# Patient Record
Sex: Female | Born: 1975
Health system: Southern US, Community
[De-identification: ages and names within clinical notes are randomized; demographics above are authoritative.]

## PROBLEM LIST (undated history)

## (undated) DIAGNOSIS — I671 Cerebral aneurysm, nonruptured: Secondary | ICD-10-CM

## (undated) DIAGNOSIS — R51 Headache: Secondary | ICD-10-CM

## (undated) DIAGNOSIS — R519 Headache, unspecified: Secondary | ICD-10-CM

## (undated) DIAGNOSIS — Z973 Presence of spectacles and contact lenses: Secondary | ICD-10-CM

## (undated) DIAGNOSIS — Z8249 Family history of ischemic heart disease and other diseases of the circulatory system: Secondary | ICD-10-CM

## (undated) DIAGNOSIS — I10 Essential (primary) hypertension: Secondary | ICD-10-CM

## (undated) HISTORY — DX: Cerebral aneurysm, nonruptured: I67.1

## (undated) HISTORY — DX: Family history of ischemic heart disease and other diseases of the circulatory system: Z82.49

## (undated) HISTORY — PX: OTHER SURGICAL HISTORY: SHX169

## (undated) HISTORY — DX: Headache, unspecified: R51.9

## (undated) HISTORY — DX: Presence of spectacles and contact lenses: Z97.3

## (undated) HISTORY — DX: Headache: R51

---

## 1998-05-29 ENCOUNTER — Emergency Department (HOSPITAL_COMMUNITY): Admission: EM | Admit: 1998-05-29 | Discharge: 1998-05-29 | Payer: Self-pay | Admitting: Emergency Medicine

## 1999-06-29 ENCOUNTER — Emergency Department (HOSPITAL_COMMUNITY): Admission: EM | Admit: 1999-06-29 | Discharge: 1999-06-29 | Payer: Self-pay | Admitting: Internal Medicine

## 2001-09-09 ENCOUNTER — Emergency Department (HOSPITAL_COMMUNITY): Admission: EM | Admit: 2001-09-09 | Discharge: 2001-09-09 | Payer: Self-pay | Admitting: Emergency Medicine

## 2001-10-10 ENCOUNTER — Emergency Department (HOSPITAL_COMMUNITY): Admission: EM | Admit: 2001-10-10 | Discharge: 2001-10-10 | Payer: Self-pay | Admitting: Emergency Medicine

## 2001-10-10 ENCOUNTER — Encounter: Payer: Self-pay | Admitting: Emergency Medicine

## 2001-12-04 ENCOUNTER — Emergency Department (HOSPITAL_COMMUNITY): Admission: EM | Admit: 2001-12-04 | Discharge: 2001-12-04 | Payer: Self-pay | Admitting: Emergency Medicine

## 2001-12-04 ENCOUNTER — Encounter: Payer: Self-pay | Admitting: Emergency Medicine

## 2005-06-13 ENCOUNTER — Emergency Department (HOSPITAL_COMMUNITY): Admission: EM | Admit: 2005-06-13 | Discharge: 2005-06-13 | Payer: Self-pay | Admitting: Emergency Medicine

## 2007-04-14 ENCOUNTER — Emergency Department (HOSPITAL_COMMUNITY): Admission: EM | Admit: 2007-04-14 | Discharge: 2007-04-14 | Payer: Self-pay | Admitting: Emergency Medicine

## 2007-04-17 ENCOUNTER — Emergency Department (HOSPITAL_COMMUNITY): Admission: EM | Admit: 2007-04-17 | Discharge: 2007-04-17 | Payer: Self-pay | Admitting: Emergency Medicine

## 2008-11-03 ENCOUNTER — Emergency Department (HOSPITAL_COMMUNITY): Admission: EM | Admit: 2008-11-03 | Discharge: 2008-11-03 | Payer: Self-pay | Admitting: Emergency Medicine

## 2009-08-16 ENCOUNTER — Emergency Department (HOSPITAL_COMMUNITY): Admission: EM | Admit: 2009-08-16 | Discharge: 2009-08-16 | Payer: Self-pay | Admitting: Emergency Medicine

## 2010-01-21 ENCOUNTER — Emergency Department (HOSPITAL_COMMUNITY)
Admission: EM | Admit: 2010-01-21 | Discharge: 2010-01-21 | Payer: Self-pay | Source: Home / Self Care | Admitting: Diagnostic Radiology

## 2011-03-24 ENCOUNTER — Encounter: Payer: Self-pay | Admitting: Emergency Medicine

## 2011-03-24 ENCOUNTER — Emergency Department (HOSPITAL_COMMUNITY)
Admission: EM | Admit: 2011-03-24 | Discharge: 2011-03-24 | Disposition: A | Discharge status: Home / Self Care | Payer: 59 | Attending: Emergency Medicine | Admitting: Emergency Medicine

## 2011-03-24 DIAGNOSIS — L0231 Cutaneous abscess of buttock: Secondary | ICD-10-CM | POA: Insufficient documentation

## 2011-03-24 DIAGNOSIS — L0291 Cutaneous abscess, unspecified: Secondary | ICD-10-CM

## 2011-03-24 MED ORDER — OXYCODONE-ACETAMINOPHEN 5-325 MG PO TABS
1.0000 | ORAL_TABLET | Freq: Four times a day (QID) | ORAL | Status: AC | PRN
Start: 2011-03-24 — End: 2011-04-03

## 2011-03-24 MED ORDER — LIDOCAINE HCL (PF) 1 % IJ SOLN: 10.0000 mL | Freq: Once | INTRAMUSCULAR | Status: DC

## 2011-03-24 MED ORDER — LIDOCAINE HCL (CARDIAC) 20 MG/ML IV SOLN: 10.0000 mL | Freq: Once | INTRAVENOUS | Status: DC

## 2011-03-24 MED ORDER — SULFAMETHOXAZOLE-TRIMETHOPRIM 800-160 MG PO TABS: 1.0000 | ORAL_TABLET | Freq: Two times a day (BID) | ORAL | Status: AC

## 2011-03-24 NOTE — ED Provider Notes (Signed)
History     CSN: [id number] Arrival date & time: 03/24/2011  7:11 PM   First MD Initiated Contact with Patient 03/24/11 2116      Chief Complaint  Patient presents with  . Abscess    (Consider location/radiation/quality/duration/timing/severity/associated sxs/prior treatment) Patient is a 35 y.o. female presenting with abscess. The history is provided by the patient.  Abscess  This is a new problem. The current episode started less than one week ago. The onset was gradual. The problem occurs continuously. The problem has been gradually worsening. The abscess is present on the left buttock. The problem is severe. The abscess is characterized by painfulness and burning. The abscess first occurred at home. Pertinent negatives include no fever. There were no sick contacts. She has received no recent medical care.    Past Medical History  Diagnosis Date  . Asthma     Past Surgical History  Procedure Date  . Arthroscopic knee     Family History  Problem Relation Age of Onset  . Hypertension Mother   . Hyperlipidemia Father   . Hypertension Other     History  Substance Use Topics  . Smoking status: Never Smoker   . Smokeless tobacco: Not on file  . Alcohol Use: Yes     social    OB History    Grav Para Term Preterm Abortions TAB SAB Ect Mult Living                  Review of Systems  Constitutional: Negative for fever.  All other systems reviewed and are negative.    Allergies  Review of patient's allergies indicates no known allergies.  Home Medications   Current Outpatient Rx  Name Route Sig Dispense Refill  . DICLOFENAC SODIUM 75 MG PO TBEC Oral Take 75 mg by mouth 2 (two) times daily as needed. For headache       BP 136/84  Pulse 110  Temp(Src) 98.3 F (36.8 C) (Oral)  Resp 18  SpO2 100%  LMP 03/05/2011  Physical Exam  Constitutional: She is oriented to person, place, and time. She appears well-developed and well-nourished. She appears  distressed.  HENT:  Head: Normocephalic and atraumatic.  Eyes: EOM are normal. Pupils are equal, round, and reactive to light.  Musculoskeletal:       Back:  Neurological: She is alert and oriented to person, place, and time.  Skin: Skin is warm and dry.    ED Course  Procedures (including critical care time)  INCISION AND DRAINAGE Performed by: Gwyneth Sprout Consent: Verbal consent obtained. Risks and benefits: risks, benefits and alternatives were discussed Type: abscess  Body area: left buttocks  Anesthesia: local infiltration  Local anesthetic: lidocaine 1% with epinephrine  Anesthetic total: 5 ml  Complexity: complex Blunt dissection to break up loculations  Drainage: purulent  Drainage amount: 6mL  Packing material: 1/4 in iodoform gauze  Patient tolerance: Patient tolerated the procedure well with no immediate complications.     No diagnosis found.    MDM   Pt with abscess of the buttocks without rectal involvement.  Denies fever no systemic sx.  No other complaints.  Abscess drained and packed.  Will treat with abx and soaks.        Gwyneth Sprout, MD 03/24/11 2220

## 2011-03-24 NOTE — ED Notes (Signed)
Pt states she has a abscess on her right buttock.  Pt states it started on Tuesday and has progressively gotten worse

## 2011-08-03 ENCOUNTER — Other Ambulatory Visit: Payer: Self-pay | Admitting: Radiology

## 2012-10-21 ENCOUNTER — Encounter (HOSPITAL_COMMUNITY): Payer: Self-pay | Admitting: *Deleted

## 2012-10-21 ENCOUNTER — Inpatient Hospital Stay (HOSPITAL_COMMUNITY)
Admission: EM | Admit: 2012-10-21 | Discharge: 2012-11-02 | DRG: 064 | Disposition: A | Discharge status: Home / Self Care | Payer: 59 | Attending: Neurosurgery | Admitting: Neurosurgery

## 2012-10-21 DIAGNOSIS — J4520 Mild intermittent asthma, uncomplicated: Secondary | ICD-10-CM

## 2012-10-21 DIAGNOSIS — Z7982 Long term (current) use of aspirin: Secondary | ICD-10-CM

## 2012-10-21 DIAGNOSIS — R51 Headache: Secondary | ICD-10-CM | POA: Diagnosis present

## 2012-10-21 DIAGNOSIS — E874 Mixed disorder of acid-base balance: Secondary | ICD-10-CM | POA: Diagnosis present

## 2012-10-21 DIAGNOSIS — E872 Acidosis, unspecified: Secondary | ICD-10-CM

## 2012-10-21 DIAGNOSIS — E8729 Other acidosis: Secondary | ICD-10-CM | POA: Diagnosis present

## 2012-10-21 DIAGNOSIS — J45909 Unspecified asthma, uncomplicated: Secondary | ICD-10-CM | POA: Diagnosis present

## 2012-10-21 DIAGNOSIS — R519 Headache, unspecified: Secondary | ICD-10-CM | POA: Diagnosis present

## 2012-10-21 DIAGNOSIS — R7309 Other abnormal glucose: Secondary | ICD-10-CM | POA: Diagnosis present

## 2012-10-21 DIAGNOSIS — I609 Nontraumatic subarachnoid hemorrhage, unspecified: Principal | ICD-10-CM

## 2012-10-21 DIAGNOSIS — J95821 Acute postprocedural respiratory failure: Secondary | ICD-10-CM

## 2012-10-21 DIAGNOSIS — R58 Hemorrhage, not elsewhere classified: Secondary | ICD-10-CM

## 2012-10-21 DIAGNOSIS — E871 Hypo-osmolality and hyponatremia: Secondary | ICD-10-CM | POA: Diagnosis present

## 2012-10-21 DIAGNOSIS — H53149 Visual discomfort, unspecified: Secondary | ICD-10-CM | POA: Diagnosis present

## 2012-10-21 DIAGNOSIS — R739 Hyperglycemia, unspecified: Secondary | ICD-10-CM

## 2012-10-21 DIAGNOSIS — E876 Hypokalemia: Secondary | ICD-10-CM

## 2012-10-21 LAB — CG4 I-STAT (LACTIC ACID): Lactic Acid, Venous: 4.45 mmol/L — ABNORMAL HIGH (ref 0.5–2.2)

## 2012-10-21 LAB — COMPREHENSIVE METABOLIC PANEL
ALT: 27 U/L (ref 0–35)
AST: 22 U/L (ref 0–37)
Calcium: 10.1 mg/dL (ref 8.4–10.5)
Creatinine, Ser: 0.72 mg/dL (ref 0.50–1.10)
GFR calc Af Amer: >90 mL/min (ref >90)
Glucose, Bld: 177 mg/dL — ABNORMAL HIGH (ref 70–99)
Sodium: 134 mEq/L — ABNORMAL LOW (ref 135–145)
Total Protein: 8.3 g/dL (ref 6.0–8.3)

## 2012-10-21 LAB — CBC WITH DIFFERENTIAL/PLATELET
Basophils Absolute: 0 10*3/uL (ref 0.0–0.1)
Eosinophils Absolute: 0.2 10*3/uL (ref 0.0–0.7)
Eosinophils Relative: 2 % (ref 0–5)
MCH: 29.2 pg (ref 26.0–34.0)
MCV: 86.7 fL (ref 78.0–100.0)
Platelets: 269 10*3/uL (ref 150–400)
RDW: 13.5 % (ref 11.5–15.5)

## 2012-10-21 MED ORDER — SODIUM CHLORIDE 0.9 % IV SOLN
1000.0000 mL | INTRAVENOUS | Status: DC
  Administered 2012-10-21 – 2012-10-22 (×2): 1000 mL via INTRAVENOUS

## 2012-10-21 MED ORDER — ONDANSETRON 8 MG PO TBDP
8.0000 mg | ORAL_TABLET | Freq: Once | ORAL | Status: AC
  Administered 2012-10-21: 8 mg via ORAL
  Filled 2012-10-21: qty 1

## 2012-10-21 MED ORDER — POTASSIUM CHLORIDE 10 MEQ/100ML IV SOLN
10.0000 meq | Freq: Once | INTRAVENOUS | Status: AC
  Administered 2012-10-21: 10 meq via INTRAVENOUS
  Filled 2012-10-21: qty 100

## 2012-10-21 MED ORDER — SODIUM CHLORIDE 0.9 % IV SOLN
1000.0000 mL | Freq: Once | INTRAVENOUS | Status: AC
  Administered 2012-10-21: 1000 mL via INTRAVENOUS

## 2012-10-21 MED ORDER — FENTANYL CITRATE 0.05 MG/ML IJ SOLN
50.0000 ug | Freq: Once | INTRAMUSCULAR | Status: AC
  Administered 2012-10-21: 50 ug via NASAL
  Filled 2012-10-21: qty 2

## 2012-10-21 MED ORDER — DIPHENHYDRAMINE HCL 50 MG/ML IJ SOLN
25.0000 mg | Freq: Once | INTRAMUSCULAR | Status: AC
  Administered 2012-10-21: 25 mg via INTRAVENOUS
  Filled 2012-10-21: qty 1

## 2012-10-21 MED ORDER — METOCLOPRAMIDE HCL 5 MG/ML IJ SOLN
10.0000 mg | Freq: Once | INTRAMUSCULAR | Status: AC
  Administered 2012-10-21: 10 mg via INTRAVENOUS
  Filled 2012-10-21: qty 2

## 2012-10-21 NOTE — ED Notes (Signed)
Pt report sudden onset of headache that began around 1930 this evening; pt states that it is a sharp pain in the back of her neck that radiates through her eyes; pt denies hx of migraines; pt hyperventilating upon RN assessment; pt advised of slow even breaths and not to hyperventilate; pt c/o feeling dizzy and blurry vision; pt c/o N/V

## 2012-10-21 NOTE — ED Provider Notes (Signed)
History     CSN: [id number]  Arrival date & time 10/21/12  2044   First MD Initiated Contact with Patient 10/21/12 2251      Chief Complaint  Patient presents with  . Headache    (Consider location/radiation/quality/duration/timing/severity/associated sxs/prior treatment) Patient is a 37 y.o. female presenting with headaches. The history is provided by the patient.  Headache She had onset at about 7:30 PM of a sharp pain in the back of her neck which radiated up through her head. Pain does wax and wane. It is severe and she rates it at 9/10. Nothing makes it better nothing makes it worse. She denies photophobia or phonophobia. She denies blurry or double vision. She has noted that she's feeling lightheaded and mouth is dry. She denies weakness or numbness or tingling. She's never had a headache like this before. She has not had anything to try and treat it.  Past Medical History  Diagnosis Date  . Asthma     Past Surgical History  Procedure Laterality Date  . Arthroscopic knee      Family History  Problem Relation Age of Onset  . Hypertension Mother   . Hyperlipidemia Father   . Hypertension Other     History  Substance Use Topics  . Smoking status: Never Smoker   . Smokeless tobacco: Not on file  . Alcohol Use: Yes     Comment: social    OB History   Grav Para Term Preterm Abortions TAB SAB Ect Mult Living                  Review of Systems  Neurological: Positive for headaches.  All other systems reviewed and are negative.    Allergies  Review of patient's allergies indicates no known allergies.  Home Medications  No current outpatient prescriptions on file.  BP 151/103  Pulse 92  Temp(Src) 97.4 F (36.3 C) (Oral)  Resp 28  SpO2 100%  LMP 10/20/2012  Physical Exam  Nursing note and vitals reviewed.  37 year old female, who appears uncomfortable, but is in no acute distress. She appears to be hyperventilating. Vital signs are significant for  tachypnea with respiratory rate of 28, and hypertension with blood pressure 151/103. Oxygen saturation is 100%, which is normal. Head is normocephalic and atraumatic. PERRLA, EOMI. Oropharynx is clear. Fundi show no hemorrhage, exudate, or papilledema. Neck is moderately tender diffusely in the posterior aspect with moderate to severe bilateral paracervical spasm. In spite of this, it is she's able to put the neck through full range of motion. Back is nontender and there is no CVA tenderness. Lungs are clear without rales, wheezes, or rhonchi. Chest is nontender. Heart has regular rate and rhythm without murmur. Abdomen is soft, flat, nontender without masses or hepatosplenomegaly and peristalsis is normoactive. Extremities have no cyanosis or edema, full range of motion is present. Skin is warm and dry without rash. Neurologic: Mental status is normal, cranial nerves are intact, there are no motor or sensory deficits.  ED Course  Procedures (including critical care time)  Results for orders placed during the hospital encounter of 10/21/12  CBC WITH DIFFERENTIAL      Result Value Range   WBC 10.3  4.0 - 10.5 K/uL   RBC 4.52  3.87 - 5.11 MIL/uL   Hemoglobin 13.2  12.0 - 15.0 g/dL   HCT 28.4  13.2 - 44.0 %   MCV 86.7  78.0 - 100.0 fL   MCH 29.2  26.0 -  34.0 pg   MCHC 33.7  30.0 - 36.0 g/dL   RDW 40.9  81.1 - 91.4 %   Platelets 269  150 - 400 K/uL   Neutrophils Relative % 45  43 - 77 %   Neutro Abs 4.6  1.7 - 7.7 K/uL   Lymphocytes Relative 46  12 - 46 %   Lymphs Abs 4.7 (*) 0.7 - 4.0 K/uL   Monocytes Relative 8  3 - 12 %   Monocytes Absolute 0.8  0.1 - 1.0 K/uL   Eosinophils Relative 2  0 - 5 %   Eosinophils Absolute 0.2  0.0 - 0.7 K/uL   Basophils Relative 0  0 - 1 %   Basophils Absolute 0.0  0.0 - 0.1 K/uL  COMPREHENSIVE METABOLIC PANEL      Result Value Range   Sodium 134 (*) 135 - 145 mEq/L   Potassium 2.9 (*) 3.5 - 5.1 mEq/L   Chloride 98  96 - 112 mEq/L   CO2 17 (*) 19 -  32 mEq/L   Glucose, Bld 177 (*) 70 - 99 mg/dL   BUN 10  6 - 23 mg/dL   Creatinine, Ser 7.82  0.50 - 1.10 mg/dL   Calcium 95.6  8.4 - 21.3 mg/dL   Total Protein 8.3  6.0 - 8.3 g/dL   Albumin 4.2  3.5 - 5.2 g/dL   AST 22  0 - 37 U/L   ALT 27  0 - 35 U/L   Alkaline Phosphatase 85  39 - 117 U/L   Total Bilirubin 0.2 (*) 0.3 - 1.2 mg/dL   GFR calc non Af Amer >90  >90 mL/min   GFR calc Af Amer >90  >90 mL/min  ACETAMINOPHEN LEVEL      Result Value Range   Acetaminophen (Tylenol), Serum <15.0  10 - 30 ug/mL  SALICYLATE LEVEL      Result Value Range   Salicylate Lvl 2.3 (*) 2.8 - 20.0 mg/dL  URINALYSIS, ROUTINE W REFLEX MICROSCOPIC      Result Value Range   Color, Urine YELLOW  YELLOW   APPearance HAZY (*) CLEAR   Specific Gravity, Urine 1.011  1.005 - 1.030   pH 7.5  5.0 - 8.0   Glucose, UA NEGATIVE  NEGATIVE mg/dL   Hgb urine dipstick LARGE (*) NEGATIVE   Bilirubin Urine NEGATIVE  NEGATIVE   Ketones, ur NEGATIVE  NEGATIVE mg/dL   Protein, ur NEGATIVE  NEGATIVE mg/dL   Urobilinogen, UA 0.2  0.0 - 1.0 mg/dL   Nitrite NEGATIVE  NEGATIVE   Leukocytes, UA NEGATIVE  NEGATIVE  URINE MICROSCOPIC-ADD ON      Result Value Range   Squamous Epithelial / LPF RARE  RARE   WBC, UA 0-2  <3 WBC/hpf   RBC / HPF TOO NUMEROUS TO COUNT  <3 RBC/hpf   Bacteria, UA RARE  RARE   Urine-Other TRICHOMONAS PRESENT    CG4 I-STAT (LACTIC ACID)      Result Value Range   Lactic Acid, Venous 4.45 (*) 0.5 - 2.2 mmol/L  POCT PREGNANCY, URINE      Result Value Range   Preg Test, Ur NEGATIVE  NEGATIVE  CG4 I-STAT (LACTIC ACID)      Result Value Range   Lactic Acid, Venous 3.96 (*) 0.5 - 2.2 mmol/L    1. Lactic acidosis   2. Hyperglycemia   3. Headache   4. Hypokalemia       MDM  Neck pain and headache of uncertain  cause. She will be given a therapeutic trial of a headache cocktail. Laboratory workup has been ordered prior to my seeing the patient and has come back significant for hypokalemia with  potassium 2.9, metabolic acidosis with CO2 of 17, borderline hyponatremia with sodium of 134. Anion gap is elevated at 19. Hyperglycemia is also noted to with blood sugar 177. Causes for her metabolic abnormalities is not clear and additional testing has been initiated. Potassium will be given intravenously.  12:20 AM Headache is significantly improved. Lactic acid has come back elevated. Reason for lactic acidosis is not clear, but elevated lactic acid is sufficient to account for abnormalities and an anion gap. She received a 1 L bolus of normal saline as part of the headache cocktail and she will be given a second 1 L bolus. Of note, she is not febrile, does not appear toxic and this does not appear to be part of a sepsis picture.   2:45 AM Headache is starting to recur and nausea starting to recur. She'll be given ketorolac and ondansetron. Lactic acid level be repeated to see if it has improved with IV hydration.   Repeat lactic acid level has shown only slight drop. She will be admitted for investigation of the cause of her lactic acidosis. Case is discussed with Dr.Garg of [facility] who agrees to admit the patient under observation status.   Dione Booze, MD 10/22/12 [phone]

## 2012-10-22 ENCOUNTER — Inpatient Hospital Stay (HOSPITAL_COMMUNITY): Payer: 59

## 2012-10-22 ENCOUNTER — Inpatient Hospital Stay (HOSPITAL_COMMUNITY): Payer: 59 | Admitting: Anesthesiology

## 2012-10-22 ENCOUNTER — Encounter (HOSPITAL_COMMUNITY): Payer: Self-pay | Admitting: Anesthesiology

## 2012-10-22 ENCOUNTER — Encounter (HOSPITAL_COMMUNITY): Payer: Self-pay | Admitting: Radiology

## 2012-10-22 ENCOUNTER — Observation Stay (HOSPITAL_COMMUNITY): Payer: 59

## 2012-10-22 DIAGNOSIS — J95821 Acute postprocedural respiratory failure: Secondary | ICD-10-CM

## 2012-10-22 DIAGNOSIS — E876 Hypokalemia: Secondary | ICD-10-CM

## 2012-10-22 DIAGNOSIS — J45909 Unspecified asthma, uncomplicated: Secondary | ICD-10-CM

## 2012-10-22 DIAGNOSIS — I609 Nontraumatic subarachnoid hemorrhage, unspecified: Principal | ICD-10-CM | POA: Diagnosis present

## 2012-10-22 DIAGNOSIS — E872 Acidosis, unspecified: Secondary | ICD-10-CM

## 2012-10-22 DIAGNOSIS — R519 Headache, unspecified: Secondary | ICD-10-CM | POA: Diagnosis present

## 2012-10-22 DIAGNOSIS — R7309 Other abnormal glucose: Secondary | ICD-10-CM

## 2012-10-22 DIAGNOSIS — E8729 Other acidosis: Secondary | ICD-10-CM | POA: Diagnosis present

## 2012-10-22 LAB — URINALYSIS, ROUTINE W REFLEX MICROSCOPIC
Bilirubin Urine: NEGATIVE
Glucose, UA: NEGATIVE mg/dL
Hgb urine dipstick: NEGATIVE
Ketones, ur: NEGATIVE mg/dL
Leukocytes, UA: NEGATIVE
Nitrite: NEGATIVE
Protein, ur: NEGATIVE mg/dL
Protein, ur: NEGATIVE mg/dL
Urobilinogen, UA: 0.2 mg/dL (ref 0.0–1.0)

## 2012-10-22 LAB — URINE MICROSCOPIC-ADD ON

## 2012-10-22 LAB — BASIC METABOLIC PANEL
CO2: 21 mEq/L (ref 19–32)
Chloride: 109 mEq/L (ref 96–112)
Glucose, Bld: 123 mg/dL — ABNORMAL HIGH (ref 70–99)
Sodium: 139 mEq/L (ref 135–145)

## 2012-10-22 LAB — GLUCOSE, CAPILLARY: Glucose-Capillary: 119 mg/dL — ABNORMAL HIGH (ref 70–99)

## 2012-10-22 LAB — SALICYLATE LEVEL: Salicylate Lvl: 2.3 mg/dL — ABNORMAL LOW (ref 2.8–20.0)

## 2012-10-22 LAB — MRSA PCR SCREENING: MRSA by PCR: NEGATIVE

## 2012-10-22 LAB — MAGNESIUM: Magnesium: 2 mg/dL (ref 1.5–2.5)

## 2012-10-22 LAB — PROTIME-INR
INR: 1 (ref 0.00–1.49)
Prothrombin Time: 13.1 seconds (ref 11.6–15.2)

## 2012-10-22 LAB — PHOSPHORUS: Phosphorus: 2.2 mg/dL — ABNORMAL LOW (ref 2.3–4.6)

## 2012-10-22 MED ORDER — FENTANYL CITRATE 0.05 MG/ML IJ SOLN
INTRAMUSCULAR | Status: DC | PRN
  Administered 2012-10-22: 100 ug via INTRAVENOUS
  Administered 2012-10-22: 125 ug via INTRAVENOUS
  Administered 2012-10-22: 100 ug via INTRAVENOUS
  Administered 2012-10-22: 25 ug via INTRAVENOUS
  Administered 2012-10-22: 50 ug via INTRAVENOUS
  Administered 2012-10-22: 75 ug via INTRAVENOUS

## 2012-10-22 MED ORDER — MORPHINE SULFATE 2 MG/ML IJ SOLN: 2.0000 mg | INTRAMUSCULAR | Status: DC | PRN

## 2012-10-22 MED ORDER — ACETAMINOPHEN 500 MG PO TABS
1000.0000 mg | ORAL_TABLET | Freq: Four times a day (QID) | ORAL | Status: DC | PRN
  Administered 2012-10-24 – 2012-10-25 (×2): 1000 mg via ORAL
  Filled 2012-10-22 (×4): qty 2

## 2012-10-22 MED ORDER — HYDROMORPHONE HCL PF 1 MG/ML IJ SOLN: 0.2500 mg | INTRAMUSCULAR | Status: DC | PRN

## 2012-10-22 MED ORDER — PROPOFOL 10 MG/ML IV BOLUS
INTRAVENOUS | Status: DC | PRN
  Administered 2012-10-22: 200 mg via INTRAVENOUS

## 2012-10-22 MED ORDER — LIDOCAINE HCL (CARDIAC) 20 MG/ML IV SOLN
INTRAVENOUS | Status: DC | PRN
  Administered 2012-10-22: 100 mg via INTRAVENOUS

## 2012-10-22 MED ORDER — ONDANSETRON HCL 4 MG/2ML IJ SOLN: 4.0000 mg | Freq: Three times a day (TID) | INTRAMUSCULAR | Status: DC | PRN

## 2012-10-22 MED ORDER — ONDANSETRON HCL 4 MG/2ML IJ SOLN
4.0000 mg | Freq: Four times a day (QID) | INTRAMUSCULAR | Status: DC | PRN
  Administered 2012-10-23 – 2012-10-30 (×13): 4 mg via INTRAVENOUS
  Filled 2012-10-22 (×13): qty 2

## 2012-10-22 MED ORDER — ARTIFICIAL TEARS OP OINT
TOPICAL_OINTMENT | OPHTHALMIC | Status: DC | PRN
  Administered 2012-10-22: 1 via OPHTHALMIC

## 2012-10-22 MED ORDER — NIMODIPINE 60 MG/20ML PO SOLN
60.0000 mg | ORAL | Status: DC
  Administered 2012-10-22 – 2012-11-02 (×62): 60 mg
  Filled 2012-10-22 (×75): qty 20

## 2012-10-22 MED ORDER — SENNOSIDES-DOCUSATE SODIUM 8.6-50 MG PO TABS
1.0000 | ORAL_TABLET | Freq: Two times a day (BID) | ORAL | Status: DC
  Administered 2012-10-22 – 2012-10-31 (×4): 1 via ORAL
  Filled 2012-10-22 (×19): qty 1

## 2012-10-22 MED ORDER — HYDRALAZINE HCL 20 MG/ML IJ SOLN
INTRAMUSCULAR | Status: AC
  Filled 2012-10-22: qty 1

## 2012-10-22 MED ORDER — ONDANSETRON HCL 4 MG/2ML IJ SOLN: 4.0000 mg | Freq: Once | INTRAMUSCULAR | Status: AC | PRN

## 2012-10-22 MED ORDER — MIDAZOLAM HCL 2 MG/2ML IJ SOLN
INTRAMUSCULAR | Status: AC | PRN
  Administered 2012-10-22: 1 mg via INTRAVENOUS

## 2012-10-22 MED ORDER — POTASSIUM CHLORIDE IN NACL 20-0.9 MEQ/L-% IV SOLN
INTRAVENOUS | Status: DC
  Administered 2012-10-22 – 2012-10-25 (×5): via INTRAVENOUS
  Administered 2012-10-26: 1000 mL via INTRAVENOUS
  Administered 2012-10-26 – 2012-11-01 (×7): via INTRAVENOUS
  Filled 2012-10-22 (×47): qty 1000

## 2012-10-22 MED ORDER — SODIUM CHLORIDE 0.9 % IV SOLN: INTRAVENOUS | Status: DC

## 2012-10-22 MED ORDER — CHLORHEXIDINE GLUCONATE 0.12 % MT SOLN
15.0000 mL | Freq: Two times a day (BID) | OROMUCOSAL | Status: DC
  Administered 2012-10-22 – 2012-10-24 (×4): 15 mL via OROMUCOSAL
  Filled 2012-10-22 (×3): qty 15

## 2012-10-22 MED ORDER — SODIUM CHLORIDE 0.9 % IV SOLN
25.0000 ug/h | INTRAVENOUS | Status: DC
  Administered 2012-10-22: 50 ug/h via INTRAVENOUS
  Filled 2012-10-22 (×2): qty 50

## 2012-10-22 MED ORDER — IOHEXOL 300 MG/ML  SOLN
150.0000 mL | Freq: Once | INTRAMUSCULAR | Status: AC | PRN
  Administered 2012-10-22: 160 mL via INTRA_ARTERIAL

## 2012-10-22 MED ORDER — ACETAMINOPHEN 650 MG RE SUPP: 650.0000 mg | RECTAL | Status: DC | PRN

## 2012-10-22 MED ORDER — CEFAZOLIN SODIUM-DEXTROSE 2-3 GM-% IV SOLR
2.0000 g | Freq: Once | INTRAVENOUS | Status: AC
  Administered 2012-10-22: 2 g via INTRAVENOUS

## 2012-10-22 MED ORDER — SODIUM CHLORIDE 0.9 % IJ SOLN: 3.0000 mL | INTRAMUSCULAR | Status: DC | PRN

## 2012-10-22 MED ORDER — NIMODIPINE 30 MG PO CAPS
60.0000 mg | ORAL_CAPSULE | ORAL | Status: DC
  Administered 2012-11-01 (×2): 60 mg via ORAL
  Filled 2012-10-22 (×75): qty 2

## 2012-10-22 MED ORDER — LACTATED RINGERS IV SOLN
INTRAVENOUS | Status: DC | PRN
  Administered 2012-10-22 (×2): via INTRAVENOUS

## 2012-10-22 MED ORDER — CLOPIDOGREL BISULFATE 75 MG PO TABS
75.0000 mg | ORAL_TABLET | Freq: Every day | ORAL | Status: DC
  Administered 2012-10-23 – 2012-11-02 (×11): 75 mg via ORAL
  Filled 2012-10-22 (×14): qty 1

## 2012-10-22 MED ORDER — ACETAMINOPHEN 325 MG PO TABS: 650.0000 mg | ORAL_TABLET | ORAL | Status: DC | PRN

## 2012-10-22 MED ORDER — CLOPIDOGREL BISULFATE 300 MG PO TABS
300.0000 mg | ORAL_TABLET | Freq: Once | ORAL | Status: AC
  Administered 2012-10-22: 300 mg via ORAL
  Filled 2012-10-22: qty 1

## 2012-10-22 MED ORDER — SUCCINYLCHOLINE CHLORIDE 20 MG/ML IJ SOLN
INTRAMUSCULAR | Status: DC | PRN
  Administered 2012-10-22: 100 mg via INTRAVENOUS

## 2012-10-22 MED ORDER — PROPOFOL 10 MG/ML IV EMUL
5.0000 ug/kg/min | INTRAVENOUS | Status: DC
  Administered 2012-10-22: 50 ug/kg/min via INTRAVENOUS
  Administered 2012-10-22: 75 ug/kg/min via INTRAVENOUS
  Administered 2012-10-23: 50 ug/kg/min via INTRAVENOUS
  Filled 2012-10-22 (×5): qty 100

## 2012-10-22 MED ORDER — SODIUM CHLORIDE 0.9 % IJ SOLN: 3.0000 mL | Freq: Two times a day (BID) | INTRAMUSCULAR | Status: DC

## 2012-10-22 MED ORDER — PANTOPRAZOLE SODIUM 40 MG IV SOLR
40.0000 mg | Freq: Every day | INTRAVENOUS | Status: DC
  Administered 2012-10-22 – 2012-10-25 (×4): 40 mg via INTRAVENOUS
  Filled 2012-10-22 (×5): qty 40

## 2012-10-22 MED ORDER — HYDROMORPHONE HCL PF 1 MG/ML IJ SOLN
0.5000 mg | INTRAMUSCULAR | Status: DC | PRN
  Administered 2012-10-22 – 2012-10-24 (×7): 0.5 mg via INTRAVENOUS
  Administered 2012-10-24: 09:00:00 via INTRAVENOUS
  Administered 2012-10-24 – 2012-10-28 (×25): 0.5 mg via INTRAVENOUS
  Administered 2012-10-29: 08:00:00 via INTRAVENOUS
  Administered 2012-10-29 (×2): 0.5 mg via INTRAVENOUS
  Administered 2012-10-29: 18:00:00 via INTRAVENOUS
  Administered 2012-10-29 – 2012-11-01 (×17): 0.5 mg via INTRAVENOUS
  Filled 2012-10-22 (×53): qty 1

## 2012-10-22 MED ORDER — EPTIFIBATIDE 2 MG/ML IV SOLN
INTRAVENOUS | Status: AC
  Administered 2012-10-22: 9.6 mg
  Filled 2012-10-22: qty 10

## 2012-10-22 MED ORDER — NICARDIPINE HCL IN NACL 20-0.86 MG/200ML-% IV SOLN
5.0000 mg/h | INTRAVENOUS | Status: DC
  Administered 2012-10-23: 5 mg/h via INTRAVENOUS
  Administered 2012-10-23: 7 mg/h via INTRAVENOUS
  Administered 2012-10-23 (×3): 5 mg/h via INTRAVENOUS
  Administered 2012-10-23: 7 mg/h via INTRAVENOUS
  Administered 2012-10-24: 5 mg/h via INTRAVENOUS
  Filled 2012-10-22 (×7): qty 200

## 2012-10-22 MED ORDER — FENTANYL CITRATE 0.05 MG/ML IJ SOLN
INTRAMUSCULAR | Status: AC | PRN
  Administered 2012-10-22: 25 ug via INTRAVENOUS

## 2012-10-22 MED ORDER — PHENYLEPHRINE HCL 10 MG/ML IJ SOLN
10.0000 mg | INTRAVENOUS | Status: DC | PRN
  Administered 2012-10-22: 10 ug/min via INTRAVENOUS

## 2012-10-22 MED ORDER — HYDROMORPHONE HCL PF 1 MG/ML IJ SOLN
1.0000 mg | INTRAMUSCULAR | Status: DC | PRN
  Administered 2012-10-22: 1 mg via INTRAVENOUS
  Filled 2012-10-22: qty 1

## 2012-10-22 MED ORDER — SODIUM CHLORIDE 0.9 % IV SOLN
500.0000 mg | Freq: Two times a day (BID) | INTRAVENOUS | Status: DC
  Administered 2012-10-22 – 2012-11-01 (×21): 500 mg via INTRAVENOUS
  Filled 2012-10-22 (×24): qty 5

## 2012-10-22 MED ORDER — HEPARIN (PORCINE) IN NACL 100-0.45 UNIT/ML-% IJ SOLN
700.0000 [IU]/h | INTRAMUSCULAR | Status: AC
  Administered 2012-10-22: 500 [IU]/h via INTRAVENOUS
  Filled 2012-10-22 (×2): qty 250

## 2012-10-22 MED ORDER — POTASSIUM CHLORIDE 10 MEQ/100ML IV SOLN
10.0000 meq | Freq: Once | INTRAVENOUS | Status: AC
  Administered 2012-10-22: 10 meq via INTRAVENOUS
  Filled 2012-10-22: qty 100

## 2012-10-22 MED ORDER — PROPOFOL INFUSION 10 MG/ML OPTIME
INTRAVENOUS | Status: DC | PRN
  Administered 2012-10-22: 50 ug/kg/min via INTRAVENOUS

## 2012-10-22 MED ORDER — MIDAZOLAM HCL 2 MG/2ML IJ SOLN
INTRAMUSCULAR | Status: AC
  Filled 2012-10-22: qty 2

## 2012-10-22 MED ORDER — FENTANYL BOLUS VIA INFUSION
25.0000 ug | Freq: Four times a day (QID) | INTRAVENOUS | Status: DC | PRN
  Filled 2012-10-22: qty 100

## 2012-10-22 MED ORDER — INSULIN ASPART 100 UNIT/ML ~~LOC~~ SOLN: 0.0000 [IU] | SUBCUTANEOUS | Status: DC

## 2012-10-22 MED ORDER — ASPIRIN 325 MG PO TABS
650.0000 mg | ORAL_TABLET | Freq: Once | ORAL | Status: DC
  Filled 2012-10-22: qty 2

## 2012-10-22 MED ORDER — ASPIRIN 325 MG PO TABS
650.0000 mg | ORAL_TABLET | Freq: Once | ORAL | Status: AC
  Administered 2012-10-22: 650 mg via ORAL
  Filled 2012-10-22: qty 2

## 2012-10-22 MED ORDER — PROPOFOL 10 MG/ML IV EMUL
INTRAVENOUS | Status: AC
  Administered 2012-10-22: 1000 mg
  Filled 2012-10-22: qty 100

## 2012-10-22 MED ORDER — ACETAMINOPHEN-CODEINE #3 300-30 MG PO TABS
1.0000 | ORAL_TABLET | ORAL | Status: DC | PRN
  Administered 2012-10-24: 2 via ORAL
  Administered 2012-10-24: 1 via ORAL
  Administered 2012-10-25 – 2012-10-26 (×5): 2 via ORAL
  Filled 2012-10-22 (×4): qty 2
  Filled 2012-10-22: qty 1
  Filled 2012-10-22 (×3): qty 2

## 2012-10-22 MED ORDER — ONDANSETRON HCL 4 MG/2ML IJ SOLN
4.0000 mg | Freq: Once | INTRAMUSCULAR | Status: AC
  Administered 2012-10-22: 4 mg via INTRAVENOUS
  Filled 2012-10-22: qty 2

## 2012-10-22 MED ORDER — LABETALOL HCL 5 MG/ML IV SOLN
10.0000 mg | INTRAVENOUS | Status: DC | PRN
  Administered 2012-10-22: 20 mg via INTRAVENOUS
  Filled 2012-10-22 (×2): qty 4

## 2012-10-22 MED ORDER — FENTANYL CITRATE 0.05 MG/ML IJ SOLN
INTRAMUSCULAR | Status: AC
Start: 2012-10-22 — End: 2012-10-22
  Filled 2012-10-22: qty 2

## 2012-10-22 MED ORDER — ALBUTEROL SULFATE HFA 108 (90 BASE) MCG/ACT IN AERS
2.0000 | INHALATION_SPRAY | Freq: Four times a day (QID) | RESPIRATORY_TRACT | Status: DC
  Administered 2012-10-22 – 2012-10-24 (×9): 2 via RESPIRATORY_TRACT
  Filled 2012-10-22: qty 6.7

## 2012-10-22 MED ORDER — CLOPIDOGREL BISULFATE 300 MG PO TABS
300.0000 mg | ORAL_TABLET | Freq: Once | ORAL | Status: DC
  Filled 2012-10-22: qty 1

## 2012-10-22 MED ORDER — SODIUM CHLORIDE 0.9 % IV BOLUS (SEPSIS)
1000.0000 mL | Freq: Once | INTRAVENOUS | Status: AC
  Administered 2012-10-22: 1000 mL via INTRAVENOUS

## 2012-10-22 MED ORDER — ASPIRIN 325 MG PO TABS
325.0000 mg | ORAL_TABLET | Freq: Every day | ORAL | Status: DC
  Administered 2012-10-23 – 2012-11-02 (×11): 325 mg via ORAL
  Filled 2012-10-22 (×15): qty 1

## 2012-10-22 MED ORDER — ROCURONIUM BROMIDE 100 MG/10ML IV SOLN
INTRAVENOUS | Status: DC | PRN
  Administered 2012-10-22: 20 mg via INTRAVENOUS
  Administered 2012-10-22: 10 mg via INTRAVENOUS
  Administered 2012-10-22: 50 mg via INTRAVENOUS
  Administered 2012-10-22 (×2): 20 mg via INTRAVENOUS

## 2012-10-22 MED ORDER — SODIUM CHLORIDE 0.9 % IJ SOLN
1.5000 mg | INTRAVENOUS | Status: AC
  Filled 2012-10-22: qty 0.3

## 2012-10-22 MED ORDER — NICARDIPINE HCL IN NACL 20-0.86 MG/200ML-% IV SOLN: 5.0000 mg/h | INTRAVENOUS | Status: DC

## 2012-10-22 MED ORDER — HYDRALAZINE HCL 20 MG/ML IJ SOLN
INTRAMUSCULAR | Status: AC | PRN
  Administered 2012-10-22 (×2): 5 mg via INTRAVENOUS

## 2012-10-22 MED ORDER — NICARDIPINE HCL IN NACL 20-0.86 MG/200ML-% IV SOLN
5.0000 mg/h | INTRAVENOUS | Status: DC
  Administered 2012-10-22: 5 mg/h via INTRAVENOUS
  Filled 2012-10-22 (×2): qty 200

## 2012-10-22 MED ORDER — CEFAZOLIN SODIUM-DEXTROSE 2-3 GM-% IV SOLR
INTRAVENOUS | Status: AC
  Filled 2012-10-22: qty 50

## 2012-10-22 MED ORDER — HEPARIN SODIUM (PORCINE) 1000 UNIT/ML IJ SOLN
INTRAMUSCULAR | Status: DC | PRN
  Administered 2012-10-22 (×2): 1000 [IU] via INTRAVENOUS
  Administered 2012-10-22: 500 [IU] via INTRAVENOUS
  Administered 2012-10-22: 1000 [IU] via INTRAVENOUS

## 2012-10-22 MED ORDER — SODIUM CHLORIDE 0.9 % IV SOLN: 250.0000 mL | INTRAVENOUS | Status: DC | PRN

## 2012-10-22 MED ORDER — ONDANSETRON HCL 4 MG/2ML IJ SOLN
INTRAMUSCULAR | Status: AC
  Administered 2012-10-22: 4 mg
  Filled 2012-10-22: qty 2

## 2012-10-22 MED ORDER — IOHEXOL 300 MG/ML  SOLN
150.0000 mL | Freq: Once | INTRAMUSCULAR | Status: AC | PRN
  Administered 2012-10-22: 80 mL via INTRA_ARTERIAL

## 2012-10-22 MED ORDER — KETOROLAC TROMETHAMINE 30 MG/ML IJ SOLN
30.0000 mg | Freq: Once | INTRAMUSCULAR | Status: AC
  Administered 2012-10-22: 30 mg via INTRAVENOUS
  Filled 2012-10-22: qty 1

## 2012-10-22 MED ORDER — BIOTENE DRY MOUTH MT LIQD
15.0000 mL | Freq: Four times a day (QID) | OROMUCOSAL | Status: DC
  Administered 2012-10-23 – 2012-10-25 (×8): 15 mL via OROMUCOSAL

## 2012-10-22 MED ORDER — NICARDIPINE HCL IN NACL 20-0.86 MG/200ML-% IV SOLN
INTRAVENOUS | Status: DC | PRN
  Administered 2012-10-22: 3.75 mg/h via INTRAVENOUS
  Administered 2012-10-22 (×2): 7.5 mg/h via INTRAVENOUS

## 2012-10-22 MED ORDER — EPTIFIBATIDE 2 MG/ML IV SOLN
2.0000 mg | Freq: Once | INTRAVENOUS | Status: DC
  Filled 2012-10-22: qty 10

## 2012-10-22 MED ORDER — ACETAMINOPHEN 650 MG RE SUPP: 650.0000 mg | Freq: Four times a day (QID) | RECTAL | Status: DC | PRN

## 2012-10-22 NOTE — ED Notes (Signed)
Dr Danielle Dess here reviewing films with DR D.

## 2012-10-22 NOTE — ED Notes (Signed)
[facility] was called and was made aware of pt's transfer need to [facility].

## 2012-10-22 NOTE — ED Notes (Signed)
Preston Fleeting EDP made aware of CG4 I-Stat results.

## 2012-10-22 NOTE — ED Notes (Signed)
Transfer to bed fopr transport back to 3100 to prepare for pipeline later today.

## 2012-10-22 NOTE — Consult Note (Signed)
PULMONARY  / CRITICAL CARE MEDICINE  Name: Anita CISSELL MRN: [id number] DOB: Feb 27, 1976    ADMISSION DATE:  10/21/2012 CONSULTATION DATE:  10/22/12  REFERRING MD :  Allie Dimmer / Pool PRIMARY SERVICE: Pool  CHIEF COMPLAINT:  Acute resp failure, SAH  BRIEF PATIENT DESCRIPTION: 37 yr old controlled asthma, ICA aneurysm / SAH, stented, resp failure  SIGNIFICANT EVENTS / STUDIES:  6/17 ct head - High attenuation material consistent with subarachnoid hemorrhage is noted filling the suprasellar cistern extending inferiorly along the anterior surface of the brainstem and upper cervical spinal cord. Some of the subarachnoid hemorrhage extends anterior to the left temporal lobe and along the undersurface of the left cerebral hemisphere. 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 The largest areas with subarachnoid hemorrhage have not significantly changed. No significant hydrocephalus. 6/17- Resp failure, vent  LINES / TUBES: 6/17 ETT>>> 6/17 a line left rad>>> 6/17 rt fem sheeth>>>  CULTURES:  ANTIBIOTICS:   HISTORY OF PRESENT ILLNESS:  History taken by chart review as patient intubated and no family available.  37 year old female presents with sudden onset of severe headache yesterday evening around 6 PM. Head CT scan which demonstrated evidence of a fairly large amount of blood within her left-sided carotid cistern extending into her sylvian fissure. No evidence of hydrocephalus. No evidence of intraparenchymal hemorrhage.  She has a positive family history of aneurysm with a father died from cerebral aneurysm. Positive for photophobia. She denies any diplopia. She's having no visual disturbances. She denies any numbness paresthesias or weakness. She's had no seizures. To IR, Pipeline stent  placed, some clot noted, required asa, plavix, heparin. transferred to ICu on vent. No active wheezng reported Reports use alb 4 x per year.  PAST MEDICAL HISTORY :  Past Medical History  Diagnosis Date  . Asthma    Past Surgical History  Procedure Laterality Date  . Arthroscopic knee     Prior to Admission medications   Not on File   No Known Allergies  FAMILY HISTORY:  Family History  Problem Relation Age of Onset  . Hypertension Mother   . Hyperlipidemia Father   . Hypertension Other    SOCIAL HISTORY:  reports that she has never smoked. She does not have any smokeless tobacco history on file. She reports that  drinks alcohol. She reports that she does not use illicit drugs.  REVIEW OF SYSTEMS:  Unable to obtain as vent sedated, never intubated asthma  SUBJECTIVE:  sedated  VITAL SIGNS: Temp:  [97.4 F (36.3 C)-99.1 F (37.3 C)] 98.3 F (36.8 C) (06/17 1830) Pulse Rate:  [24-120] 101 (06/17 1845) Resp:  [10-32] 17 (06/17 1845) BP: (115-166)/(65-104) 128/67 mmHg (06/17 1845) SpO2:  [97 %-100 %] 100 % (06/17 1845) Arterial Line BP: (129-155)/(63-82) 132/66 mmHg (06/17 1845) FiO2 (%):  [100 %] 100 % (06/17 1830) Weight:  [83.5 kg (184 lb 1.4 oz)] 83.5 kg (184 lb 1.4 oz) (06/17 0800) HEMODYNAMICS:   VENTILATOR SETTINGS: Vent Mode:  [-] PRVC FiO2 (%):  [100 %] 100 % Set Rate:  [16 bmp] 16 bmp Vt Set:  [440 mL] 440 mL PEEP:  [5 cmH20] 5 cmH20 Plateau Pressure:  [24 cmH20] 24 cmH20 INTAKE / OUTPUT: Intake/Output     06/16 0701 - 06/17 0700 06/17 0701 - 06/18 0700   I.V. (mL/kg)  2000 1835 (22)   IV Piggyback  100   Total Intake(mL/kg) 2000 1935 (23.2)   Urine (mL/kg/hr)  2500 (2.5)   Total Output   2500   Net +2000 -565        Emesis Occurrence  1 x     PHYSICAL EXAMINATION: Gen: sedated on vent HEENT: NCAT, PERRL, proptosis, ETT in place PULM: CTA B, good air entry bilaterally CV: RRR, no mgr AB: BS+, soft, nontender Ext: warm, no edema Neuro:  sedated on vent Derm: no rash or skin breakdown  LABS:  Recent Labs Lab 10/21/12 2147 10/21/12 2353 10/22/12 0255 10/22/12 0805  HGB 13.2  --   --   --   WBC 10.3  --   --   --   PLT 269  --   --   --   NA 134*  --   --   --   K 2.9*  --   --   --   CL 98  --   --   --   CO2 17*  --   --   --   GLUCOSE 177*  --   --   --   BUN 10  --   --   --   CREATININE 0.72  --   --   --   CALCIUM 10.1  --   --   --   AST 22  --   --   --   ALT 27  --   --   --   ALKPHOS 85  --   --   --   BILITOT 0.2*  --   --   --   PROT 8.3  --   --   --   ALBUMIN 4.2  --   --   --   APTT  --   --   --  24  INR  --   --   --  1.00  LATICACIDVEN  --  4.45* 3.96*  --    No results found for this basename: GLUCAP,  in the last 168 hours  CXR: pending  ASSESSMENT / PLAN:  PULMONARY A: Post procedure respiratory failure H/o controlled Asthma, not in exacerbation P:   TVol 8 cc/kg ABG in 100% then rapid reduction O2 needs to goal 40% peep 5 May need slight increase rate as sedation deep STAT pcxr assess ett, aspiration Add albuteral q6h  CARDIOVASCULAR A: SAH P:  MAP goals per IR / NS post stent, in next 48 hrs, HHH, allow MAP to rise, pending stent status Heparin required per IR, see heme Tele nimodipine, for 21 days With concern risk elevated icp , would favor dc NTG when able Ecg, at risk high catacholamines surge / ischemia  RENAL A:  Lactic acidosis, hypokalemia, hyponatremia P:   Repeat lactic acid now, unclear lactic etiology Saline, chem now assess phos, mag No free water, no 1/2 NS  GASTROINTESTINAL A:  High risk stress ulcer P:   ppi NPO LFT reviewed  HEMATOLOGIC A:  DVt prevention, SAH P:  scd Cbc in am   INFECTIOUS A: Unclear lactic acidosis P:   BC pcxr for asp Low threshold unasyn UA  ENDOCRINE A:  High risk hyperglcyemia P:   Add ssi  NEUROLOGIC A:  SAH, s/o Pipeline stent with thrombus, severe vent dyschrony P:   Asa, plavix, hep per  IR Off 2, 3 Ba now scd High risk propofol syndrome at current 75 mics, need to reduce  propofol, add fent drip for now May need int versed  TODAY'S SUMMARY: Vent, add sedation fent to propofol, pcxr, abg, likely weaning in am, repeat lactic acid  I have personally obtained a history, examined the patient, evaluated laboratory and imaging results, formulated the assessment and plan and placed orders. CRITICAL CARE: The patient is critically ill with multiple organ systems failure and requires high complexity decision making for assessment and support, frequent evaluation and titration of therapies, application of advanced monitoring technologies and extensive interpretation of multiple databases. Critical Care Time devoted to patient care services described in this note is 40 minutes.   Fonnie Jarvis Pulmonary and Critical Care Medicine [facility] Pager: [phone]  10/22/2012, 6:57 PM

## 2012-10-22 NOTE — ED Notes (Signed)
CT scan staff notified of pt's order for CT Head Without Contrast; pt was made aware of plan: to have CT Scan first before transport to Floor Unit.

## 2012-10-22 NOTE — Procedures (Signed)
S/P 4 vessel cerebral arteriogram . RT CFA approach. Findings . 1.2.5 mm x 2.5 mm RT ICA post medial wall Aneurysm with prox vessel dysplasia associated with 1.3 aneurysm.

## 2012-10-22 NOTE — Progress Notes (Signed)
Pt transported to IR again with 3100 RN, radiology RN and CRNA.

## 2012-10-22 NOTE — H&P (Signed)
History and Physical  EARLEEN AOUN BMW:[id number] DOB: 03/27/1976 DOA: 10/21/2012  Referring physician: Dr. Preston Fleeting PCP: REDMON,NOELLE, PA-C   Chief Complaint: headache  HPI: Patient is a 37 y.o. female presenting with no past medical history comes with headaches. Onset at about 7:30 PM as sharp pain in the back of her neck which radiated up through her head. Pain does wax and wane. It is severe and she rates it at 9/10. Nothing makes it better nothing makes it worse. She denies photophobia or phonophobia. She denies blurry or double vision. She has noted that she's feeling lightheaded and mouth is dry. She denies weakness or numbness or tingling. She's never had a headache like this before. She has not had anything to try and treat it. Om initial lab test in ER, patient was found to be hypokalemic with high anion gap metabolic acidosis and it was decided to admit the patient for observation.   15 point review of system is negative except what is noted above in HPI.    Past Medical History  Diagnosis Date  . Asthma     Past Surgical History  Procedure Laterality Date  . Arthroscopic knee      Social History:  reports that she has never smoked. She does not have any smokeless tobacco history on file. She reports that  drinks alcohol. She reports that she does not use illicit drugs.  No Known Allergies  Family History  Problem Relation Age of Onset  . Hypertension Mother   . Hyperlipidemia Father   . Hypertension Other      Prior to Admission medications   Not on File   Physical Exam: Filed Vitals:   10/21/12 2109 10/22/12 0303  BP: 151/103 146/96  Pulse: 92 83  Temp: 97.4 F (36.3 C) 98.7 F (37.1 C)  TempSrc: Oral Oral  Resp: 28 18  SpO2: 100% 98%  Vital signs reviewed Head is normocephalic and atraumatic. PERRLA, EOMI. Oropharynx is clear. Fundi show no hemorrhage, exudate, or papilledema.  Neck is moderately tender diffusely in the posterior aspect with moderate  to severe bilateral paracervical spasm. In spite of this, it is she's able to put the neck through full range of motion.  Back is nontender and there is no CVA tenderness.  Lungs are clear without rales, wheezes, or rhonchi. Chest is nontender.  Heart has regular rate and rhythm without murmur.  Abdomen is soft, flat, nontender without masses or hepatosplenomegaly and peristalsis is normoactive.  Extremities have no cyanosis or edema, full range of motion is present.  Skin is warm and dry without rash.  Neurologic: Mental status is normal, cranial nerves are intact, there are no motor or sensory deficits.    Wt Readings from Last 3 Encounters:  No data found for Wt    Labs on Admission:  Basic Metabolic Panel:  Recent Labs Lab 10/21/12 2147  NA 134*  K 2.9*  CL 98  CO2 17*  GLUCOSE 177*  BUN 10  CREATININE 0.72  CALCIUM 10.1    Liver Function Tests:  Recent Labs Lab 10/21/12 2147  AST 22  ALT 27  ALKPHOS 85  BILITOT 0.2*  PROT 8.3  ALBUMIN 4.2   CBC:  Recent Labs Lab 10/21/12 2147  WBC 10.3  NEUTROABS 4.6  HGB 13.2  HCT 39.2  MCV 86.7  PLT 269    CBG: No results found for this basename: GLUCAP,  in the last 168 hours   Radiological Exams on Admission: No results  found.   Principal Problem:   Increased anion gap metabolic acidosis Active Problems:   Asthma   New onset of headaches   Assessment/Plan Most likely cause at this time seems to be secondary to Respiratory alkalosis, including salicylate intoxication. Rare cause may include a hemorrhagic stroke vs subarachnoid hemorrhage especially patient describing headache as worst headache of life.  -CT head -pain control with IV morphine -Avoid blood thinners -IVF at 125cc hr -repeat lactic acid in AM.  Replete potassium  Likely discharge tomorrow.   Code Status: Full  Family Communication: None Disposition Plan/Anticipated LOS: 1-2 days  Time spent: 55 minutes  Lars Mage,  MD  [facility] Team 5  If 7PM-7AM, please contact night-coverage at [email or web address], password [facility] 10/22/2012, 3:36 AM

## 2012-10-22 NOTE — Anesthesia Preprocedure Evaluation (Signed)
Anesthesia Evaluation  Patient identified by MRN, date of birth, ID band Patient awake    Reviewed: Allergy & Precautions, H&P , NPO status , Patient's Chart, lab work & pertinent test results  Airway Mallampati: I TM Distance: >3 FB Neck ROM: full    Dental   Pulmonary asthma ,          Cardiovascular hypertension, Rhythm:regular Rate:Normal     Neuro/Psych  Headaches,    GI/Hepatic   Endo/Other    Renal/GU      Musculoskeletal   Abdominal   Peds  Hematology   Anesthesia Other Findings Cerebral aneurysms  Reproductive/Obstetrics                           Anesthesia Physical Anesthesia Plan  ASA: III  Anesthesia Plan: General   Post-op Pain Management:    Induction: Intravenous  Airway Management Planned: Oral ETT  Additional Equipment: Arterial line  Intra-op Plan:   Post-operative Plan: Possible Post-op intubation/ventilation  Informed Consent: I have reviewed the patients History and Physical, chart, labs and discussed the procedure including the risks, benefits and alternatives for the proposed anesthesia with the patient or authorized representative who has indicated his/her understanding and acceptance.     Plan Discussed with: CRNA, Anesthesiologist and Surgeon  Anesthesia Plan Comments:         Anesthesia Quick Evaluation

## 2012-10-22 NOTE — Progress Notes (Signed)
Patient update:  I received a call from Dr. Preston Fleeting at about 5:15AM who told me that patient's CT scan of the head is positive for subarachnoid hemorrhage. At this time I called neurology and talked to Dr. Amada Jupiter suggested neurosurgical evaluation. Neurosurgery was called at this time and I talked to Dr. Jordan Likes, who suggested to transfer the patient to 3100 with neurosurgery as primary. Patient was transferred to neurosurgery ICU at [facility] at this time.   I informed the patient and her significant other about the diagnosis and updated them about the plan. Patient revealed that her father died secondary to subarachnoid hemorrhage from cerebral aneurysm.  Lars Mage MD Faculty-Internal Medicine [facility] 10/22/2012 5:45 AM

## 2012-10-22 NOTE — ED Notes (Signed)
Angiogram complete.  Dr Corliss Skains reviewing images. Pt tolerated procedure well.  Aware of events.

## 2012-10-22 NOTE — ED Notes (Addendum)
O2 on 2 l/[place]  Sat 100 prior to o2

## 2012-10-22 NOTE — ED Notes (Signed)
Pt states nausea has improved and headache has slightly improved; pt resp even and unlabored and pt no longer hyperventilating; pt resting in wheelchair in conference room with husband at sides; awaiting room assignment

## 2012-10-22 NOTE — Anesthesia Preprocedure Evaluation (Addendum)
Anesthesia Evaluation  Patient identified by MRN, date of birth, ID band Patient awake    Reviewed: Allergy & Precautions, H&P , NPO status , Patient's Chart, lab work & pertinent test results  Airway Mallampati: I  Neck ROM: limited    Dental  (+) Teeth Intact and Dental Advidsory Given   Pulmonary asthma ,          Cardiovascular Rhythm:regular Rate:Normal     Neuro/Psych  Headaches,    GI/Hepatic   Endo/Other  Morbid obesity  Renal/GU      Musculoskeletal   Abdominal   Peds  Hematology   Anesthesia Other Findings   Reproductive/Obstetrics                         Anesthesia Physical Anesthesia Plan  ASA: III  Anesthesia Plan: General ETT   Post-op Pain Management:    Induction:   Airway Management Planned:   Additional Equipment:   Intra-op Plan:   Post-operative Plan:   Informed Consent: I have reviewed the patients History and Physical, chart, labs and discussed the procedure including the risks, benefits and alternatives for the proposed anesthesia with the patient or authorized representative who has indicated his/her understanding and acceptance.   Dental Advisory Given  Plan Discussed with: Anesthesiologist  Anesthesia Plan Comments:         Anesthesia Quick Evaluation

## 2012-10-22 NOTE — ED Notes (Signed)
Dr Danielle Dess and Dr Corliss Skains explaining options to pt.

## 2012-10-22 NOTE — H&P (Signed)
Anita Rhodes is an 37 y.o. female.   Chief Complaint: Headache  HPI: 38 year old female presents with sudden onset of severe headache yesterday evening around 6 PM. Headache did not abate despite Tylenol and rest. She presented to the [facility] emergency room for further evaluation. Subsequent workup included a head CT scan which demonstrated evidence of a fairly large amount of blood within her left-sided carotid cistern extending into her sylvian fissure. No evidence of hydrocephalus. No evidence of intraparenchymal hemorrhage. Patient has no prior history of headache similar to this. She has a positive family history of aneurysm with a father died from cerebral aneurysm.  Currently she complains of moderate headache. She has some photophobia. She denies any diplopia. She's having no visual disturbances. She denies any numbness paresthesias or weakness. She's had no seizures. She's remained hemodynamically stable.  Past Medical History  Diagnosis Date  . Asthma     Past Surgical History  Procedure Laterality Date  . Arthroscopic knee      Family History  Problem Relation Age of Onset  . Hypertension Mother   . Hyperlipidemia Father   . Hypertension Other    Social History:  reports that she has never smoked. She does not have any smokeless tobacco history on file. She reports that  drinks alcohol. She reports that she does not use illicit drugs.  Allergies: No Known Allergies  No prescriptions prior to admission    Results for orders placed during the hospital encounter of 10/21/12 (from the past 48 hour(s))  CBC WITH DIFFERENTIAL     Status: Abnormal   Collection Time    10/21/12  9:47 PM      Result Value Range   WBC 10.3  4.0 - 10.5 K/uL   RBC 4.52  3.87 - 5.11 MIL/uL   Hemoglobin 13.2  12.0 - 15.0 g/dL   HCT 45.4  09.8 - 11.9 %   MCV 86.7  78.0 - 100.0 fL   MCH 29.2  26.0 - 34.0 pg   MCHC 33.7  30.0 - 36.0 g/dL   RDW 14.7  82.9 - 56.2 %   Platelets 269  150 -  400 K/uL   Neutrophils Relative % 45  43 - 77 %   Neutro Abs 4.6  1.7 - 7.7 K/uL   Lymphocytes Relative 46  12 - 46 %   Lymphs Abs 4.7 (*) 0.7 - 4.0 K/uL   Monocytes Relative 8  3 - 12 %   Monocytes Absolute 0.8  0.1 - 1.0 K/uL   Eosinophils Relative 2  0 - 5 %   Eosinophils Absolute 0.2  0.0 - 0.7 K/uL   Basophils Relative 0  0 - 1 %   Basophils Absolute 0.0  0.0 - 0.1 K/uL  COMPREHENSIVE METABOLIC PANEL     Status: Abnormal   Collection Time    10/21/12  9:47 PM      Result Value Range   Sodium 134 (*) 135 - 145 mEq/L   Potassium 2.9 (*) 3.5 - 5.1 mEq/L   Chloride 98  96 - 112 mEq/L   CO2 17 (*) 19 - 32 mEq/L   Glucose, Bld 177 (*) 70 - 99 mg/dL   BUN 10  6 - 23 mg/dL   Creatinine, Ser 1.30  0.50 - 1.10 mg/dL   Calcium 86.5  8.4 - 78.4 mg/dL   Total Protein 8.3  6.0 - 8.3 g/dL   Albumin 4.2  3.5 - 5.2 g/dL   AST 22  0 - 37 U/L   ALT 27  0 - 35 U/L   Alkaline Phosphatase 85  39 - 117 U/L   Total Bilirubin 0.2 (*) 0.3 - 1.2 mg/dL   GFR calc non Af Amer >90  >90 mL/min   GFR calc Af Amer >90  >90 mL/min   Comment:            The eGFR has been calculated     using the CKD EPI equation.     This calculation has not been     validated in all clinical     situations.     eGFR's persistently     <90 mL/min signify     possible Chronic Kidney Disease.  ACETAMINOPHEN LEVEL     Status: None   Collection Time    10/21/12 11:30 PM      Result Value Range   Acetaminophen (Tylenol), Serum <15.0  10 - 30 ug/mL   Comment:            THERAPEUTIC CONCENTRATIONS VARY     SIGNIFICANTLY. A RANGE OF 10-30     ug/mL MAY BE AN EFFECTIVE     CONCENTRATION FOR MANY PATIENTS.     HOWEVER, SOME ARE BEST TREATED     AT CONCENTRATIONS OUTSIDE THIS     RANGE.     ACETAMINOPHEN CONCENTRATIONS     >150 ug/mL AT 4 HOURS AFTER     INGESTION AND >50 ug/mL AT 12     HOURS AFTER INGESTION ARE     OFTEN ASSOCIATED WITH TOXIC     REACTIONS.  SALICYLATE LEVEL     Status: Abnormal   Collection  Time    10/21/12 11:30 PM      Result Value Range   Salicylate Lvl 2.3 (*) 2.8 - 20.0 mg/dL  CG4 I-STAT (LACTIC ACID)     Status: Abnormal   Collection Time    10/21/12 11:53 PM      Result Value Range   Lactic Acid, Venous 4.45 (*) 0.5 - 2.2 mmol/L  URINALYSIS, ROUTINE W REFLEX MICROSCOPIC     Status: Abnormal   Collection Time    10/22/12  2:35 AM      Result Value Range   Color, Urine YELLOW  YELLOW   APPearance HAZY (*) CLEAR   Specific Gravity, Urine 1.011  1.005 - 1.030   pH 7.5  5.0 - 8.0   Glucose, UA NEGATIVE  NEGATIVE mg/dL   Hgb urine dipstick LARGE (*) NEGATIVE   Bilirubin Urine NEGATIVE  NEGATIVE   Ketones, ur NEGATIVE  NEGATIVE mg/dL   Protein, ur NEGATIVE  NEGATIVE mg/dL   Urobilinogen, UA 0.2  0.0 - 1.0 mg/dL   Nitrite NEGATIVE  NEGATIVE   Leukocytes, UA NEGATIVE  NEGATIVE  URINE MICROSCOPIC-ADD ON     Status: None   Collection Time    10/22/12  2:35 AM      Result Value Range   Squamous Epithelial / LPF RARE  RARE   WBC, UA 0-2  <3 WBC/hpf   RBC / HPF TOO NUMEROUS TO COUNT  <3 RBC/hpf   Bacteria, UA RARE  RARE   Urine-Other TRICHOMONAS PRESENT    POCT PREGNANCY, URINE     Status: None   Collection Time    10/22/12  2:42 AM      Result Value Range   Preg Test, Ur NEGATIVE  NEGATIVE   Comment:  THE SENSITIVITY OF THIS     METHODOLOGY IS >24 mIU/mL  CG4 I-STAT (LACTIC ACID)     Status: Abnormal   Collection Time    10/22/12  2:55 AM      Result Value Range   Lactic Acid, Venous 3.96 (*) 0.5 - 2.2 mmol/L   Ct Head Wo Contrast  10/22/2012   *RADIOLOGY REPORT*  Clinical Data: Headache and lightheadedness.  CT HEAD WITHOUT CONTRAST  Technique:  Contiguous axial images were obtained from the base of the skull through the vertex without contrast.  Comparison: Head CT 01/21/2010.  Findings: High attenuation material consistent with subarachnoid hemorrhage is noted filling the suprasellar cistern extending inferiorly along the anterior surface of the  brainstem and upper cervical spinal cord.  Some of the subarachnoid hemorrhage extends anterior to the left temporal lobe and along the undersurface of the left cerebral hemisphere.  No evidence of hydrocephalus at this time.  No acute intraparenchymal hemorrhage identified within the brain.  No definite signs of acute/subacute cerebral ischemia.  No significant mass effect.  IMPRESSION: 1.  Large volume of subarachnoid hemorrhage, as above.  Further evaluation with MRA or CTA is suggested to evaluate for potential aneurysmal source.  Critical Value/emergent results were called by telephone at the time of interpretation on 10/22/2012 at 05:08 a.m. to Dr. Preston Fleeting, who verbally acknowledged these results.   Original Report Authenticated By: Trudie Reed, M.D.    Pertinent items are noted in HPI.  Blood pressure 153/94, pulse 86, temperature 98.3 F (36.8 C), temperature source Oral, resp. rate 18, last menstrual period 10/20/2012, SpO2 100.00%.  Patient is awake and aware. She is oriented and appropriate. Her speech is fluent. Judgment and insight appear intact. Cranial nerve function finds her visual acuity and visual fields be normal bilaterally. Extraocular movements were full. Pupils are 3 mm equal round reactive to light briskly bilaterally. Facial movement and facial sensation normal bilaterally. Hearing intact bilaterally. Temperatures the midline. Palate elevates midline. Shoulder shrug equally. Examination of her extremities finds her motor and sensory function in a normal bilaterally. There is no evidence of pronator drift. Cerebellar function is normal.Examination her head ears eyes nose and throat demonstrate no evidence of trauma. neck is a recently supple. There is no evidence of bony mallet. Carotid pulses are normal bilaterally. Airways midline. Chest is cord auscultation. Heart regular in rhythm. Abdomen benign. Extremities free from injury deformity.  Assessment/Plan Grade 1 subarachnoid  hemorrhage worrisome for aneurysmal rupture. Plan ICU admission and observation. Arteriography this morning.  Jerrelle Michelsen A 10/22/2012, 7:30 AM

## 2012-10-22 NOTE — ED Notes (Signed)
[facility] here to transfer pt to [facility]. 

## 2012-10-22 NOTE — Procedures (Signed)
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 3500 units if IV heparin and 9.6 mg of IA INtegrilin

## 2012-10-22 NOTE — Preoperative (Signed)
Beta Blockers   Reason not to administer Beta Blockers:Not Applicable 

## 2012-10-22 NOTE — ED Notes (Signed)
MD Preston Fleeting was made aware of the pts critical lactic acid result of 3.96 @ 0308

## 2012-10-22 NOTE — ED Notes (Signed)
Attempting to void on bedpan

## 2012-10-22 NOTE — H&P (Signed)
Anita Rhodes is an 37 y.o. female.   Chief Complaint: Pt experienced "normal headache" yesterday With sudden onset severe neck pain after work -"like someone had hit me in back of neck" Pain continued- so badly it brought her to tears. To [facility] ER- CT shows SAH Scheduled now for cerebral arteriogram with possible aneurysm embolization if necessary. HPI: asthma; pt father passed with cerebral aneurysm 5 yrs ago  Past Medical History  Diagnosis Date  . Asthma     Past Surgical History  Procedure Laterality Date  . Arthroscopic knee      Family History  Problem Relation Age of Onset  . Hypertension Mother   . Hyperlipidemia Father   . Hypertension Other    Social History:  reports that she has never smoked. She does not have any smokeless tobacco history on file. She reports that  drinks alcohol. She reports that she does not use illicit drugs.  Allergies: No Known Allergies  No prescriptions prior to admission    Results for orders placed during the hospital encounter of 10/21/12 (from the past 48 hour(s))  CBC WITH DIFFERENTIAL     Status: Abnormal   Collection Time    10/21/12  9:47 PM      Result Value Range   WBC 10.3  4.0 - 10.5 K/uL   RBC 4.52  3.87 - 5.11 MIL/uL   Hemoglobin 13.2  12.0 - 15.0 g/dL   HCT 16.1  09.6 - 04.5 %   MCV 86.7  78.0 - 100.0 fL   MCH 29.2  26.0 - 34.0 pg   MCHC 33.7  30.0 - 36.0 g/dL   RDW 40.9  81.1 - 91.4 %   Platelets 269  150 - 400 K/uL   Neutrophils Relative % 45  43 - 77 %   Neutro Abs 4.6  1.7 - 7.7 K/uL   Lymphocytes Relative 46  12 - 46 %   Lymphs Abs 4.7 (*) 0.7 - 4.0 K/uL   Monocytes Relative 8  3 - 12 %   Monocytes Absolute 0.8  0.1 - 1.0 K/uL   Eosinophils Relative 2  0 - 5 %   Eosinophils Absolute 0.2  0.0 - 0.7 K/uL   Basophils Relative 0  0 - 1 %   Basophils Absolute 0.0  0.0 - 0.1 K/uL  COMPREHENSIVE METABOLIC PANEL     Status: Abnormal   Collection Time    10/21/12  9:47 PM      Result Value Range   Sodium 134  (*) 135 - 145 mEq/L   Potassium 2.9 (*) 3.5 - 5.1 mEq/L   Chloride 98  96 - 112 mEq/L   CO2 17 (*) 19 - 32 mEq/L   Glucose, Bld 177 (*) 70 - 99 mg/dL   BUN 10  6 - 23 mg/dL   Creatinine, Ser 7.82  0.50 - 1.10 mg/dL   Calcium 95.6  8.4 - 21.3 mg/dL   Total Protein 8.3  6.0 - 8.3 g/dL   Albumin 4.2  3.5 - 5.2 g/dL   AST 22  0 - 37 U/L   ALT 27  0 - 35 U/L   Alkaline Phosphatase 85  39 - 117 U/L   Total Bilirubin 0.2 (*) 0.3 - 1.2 mg/dL   GFR calc non Af Amer >90  >90 mL/min   GFR calc Af Amer >90  >90 mL/min   Comment:            The eGFR has been  calculated     using the CKD EPI equation.     This calculation has not been     validated in all clinical     situations.     eGFR's persistently     <90 mL/min signify     possible Chronic Kidney Disease.  ACETAMINOPHEN LEVEL     Status: None   Collection Time    10/21/12 11:30 PM      Result Value Range   Acetaminophen (Tylenol), Serum <15.0  10 - 30 ug/mL   Comment:            THERAPEUTIC CONCENTRATIONS VARY     SIGNIFICANTLY. A RANGE OF 10-30     ug/mL MAY BE AN EFFECTIVE     CONCENTRATION FOR MANY PATIENTS.     HOWEVER, SOME ARE BEST TREATED     AT CONCENTRATIONS OUTSIDE THIS     RANGE.     ACETAMINOPHEN CONCENTRATIONS     >150 ug/mL AT 4 HOURS AFTER     INGESTION AND >50 ug/mL AT 12     HOURS AFTER INGESTION ARE     OFTEN ASSOCIATED WITH TOXIC     REACTIONS.  SALICYLATE LEVEL     Status: Abnormal   Collection Time    10/21/12 11:30 PM      Result Value Range   Salicylate Lvl 2.3 (*) 2.8 - 20.0 mg/dL  CG4 I-STAT (LACTIC ACID)     Status: Abnormal   Collection Time    10/21/12 11:53 PM      Result Value Range   Lactic Acid, Venous 4.45 (*) 0.5 - 2.2 mmol/L  URINALYSIS, ROUTINE W REFLEX MICROSCOPIC     Status: Abnormal   Collection Time    10/22/12  2:35 AM      Result Value Range   Color, Urine YELLOW  YELLOW   APPearance HAZY (*) CLEAR   Specific Gravity, Urine 1.011  1.005 - 1.030   pH 7.5  5.0 - 8.0    Glucose, UA NEGATIVE  NEGATIVE mg/dL   Hgb urine dipstick LARGE (*) NEGATIVE   Bilirubin Urine NEGATIVE  NEGATIVE   Ketones, ur NEGATIVE  NEGATIVE mg/dL   Protein, ur NEGATIVE  NEGATIVE mg/dL   Urobilinogen, UA 0.2  0.0 - 1.0 mg/dL   Nitrite NEGATIVE  NEGATIVE   Leukocytes, UA NEGATIVE  NEGATIVE  URINE MICROSCOPIC-ADD ON     Status: None   Collection Time    10/22/12  2:35 AM      Result Value Range   Squamous Epithelial / LPF RARE  RARE   WBC, UA 0-2  <3 WBC/hpf   RBC / HPF TOO NUMEROUS TO COUNT  <3 RBC/hpf   Bacteria, UA RARE  RARE   Urine-Other TRICHOMONAS PRESENT    POCT PREGNANCY, URINE     Status: None   Collection Time    10/22/12  2:42 AM      Result Value Range   Preg Test, Ur NEGATIVE  NEGATIVE   Comment:            THE SENSITIVITY OF THIS     METHODOLOGY IS >24 mIU/mL  CG4 I-STAT (LACTIC ACID)     Status: Abnormal   Collection Time    10/22/12  2:55 AM      Result Value Range   Lactic Acid, Venous 3.96 (*) 0.5 - 2.2 mmol/L   Ct Head Wo Contrast  10/22/2012   *RADIOLOGY REPORT*  Clinical Data: Headache and lightheadedness.  CT HEAD WITHOUT CONTRAST  Technique:  Contiguous axial images were obtained from the base of the skull through the vertex without contrast.  Comparison: Head CT 01/21/2010.  Findings: High attenuation material consistent with subarachnoid hemorrhage is noted filling the suprasellar cistern extending inferiorly along the anterior surface of the brainstem and upper cervical spinal cord.  Some of the subarachnoid hemorrhage extends anterior to the left temporal lobe and along the undersurface of the left cerebral hemisphere.  No evidence of hydrocephalus at this time.  No acute intraparenchymal hemorrhage identified within the brain.  No definite signs of acute/subacute cerebral ischemia.  No significant mass effect.  IMPRESSION: 1.  Large volume of subarachnoid hemorrhage, as above.  Further evaluation with MRA or CTA is suggested to evaluate for potential  aneurysmal source.  Critical Value/emergent results were called by telephone at the time of interpretation on 10/22/2012 at 05:08 a.m. to Dr. Preston Fleeting, who verbally acknowledged these results.   Original Report Authenticated By: Trudie Reed, M.D.    Review of Systems  Constitutional: Negative for fever.  HENT: Positive for neck pain.   Eyes: Negative for blurred vision.  Respiratory: Negative for shortness of breath.   Cardiovascular: Negative for chest pain.  Gastrointestinal: Positive for nausea. Negative for vomiting and abdominal pain.  Neurological: Positive for weakness and headaches. Negative for dizziness.  Psychiatric/Behavioral: The patient is nervous/anxious.     Blood pressure 151/97, pulse 93, temperature 98.3 F (36.8 C), temperature source Oral, resp. rate 13, last menstrual period 10/20/2012, SpO2 100.00%. Physical Exam  Constitutional: She appears well-developed and well-nourished.  Eyes: EOM are normal.  Neck: Normal range of motion.  Cardiovascular: Normal rate, regular rhythm and normal heart sounds.   No murmur heard. Respiratory: Effort normal and breath sounds normal. She has no wheezes.  GI: Soft. Bowel sounds are normal. There is no tenderness.  Musculoskeletal: Normal range of motion.  Neurological: No cranial nerve deficit.  Skin: Skin is warm and dry.  Psychiatric: She has a normal mood and affect. Her behavior is normal. Judgment and thought content normal.     Assessment/Plan Sudden severe headache last pm CT shows SAH Scheduled now for cerebral arteriogram and poss embolization  Pt aware of procedure benefits and risks and agreeable to proceed Consent signed and in chart  Jimmy Stipes A 10/22/2012, 7:52 AM

## 2012-10-22 NOTE — ED Notes (Signed)
To perform pipeline procedure today.  Will go back to room to prepare pt.  Right leg sheath will stay in place.  Will place foley.  Pt unable to void on bedpan.

## 2012-10-22 NOTE — Transfer of Care (Signed)
Immediate Anesthesia Transfer of Care Note  Patient: Anita Rhodes  Procedure(s) Performed: * No procedures listed *  Patient Location: NICU  Anesthesia Type:General  Level of Consciousness: Patient remains intubated per anesthesia plan  Airway & Oxygen Therapy: Patient remains intubated per anesthesia plan  Post-op Assessment: Post -op Vital signs reviewed and stable  Post vital signs: stable  Complications: No apparent anesthesia complications

## 2012-10-22 NOTE — Progress Notes (Signed)
ANTICOAGULATION CONSULT NOTE - Initial Consult  Pharmacy Consult for Heparin Indication: Dr. Corliss Skains   No Known Allergies  Patient Measurements: Height: 5\' 4"  (162.6 cm) Weight: 184 lb 1.4 oz (83.5 kg) IBW/kg (Calculated) : 54.7  Vital Signs: Temp: 97.9 F (36.6 C) (06/17 1200) Temp src: Oral (06/17 1200) BP: 119/70 mmHg (06/17 1315) Pulse Rate: 93 (06/17 1315)  Labs:  Recent Labs  10/21/12 2147 10/22/12 0805  HGB 13.2  --   HCT 39.2  --   PLT 269  --   APTT  --  24  LABPROT  --  13.1  INR  --  1.00  CREATININE 0.72  --     Estimated Creatinine Clearance: 101.6 ml/min (by C-G formula based on Cr of 0.72).   Medical History: Past Medical History  Diagnosis Date  . Asthma    Assessment: Heparin following post stent placement for aneurysm.  To stop at 7 am  Goal of Therapy:  Heparin level 0.1-0.25 units/ml Monitor platelets by anticoagulation protocol: Yes   Plan:  1) Continue heparin at 500 units / hr 2) Check heparin level at 2 am before discontinuing at 7 am  Thank you. Okey Regal, PharmD  10/22/2012,5:59 PM

## 2012-10-23 ENCOUNTER — Inpatient Hospital Stay (HOSPITAL_COMMUNITY): Payer: 59

## 2012-10-23 DIAGNOSIS — E872 Acidosis, unspecified: Secondary | ICD-10-CM

## 2012-10-23 DIAGNOSIS — R7309 Other abnormal glucose: Secondary | ICD-10-CM

## 2012-10-23 LAB — GLUCOSE, CAPILLARY
Glucose-Capillary: 112 mg/dL — ABNORMAL HIGH (ref 70–99)
Glucose-Capillary: 120 mg/dL — ABNORMAL HIGH (ref 70–99)
Glucose-Capillary: 111 mg/dL — ABNORMAL HIGH (ref 70–99)

## 2012-10-23 LAB — CBC WITH DIFFERENTIAL/PLATELET
Basophils Absolute: 0 10*3/uL (ref 0.0–0.1)
Basophils Relative: 0 % (ref 0–1)
HCT: 35.8 % — ABNORMAL LOW (ref 36.0–46.0)
Hemoglobin: 12 g/dL (ref 12.0–15.0)
Lymphocytes Relative: 35 % (ref 12–46)
MCHC: 33.5 g/dL (ref 30.0–36.0)
Monocytes Absolute: 0.6 10*3/uL (ref 0.1–1.0)
Monocytes Relative: 7 % (ref 3–12)
Neutro Abs: 5.2 10*3/uL (ref 1.7–7.7)
Neutrophils Relative %: 58 % (ref 43–77)
WBC: 9 10*3/uL (ref 4.0–10.5)

## 2012-10-23 LAB — BASIC METABOLIC PANEL
BUN: 3 mg/dL — ABNORMAL LOW (ref 6–23)
CO2: 22 mEq/L (ref 19–32)
Chloride: 106 mEq/L (ref 96–112)
Creatinine, Ser: 0.54 mg/dL (ref 0.50–1.10)
GFR calc Af Amer: >90 mL/min (ref >90)
Potassium: 3.5 mEq/L (ref 3.5–5.1)

## 2012-10-23 LAB — MAGNESIUM: Magnesium: 2 mg/dL (ref 1.5–2.5)

## 2012-10-23 LAB — PHOSPHORUS: Phosphorus: 2.6 mg/dL (ref 2.3–4.6)

## 2012-10-23 LAB — PLATELET INHIBITION P2Y12: Platelet Function  P2Y12: 100 [PRU] — ABNORMAL LOW (ref 194–418)

## 2012-10-23 LAB — HEPARIN LEVEL (UNFRACTIONATED): Heparin Unfractionated: <0.1 IU/mL — ABNORMAL LOW (ref 0.30–0.70)

## 2012-10-23 LAB — POCT ACTIVATED CLOTTING TIME
Activated Clotting Time: 203 seconds
Activated Clotting Time: 219 seconds

## 2012-10-23 MED ORDER — HEPARIN (PORCINE) IN NACL 100-0.45 UNIT/ML-% IJ SOLN
700.0000 [IU]/h | INTRAMUSCULAR | Status: DC
  Administered 2012-10-23: 700 [IU]/h via INTRAVENOUS
  Filled 2012-10-23: qty 250

## 2012-10-23 NOTE — Progress Notes (Signed)
7 Fr RFA sheath removed using Exoseal closure device and manual pressure after the site was prepped and draped.  Digital pressure held for 20 minutes.  Hemostasis obtained at 1220.  Groin reviewed by Leotis Shames RN and Forde Dandy.  No complications during procedure.  Sheath integrity stable and intact upon removal.  Tegaderm, pressure dressing, and sand bag applied.  Distal pulses intact. No hematoma noted.  Pearlean Brownie RT R CV Interventional Radiology

## 2012-10-23 NOTE — Progress Notes (Signed)
Postprocedure day 1. No significant events overnight. Patient extubated this morning. Complains of some right shoulder pain but denies any other problems.  She is afebrile. Her vital signs are stable. Blood pressure is acceptable. CBC and electrolytes normal.  She is awake and aware. She is oriented and appropriate. She complains of some headache and neck stiffness. Visual acuity and visual fields appear intact. Pupils 2 mm and briskly reactive bilaterally. Extraocular movements are full without diplopia. Facial movement normal bilaterally. Temperature is the midline. Motor 5/5 bilaterally although exam limited by right shoulder pain and arterial sheath in her right groin. Chest and abdomen benign.  Status post pipeline reconstruction of intracranial internal carotid artery for treatment of ruptured internal carotid artery aneurysm. Overall doing well. Management anticoagulation per radiology protocol. Mobilize 1 sheath was out. Continue to push IV fluids. Continue nimodipine. Continue ICU observation.

## 2012-10-23 NOTE — Progress Notes (Signed)
ANTICOAGULATION CONSULT NOTE  Pharmacy Consult for Heparin Indication: Dr. Corliss Skains   No Known Allergies  Patient Measurements: Height: 5\' 4"  (162.6 cm) Weight: 184 lb 1.4 oz (83.5 kg) IBW/kg (Calculated) : 54.7  Vital Signs: Temp: 99 F (37.2 C) (06/18 0037) Temp src: Axillary (06/17 2000) BP: 110/63 mmHg (06/18 0100) Pulse Rate: 86 (06/18 0100)  Labs:  Recent Labs  10/21/12 2147 10/22/12 0805 10/22/12 2000 10/23/12 0200  HGB 13.2  --   --   --   HCT 39.2  --   --   --   PLT 269  --   --   --   APTT  --  24  --   --   LABPROT  --  13.1  --   --   INR  --  1.00  --   --   HEPARINUNFRC  --   --   --  <0.10*  CREATININE 0.72  --  0.57  --     Estimated Creatinine Clearance: 101.6 ml/min (by C-G formula based on Cr of 0.57).   Assessment: 37 yo female s/p aneursym stenting for heparin.  Goal of Therapy:  Heparin level 0.1-0.25 units/ml Monitor platelets by anticoagulation protocol: Yes   Plan:  Increase Heparin 700 units/hr  Anita Rhodes, PharmD, BCPS  10/23/2012,3:15 AM

## 2012-10-23 NOTE — Progress Notes (Signed)
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5 [I.V.:3766.5; IV Piggyback:205] Out: 4550 [Urine:4550] Intake/Output this shift:    PE:  99.2; VSS H/H: 12/35.8 (13.2/39) On vent Opens eyes and moves all 4s to command Rt groin no bleeding; no hematoma; soft Rt foot 2+ pulses                                                     Lab Results:   Recent Labs  10/21/12 2147 10/23/12 0445  WBC 10.3 9.0  HGB 13.2 12.0  HCT 39.2 35.8*  PLT 269 246   BMET  Recent Labs  10/22/12 2000 10/23/12 0445  NA 139 136  K 3.4* 3.5  CL 109 106  CO2 21 22  GLUCOSE 123* 121*  BUN 3* 3*  CREATININE 0.57 0.54  CALCIUM 8.8 8.8   PT/INR  Recent Labs  10/22/12 0805  LABPROT 13.1  INR 1.00   ABG No results found for this basename: PHART, PCO2, PO2, HCO3,  in the last 72 hours  Studies/Results: Ct Head Wo Contrast  10/22/2012   *RADIOLOGY REPORT*  Clinical Data: Follow up subarachnoid hemorrhage.  Status post endovascular treatment.  CT HEAD WITHOUT CONTRAST  Technique:  Contiguous axial images were obtained from the base of the skull through the vertex without contrast.  Comparison: 10/22/2012 at 0500 hours  Findings: Endovascular device has been placed in the distal left internal carotid artery.  The extent of the subarachnoid blood has minimally changed.  Again  noted is blood within the pre pontine cistern and extending into the left sylvian fissure.  There is no significant enlargement of the ventricles.  No definite intraventricular hemorrhage.  Again seen is subarachnoid blood near the base of the brainstem.  There may be a small amount of new subarachnoid blood in the right sylvian fissure.  There may be blood near the anterior falx on image 13.   No acute bony abnormality.  IMPRESSION:  There is slightly increased intracranial blood as described.  The largest areas with subarachnoid hemorrhage have not significantly changed.  No significant hydrocephalus.  Endovascular repair as described.   Original Report Authenticated By: Richarda Overlie, M.D.   Ct Head Wo Contrast  10/22/2012   *RADIOLOGY REPORT*  Clinical Data: Headache and lightheadedness.  CT HEAD WITHOUT CONTRAST  Technique:  Contiguous axial images were obtained from the base of the skull through the vertex without contrast.  Comparison: Head CT 01/21/2010.  Findings: High attenuation material consistent with subarachnoid hemorrhage is noted filling the suprasellar cistern extending inferiorly along the anterior surface of the brainstem and upper cervical spinal cord.  Some of the subarachnoid hemorrhage extends anterior to  the left temporal lobe and along the undersurface of the left cerebral hemisphere.  No evidence of hydrocephalus at this time.  No acute intraparenchymal hemorrhage identified within the brain.  No definite signs of acute/subacute cerebral ischemia.  No significant mass effect.  IMPRESSION: 1.  Large volume of subarachnoid hemorrhage, as above.  Further evaluation with MRA or CTA is suggested to evaluate for potential aneurysmal source.  Critical Value/emergent results were called by telephone at the time of interpretation on 10/22/2012 at 05:08 a.m. to Dr. Preston Fleeting, who verbally acknowledged these results.   Original Report Authenticated By: Trudie Reed, M.D.   Portable Chest  Xray  10/22/2012   *RADIOLOGY REPORT*  Clinical Data: Evaluate endotracheal tube.  PORTABLE CHEST - 1 VIEW  Comparison: None.  Findings: Endotracheal tube is 1.9 cm above the carina.  There are low lung volumes with vascular crowding.  Heart size is normal.  No focal chest disease.  No evidence for a pneumothorax.  IMPRESSION: Endotracheal tube as described.  Low lung volumes.   Original Report Authenticated By: Richarda Overlie, M.D.   Dg Abd Portable 1v  10/22/2012   *RADIOLOGY REPORT*  Clinical Data: Orogastric tube placement.  PORTABLE ABDOMEN - 1 VIEW  Comparison: No priors.  Findings: Tip of orogastric tube is in the distal body of the stomach.  A small amount of colonic gas is noted.  No pathologic distension of small bowel.  No gross evidence of pneumoperitoneum on this single supine view.  IMPRESSION: 1.  Tip of orogastric tube is within the distal body of the stomach.   Original Report Authenticated By: Trudie Reed, M.D.    Anti-infectives: Anti-infectives   Start     Dose/Rate Route Frequency Ordered Stop   10/22/12 1445  ceFAZolin (ANCEF) IVPB 2 g/50 mL premix    Comments:  Given in IR   2 g 100 mL/hr over 30 Minutes Intravenous  Once 10/22/12 1438 10/22/12 1438   10/22/12 1438  ceFAZolin (ANCEF) 2-3 GM-% IVPB SOLR    Comments:  LAFFAN, EILEEN: cabinet override      10/22/12 1438 10/23/12 0244      Assessment/Plan: s/p * No surgery found *  R ICA ruptured aneurysm/ SAH Pipeline stent placed in IR with Dr Corliss Skains On vent- plan extubate today- prob Stable Hep off Asa/Plavix given Sheath orders for pull ready   LOS: 2 days    Jafar Poffenberger A 10/23/2012

## 2012-10-23 NOTE — Progress Notes (Signed)
PULMONARY  / CRITICAL CARE MEDICINE  Name: HALAYNA BLANE MRN: [id number] DOB: February 17, 1976    ADMISSION DATE:  10/21/2012 CONSULTATION DATE:  10/22/12  REFERRING MD :  Allie Dimmer / Pool PRIMARY SERVICE: Pool  CHIEF COMPLAINT:  Acute resp failure, SAH  BRIEF PATIENT DESCRIPTION: 37 yr old controlled asthma, ICA aneurysm / SAH, stented, resp failure  SIGNIFICANT EVENTS / STUDIES:  6/17 ct head - High attenuation material consistent with subarachnoid hemorrhage is noted filling the suprasellar cistern extending inferiorly along the anterior surface of the brainstem and upper cervical spinal cord. Some of the subarachnoid hemorrhage extends anterior to the left temporal lobe and along the undersurface of the left cerebral hemisphere. 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 The largest areas with subarachnoid hemorrhage have not significantly changed. No significant hydrocephalus. 6/17- Resp failure, vent  LINES / TUBES: 6/17 ETT>>> 6/17 a line left rad>>>  6/17 rt fem sheeth>>> 6/18  CULTURES: Blood 6/17 >>   ANTIBIOTICS: Cefazolin x 1 6/17  HISTORY OF PRESENT ILLNESS:  History taken by chart review as patient intubated and no family available.  37 year old female presents with sudden onset of severe headache yesterday evening around 6 PM. Head CT scan which demonstrated evidence of a fairly large amount of blood within her left-sided carotid cistern extending into her sylvian fissure. No evidence of hydrocephalus. No evidence of intraparenchymal hemorrhage.  She has a positive family history of aneurysm with a father died from cerebral aneurysm. Positive for photophobia. She denies any diplopia. She's having no visual disturbances. She denies any numbness paresthesias or weakness. She's  had no seizures. To IR, Pipeline stent placed, some clot noted, required asa, plavix, heparin. transferred to ICu on vent. No active wheezng reported Reports use alb 4 x per year.  SUBJECTIVE:  Awake, interacting, tolerating PSV this am  VITAL SIGNS: Temp:  [97.9 F (36.6 C)-99.2 F (37.3 C)] 99 F (37.2 C) (06/18 0800) Pulse Rate:  [24-120] 93 (06/18 0900) Resp:  [14-32] 24 (06/18 0900) BP: (110-152)/(60-98) 118/65 mmHg (06/18 0900) SpO2:  [97 %-100 %] 99 % (06/18 0900) Arterial Line BP: (118-155)/(60-82) 128/64 mmHg (06/18 0900) FiO2 (%):  [40 %-100 %] 40 % (06/18 0815) HEMODYNAMICS:   VENTILATOR SETTINGS: Vent Mode:  [-] PRVC FiO2 (%):  [40 %-100 %] 40 % Set Rate:  [16 bmp] 16 bmp Vt Set:  [440 mL] 440 mL PEEP:  [5 cmH20] 5 cmH20 Plateau Pressure:  [9 cmH20-24 cmH20] 17 cmH20 INTAKE / OUTPUT: Intake/Output     06/17 0701 - 06/18 0700 06/18 0701 - 06/19 0700   I.V. (mL/kg) 3766.5 (45.1) 922.3 (11)   IV Piggyback 205 100   Total Intake(mL/kg) 3971.5 (47.6) 1022.3 (12.2)   Urine (mL/kg/hr) 4550 (2.3)    Total Output 4550     Net -578.5 +1022.3        Emesis Occurrence 1 x      PHYSICAL EXAMINATION: Gen: obese woman, awake off sedation HEENT: NCAT, PERRL, proptosis, ETT in place PULM: CTA B, good air entry bilaterally CV: RRR, no mgr AB: BS+, soft, nontender Ext: warm, no edema Neuro: awake, responds to voice, follows commands, moves all ext Derm: no rash or skin breakdown  LABS:  Recent Labs Lab 10/21/12 2147 10/21/12 2353 10/22/12 0255 10/22/12 0805 10/22/12 2000 10/23/12 0445  HGB  13.2  --   --   --   --  12.0  WBC 10.3  --   --   --   --  9.0  PLT 269  --   --   --   --  246  NA 134*  --   --   --  139 136  K 2.9*  --   --   --  3.4* 3.5  CL 98  --   --   --  109 106  CO2 17*  --   --   --  21 22  GLUCOSE 177*  --   --   --  123* 121*  BUN 10  --   --   --  3* 3*  CREATININE 0.72  --   --   --  0.57 0.54  CALCIUM 10.1  --   --   --  8.8 8.8  MG   --   --   --   --  2.0 2.0  PHOS  --   --   --   --  2.2* 2.6  AST 22  --   --   --   --   --   ALT 27  --   --   --   --   --   ALKPHOS 85  --   --   --   --   --   BILITOT 0.2*  --   --   --   --   --   PROT 8.3  --   --   --   --   --   ALBUMIN 4.2  --   --   --   --   --   APTT  --   --   --  24  --   --   INR  --   --   --  1.00  --   --   LATICACIDVEN  --  4.45* 3.96*  --  1.9  --     Recent Labs Lab 10/22/12 2030 10/23/12 0035 10/23/12 0425  GLUCAP 119* 112* 104*    CXR: pending  ASSESSMENT / PLAN:  PULMONARY A: Post procedure respiratory failure H/o controlled Asthma, not in exacerbation P:   Goal extubation today 6/18 Add albuteral q6h  CARDIOVASCULAR A: SAH P:  MAP goals per IR / NS post stent, in next 48 hrs, HHH, allow MAP to rise, pending stent status Heparin required per IR, see heme Tele nimodipine, for 21 days With concern risk elevated icp , will dc NTG on extubation   RENAL A:  Hypokalemia, hyponatremia, improved P:   Follow BMP Replace lytes prn  GASTROINTESTINAL A:  High risk stress ulcer P:   ppi NPO  HEMATOLOGIC A:  DVt prevention, SAH P:  scd Follow CBC  INFECTIOUS A: no evidence infection at this time P:   BC sent UA  ENDOCRINE A:  High risk hyperglcyemia P:   SSI protocol  NEUROLOGIC A:  SAH, s/o Pipeline stent with thrombus P:   Asa, plavix, hep per IR  TODAY'S SUMMARY: Hx asthma, not currently exacerbated. Vent due to ICA aneurysm, SAH s/p stenting. On heparin + ASA + plavix. Goal extubation 6/18  I have personally obtained a history, examined the patient, evaluated laboratory and imaging results, formulated the assessment and plan and placed orders.  CRITICAL CARE: The patient is critically ill with multiple organ systems failure and requires high complexity decision making for  assessment and support, frequent evaluation and titration of therapies, application of advanced monitoring technologies and extensive  interpretation of multiple databases. Critical Care Time devoted to patient care services described in this note is 35 minutes.    Levy Pupa, MD, PhD 10/23/2012, 10:00 AM [facility] [phone] or if no answer [phone]

## 2012-10-23 NOTE — Progress Notes (Signed)
Patient ID: Anita Rhodes, female   DOB: 24-Oct-1975, 37 y.o.   MRN: [id number] I spoke with patient yesterday immediately after her angiogram while still in the angiogram table. Dr.-were discussed with the findings of small to a half millimeters aneurysm on the backside of the carotid artery with irregularity of the carotid artery evident also is felt that this would not be easily approachable by surgery and the abnormalities in the walls of the carotid with diffuse dilatation of the carotid would not be treatable surgically other than by wrapping in that regard the patient was prepared for stenting using a flow diver. Major risks of the surgery were discussed with the patient. She is aware of the potential for the major complications that exist is also aware of the consequences of subarachnoid hemorrhage with her father having succumbed to this process some years ago. We decided to proceed with endovascular treatment of this process.

## 2012-10-23 NOTE — Procedures (Signed)
Extubation Procedure Note  Patient Details:   Name: SAMATHA ANSPACH DOB: 01-09-1976 MRN: [id number]   Airway Documentation:     Evaluation  O2 sats: stable throughout Complications: No apparent complications Patient did tolerate procedure well. Bilateral Breath Sounds: Clear;Diminished Suctioning: Airway Yes  Mercy Riding, Lundynn Cohoon L 10/23/2012, 10:02 AM

## 2012-10-23 NOTE — Progress Notes (Signed)
Patient ID: [name], female   DOB: August 01, 1975, 37 y.o.   MRN: [id number] Post day 1 endovascular treatment of RT ICA intracranial post wall aneurysm using the pipeline flow diverter device.  Presently extubated . C/O a bifrontal H/A of 4/10/. Nausea+. Vomited x1 .Anita Rhodes Photophobia +. Denies any speech,motor sensory or visual symptoms.. Denies any chest pains ,or SOB.  On IV Cardene. IV heparin stopped. On aspirin 325mg   and plavix  75 mg per day  . On exam. VS BP 135s/70s.  PaO2  > 95 % RA.  HR  80s SR.  Neuro Exam..  Alert, awake oriented to time ,place and space.Anita Rhodes  Speech and comprehension clear.Anita Rhodes  PEARLA..3mm RT = LT   EOMS full.Jill [place].Grosly full to confrontation.  No Facial asymmetry.  Tongue Midline..  Motor..           NO drift of outstretched arms..          Power.5/5 Proximally and distally all four extremities..  Fine motor and coordination to finger to nose equal..  Gait Not tested.  Romberg Not tested.  Heel to toe.Not tested.  Rt groin soft though tender+ . No palpable hematoma. Pulses 2+.  Plan. 1.Cont with neuro obs. 2. HOB 30 degrees at 500pm 3.When up advance liquids as tolerated.. 4. TCDs on Friday./Mon/Wed.. D/W patient and family.

## 2012-10-24 LAB — PHOSPHORUS: Phosphorus: 3.1 mg/dL (ref 2.3–4.6)

## 2012-10-24 LAB — BASIC METABOLIC PANEL
CO2: 18 mEq/L — ABNORMAL LOW (ref 19–32)
Chloride: 102 mEq/L (ref 96–112)
GFR calc non Af Amer: >90 mL/min (ref >90)
Glucose, Bld: 106 mg/dL — ABNORMAL HIGH (ref 70–99)
Potassium: 3.4 mEq/L — ABNORMAL LOW (ref 3.5–5.1)
Sodium: 132 mEq/L — ABNORMAL LOW (ref 135–145)

## 2012-10-24 LAB — GLUCOSE, CAPILLARY
Glucose-Capillary: 108 mg/dL — ABNORMAL HIGH (ref 70–99)
Glucose-Capillary: 98 mg/dL (ref 70–99)
Glucose-Capillary: 99 mg/dL (ref 70–99)

## 2012-10-24 MED ORDER — ALBUTEROL SULFATE HFA 108 (90 BASE) MCG/ACT IN AERS
2.0000 | INHALATION_SPRAY | RESPIRATORY_TRACT | Status: DC | PRN
  Administered 2012-10-26: 2 via RESPIRATORY_TRACT
  Filled 2012-10-24: qty 6.7

## 2012-10-24 NOTE — Progress Notes (Signed)
PULMONARY  / CRITICAL CARE MEDICINE  Name: Anita Rhodes MRN: [id number] DOB: 12/26/1975    ADMISSION DATE:  10/21/2012 CONSULTATION DATE:  10/22/12  REFERRING MD :  Allie Dimmer / Pool PRIMARY SERVICE: Pool  CHIEF COMPLAINT:  Acute resp failure, SAH  BRIEF PATIENT DESCRIPTION: 37 yr old controlled asthma, ICA aneurysm / SAH, stented, resp failure  SIGNIFICANT EVENTS / STUDIES:  6/17 ct head - High attenuation material consistent with subarachnoid hemorrhage is noted filling the suprasellar cistern extending inferiorly along the anterior surface of the brainstem and upper cervical spinal cord. Some of the subarachnoid hemorrhage extends anterior to the left temporal lobe and along the undersurface of the left cerebral hemisphere. 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 The largest areas with subarachnoid hemorrhage have not significantly changed. No significant hydrocephalus. 6/17- Resp failure, vent  LINES / TUBES: 6/17 ETT>>> 6/18 6/17 a line left rad>>>  6/17 rt fem sheeth>>> 6/18  CULTURES: Blood 6/17 >>   ANTIBIOTICS: Cefazolin x 1 6/17  HISTORY OF PRESENT ILLNESS:   37 year old female presents with sudden onset of severe headache yesterday evening around 6 PM. Head CT scan which demonstrated evidence of a fairly large amount of blood within her left-sided carotid cistern extending into her sylvian fissure. No evidence of hydrocephalus. No evidence of intraparenchymal hemorrhage.  She has a positive family history of aneurysm with a father died from cerebral aneurysm. Positive for photophobia. She denies any diplopia. She's having no visual disturbances. She denies any numbness paresthesias or weakness. She's had no seizures. To IR, Pipeline stent placed, some clot noted,  required asa, plavix, heparin. transferred to ICu on vent. No active wheezng reported Reports use alb 4 x per year.  SUBJECTIVE:  Sleepy, c/o some HA  VITAL SIGNS: Temp:  [98.2 F (36.8 C)-98.9 F (37.2 C)] 98.6 F (37 C) (06/18 2356) Pulse Rate:  [79-112] 91 (06/19 0700) Resp:  [15-27] 18 (06/19 0700) BP: (98-145)/(59-122) 124/69 mmHg (06/19 0700) SpO2:  [94 %-100 %] 98 % (06/19 0700) Arterial Line BP: (128-142)/(64-70) 140/65 mmHg (06/18 1100) HEMODYNAMICS:   VENTILATOR SETTINGS:   INTAKE / OUTPUT: Intake/Output     06/18 0701 - 06/19 0700 06/19 0701 - 06/20 0700   I.V. (mL/kg) 4964.6 (59.5) 25 (0.3)   IV Piggyback 205    Total Intake(mL/kg) 5169.6 (61.9) 25 (0.3)   Urine (mL/kg/hr) 5850 (2.9)    Emesis/NG output 350 (0.2)    Total Output 6200     Net -1030.4 +25          PHYSICAL EXAMINATION: Gen: obese woman, sleepy HEENT: NCAT, PERRL, proptosis PULM: CTA B, good air entry bilaterally CV: RRR, no mgr AB: BS+, soft, nontender Ext: warm, no edema Neuro: wakes easily, interacts appropriately, follows commands, moves all ext Derm: no rash or skin breakdown  LABS:  Recent Labs Lab 10/21/12 2147 10/21/12 2353 10/22/12 0255 10/22/12 0805 10/22/12 2000 10/23/12 0445 10/24/12 0540  HGB 13.2  --   --   --   --  12.0  --   WBC 10.3  --   --   --   --  9.0  --   PLT 269  --   --   --   --  246  --   NA 134*  --   --   --  139 136 132*  K 2.9*  --   --   --  3.4* 3.5 3.4*  CL 98  --   --   --  109 106 102  CO2 17*  --   --   --  21 22 18*  GLUCOSE 177*  --   --   --  123* 121* 106*  BUN 10  --   --   --  3* 3* 3*  CREATININE 0.72  --   --   --  0.57 0.54 0.60  CALCIUM 10.1  --   --   --  8.8 8.8 9.0  MG  --   --   --   --  2.0 2.0 2.0  PHOS  --   --   --   --  2.2* 2.6 3.1  AST 22  --   --   --   --   --   --   ALT 27  --   --   --   --   --   --   ALKPHOS 85  --   --   --   --   --   --   BILITOT 0.2*  --   --   --   --   --   --   PROT 8.3  --   --   --    --   --   --   ALBUMIN 4.2  --   --   --   --   --   --   APTT  --   --   --  24  --   --   --   INR  --   --   --  1.00  --   --   --   LATICACIDVEN  --  4.45* 3.96*  --  1.9  --   --     Recent Labs Lab 10/23/12 0425 10/23/12 0818 10/23/12 1544 10/23/12 1935 10/23/12 2355  GLUCAP 104* 120* 120* 111* 98    CXR:   ASSESSMENT / PLAN:  PULMONARY A: Post procedure respiratory failure, extubated 6/18 H/o controlled Asthma, not in exacerbation P:   albuteral q6h  CARDIOVASCULAR A: SAH P:  MAP goals per IR / NS post stent, in next 48 hrs, HHH, allow MAP to rise, pending stent status Heparin required per IR, see heme Tele nimodipine, for 21 days  RENAL A:  Hypokalemia, hyponatremia, improved  Metabolic acidosis, lactic acidosis on presentation etiology unclear P:   Follow BMP Replace lytes prn Recheck lactate 6/19-6/20  GASTROINTESTINAL A:  High risk stress ulcer P:   ppi NPO  HEMATOLOGIC A:  DVt prevention, SAH P:  scd Follow CBC  INFECTIOUS A: no evidence infection at this time, UA w trace LE 6/17 P:   BC sent and pending  ENDOCRINE A:  High risk hyperglycemia P:   SSI protocol  NEUROLOGIC A:  SAH, s/o Pipeline stent with thrombus P:   Asa, plavix, hep per IR  TODAY'S SUMMARY: Hx asthma, not currently exacerbated. Vent due to ICA aneurysm, SAH s/p stenting. On heparin + ASA + plavix. Extubation 6/18. Lactic acidosis unclear etiology  I have personally obtained a history, examined the patient, evaluated laboratory and imaging results, formulated the assessment and plan and placed orders.   Levy Pupa, MD, PhD 10/24/2012, 8:19 AM [facility] [phone] or if no answer [phone]

## 2012-10-24 NOTE — Progress Notes (Signed)
Physical Therapy Evaluation Patient Details Name: Anita Rhodes MRN: [id number] DOB: [birth year] Today's Date: 10/24/2012 Time: 4098-1191 PT Time Calculation (min): 25 min  PT Assessment / Plan / Recommendation Clinical Impression  Pt s/p SAH with stent and carotid a reconstruction.  Will benefit fron PT to address balance, strength and endurance issues.      PT Assessment  Patient needs continued PT services    Follow Up Recommendations  Home health PT;Supervision/Assistance - 24 hour                Equipment Recommendations  Other (comment) (TBA)         Frequency Min 4X/week    Precautions / Restrictions Precautions Precautions: Fall Restrictions Weight Bearing Restrictions: No   Pertinent Vitals/Pain HR from 88-140 bpm, RR 40 during transfer; BP dropped to 102/87 when pt on toilet with pt dizzy. BP returned to 118/64 at end of treatment.  HA 6/10 with meds given by nursing earlier.        Mobility  Bed Mobility Bed Mobility: Rolling Left;Left Sidelying to Sit Rolling Left: 1: +2 Total assist Rolling Left: Patient Percentage: 40% Left Sidelying to Sit: 1: +2 Total assist;HOB elevated Left Sidelying to Sit: Patient Percentage: 50% Details for Bed Mobility Assistance: cues for sequence and technique.  Pt stated it hurt her head and neck to push up with her arm.   Transfers Transfers: Sit to Stand;Stand to Sit;Stand Pivot Transfers Sit to Stand: 1: +2 Total assist;With upper extremity assist;From bed Sit to Stand: Patient Percentage: 60% Stand to Sit: 1: +2 Total assist;With upper extremity assist;With armrests;To chair/3-in-1 Stand to Sit: Patient Percentage: 60% Stand Pivot Transfers: 1: +2 Total assist Stand Pivot Transfers: Patient Percentage: 60% Details for Transfer Assistance: Pt stood with bil HHA and pivoted to 3N1 and had BM.  Then stood for PT to clean pt with total assist with pt needing min assist to steady while being cleaned.  Pivoted to recliner  from the 3N1.   Ambulation/Gait Ambulation/Gait Assistance: Not tested (comment) Stairs: No Wheelchair Mobility Wheelchair Mobility: No         PT Diagnosis: Acute pain;Generalized weakness  PT Problem List: Decreased strength;Decreased balance;Decreased mobility;Decreased knowledge of use of DME;Decreased safety awareness;Decreased knowledge of precautions;Pain PT Treatment Interventions: DME instruction;Gait training;Stair training;Functional mobility training;Therapeutic activities;Therapeutic exercise;Balance training;Patient/family education   PT Goals Acute Rehab PT Goals PT Goal Formulation: With patient Time For Goal Achievement: 11/07/12 Potential to Achieve Goals: Good Pt will go Supine/Side to Sit: Independently PT Goal: Supine/Side to Sit - Progress: Goal set today Pt will go Sit to Stand: Independently PT Goal: Sit to Stand - Progress: Goal set today Pt will Ambulate: >150 feet;with modified independence;with least restrictive assistive device PT Goal: Ambulate - Progress: Goal set today Pt will Go Up / Down Stairs: Flight;with modified independence;with least restrictive assistive device PT Goal: Up/Down Stairs - Progress: Goal set today Pt will Perform Home Exercise Program: Independently PT Goal: Perform Home Exercise Program - Progress: Goal set today  Visit Information  Last PT Received On: 10/24/12 Assistance Needed: +2 (secondary to decr BP on eval)    Subjective Data  Subjective: "I need to use the bathroom." Patient Stated Goal: to go home   Prior Functioning  Home Living Lives With: Significant other;Other (Comment) (fiance) Available Help at Discharge: Family;Available 24 hours/day Type of Home: House Home Access: Level entry Home Layout: Two level;1/2 bath on main level;Bed/bath upstairs Alternate Level Stairs-Number of Steps: flight Alternate Level Stairs-Rails:  Left Bathroom Shower/Tub: Tub/shower unit;Walk-in shower Bathroom Toilet:  Standard Home Adaptive Equipment: None Prior Function Level of Independence: Independent Able to Take Stairs?: Yes Driving: Yes 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 Communication Communication: No difficulties Dominant Hand: Right    Cognition  Cognition Arousal/Alertness: Awake/alert Behavior During Therapy: WFL for tasks assessed/performed Overall Cognitive Status: Within Functional Limits for tasks assessed    Extremity/Trunk Assessment Right Lower Extremity Assessment RLE ROM/Strength/Tone: Deficits RLE ROM/Strength/Tone Deficits: appears grossly 3/5 Left Lower Extremity Assessment LLE ROM/Strength/Tone: Deficits LLE ROM/Strength/Tone Deficits: appears grossly 3/5   Balance Static Standing Balance Static Standing - Balance Support: Bilateral upper extremity supported;During functional activity Static Standing - Level of Assistance: 4: Min assist Static Standing - Comment/# of Minutes: 3 minutes stood to be cleaned  End of Session PT - End of Session Equipment Utilized During Treatment: Gait belt Activity Tolerance: Patient limited by fatigue Patient left: in chair;with call bell/phone within reach;with family/visitor present;Other (comment) (fiance came in when PT finishing up session) Nurse Communication: Mobility status       INGOLD,Sharifah Champine 10/24/2012, 9:21 AM [facility] [phone] (pager)

## 2012-10-24 NOTE — Progress Notes (Signed)
Patient ID: Anita Rhodes, female   DOB: 1975-10-12, 37 y.o.   MRN: [id number] Post procedure day 2.SAhH day2.  C/O photophobia  And HA ,with receding nausea. Eating and tolerating liquids. VS stable . Neuro exam non focal.. Plan.. Continue with close neuro obs . TCDs in AM

## 2012-10-24 NOTE — Progress Notes (Signed)
Subjective: R ICA aneurysm rupture/ SAH Pipeline stent placed 6/17 Pt groggy but stable Headache moderate  Objective: Vital signs in last 24 hours: Temp:  [98.2 F (36.8 C)-99 F (37.2 C)] 98.6 F (37 C) (06/18 2356) Pulse Rate:  [79-112] 91 (06/19 0700) Resp:  [15-27] 18 (06/19 0700) BP: (98-145)/(59-122) 124/69 mmHg (06/19 0700) SpO2:  [94 %-100 %] 98 % (06/19 0700) Arterial Line BP: (128-142)/(64-70) 140/65 mmHg (06/18 1100) FiO2 (%):  [40 %] 40 % (06/18 0815)    Intake/Output from previous day: 06/18 0701 - 06/19 0700 In: 5169.6 [I.V.:4964.6; IV Piggyback:205] Out: 6200 [Urine:5850; Emesis/NG output:350] Intake/Output this shift:    PE:  Afeb; VSS A/O; appropriate Face symm Tongue midline Good strength B Moves all 4s Follows all commands Smile= Rt groin sl tender; no bleeding; no hematoma Rt foot 2+ pulses Good UOP  Lab Results:   Recent Labs  10/21/12 2147 10/23/12 0445  WBC 10.3 9.0  HGB 13.2 12.0  HCT 39.2 35.8*  PLT 269 246   BMET  Recent Labs  10/23/12 0445 10/24/12 0540  NA 136 132*  K 3.5 3.4*  CL 106 102  CO2 22 18*  GLUCOSE 121* 106*  BUN 3* 3*  CREATININE 0.54 0.60  CALCIUM 8.8 9.0   PT/INR  Recent Labs  10/22/12 0805  LABPROT 13.1  INR 1.00   ABG No results found for this basename: PHART, PCO2, PO2, HCO3,  in the last 72 hours  Studies/Results: Ct Head Wo Contrast  10/22/2012   *RADIOLOGY REPORT*  Clinical Data: Follow up subarachnoid hemorrhage.  Status post endovascular treatment.  CT HEAD WITHOUT CONTRAST  Technique:  Contiguous axial images were obtained from the base of the skull through the vertex without contrast.  Comparison: 10/22/2012 at 0500 hours  Findings: Endovascular device has been placed in the distal left internal carotid artery.  The extent of the subarachnoid blood has minimally changed.  Again noted is blood within the pre pontine cistern and extending into the left sylvian fissure.  There is no  significant enlargement of the ventricles.  No definite intraventricular hemorrhage.  Again seen is subarachnoid blood near the base of the brainstem.  There may be a small amount of new subarachnoid blood in the right sylvian fissure.  There may be blood near the anterior falx on image 13.   No acute bony abnormality.  IMPRESSION:  There is slightly increased intracranial blood as described.  The largest areas with subarachnoid hemorrhage have not significantly changed.  No significant hydrocephalus.  Endovascular repair as described.   Original Report Authenticated By: Richarda Overlie, M.D.   Dg Chest Port 1 View  10/23/2012   *RADIOLOGY REPORT*  Clinical Data: Check endotracheal tube position  PORTABLE CHEST - 1 VIEW  Comparison: 10/22/2012  Findings: Endotracheal tube is now just above the level of the carina directed towards right mainstem bronchus and should be withdrawn 1-2 cm.  The lung volumes are within normal limits.  The cardiac shadow is stable.  No focal infiltrate is seen.  IMPRESSION: Endotracheal tube at the level of the carina.  This should be withdrawn.   Original Report Authenticated By: Alcide Clever, M.D.   Portable Chest Xray  10/22/2012   *RADIOLOGY REPORT*  Clinical Data: Evaluate endotracheal tube.  PORTABLE CHEST - 1 VIEW  Comparison: None.  Findings: Endotracheal tube is 1.9 cm above the carina.  There are low lung volumes with vascular crowding.  Heart size is normal.  No focal chest disease.  No evidence for a pneumothorax.  IMPRESSION: Endotracheal tube as described.  Low lung volumes.   Original Report Authenticated By: Richarda Overlie, M.D.   Dg Abd Portable 1v  10/22/2012   *RADIOLOGY REPORT*  Clinical Data: Orogastric tube placement.  PORTABLE ABDOMEN - 1 VIEW  Comparison: No priors.  Findings: Tip of orogastric tube is in the distal body of the stomach.  A small amount of colonic gas is noted.  No pathologic distension of small bowel.  No gross evidence of pneumoperitoneum on this  single supine view.  IMPRESSION: 1.  Tip of orogastric tube is within the distal body of the stomach.   Original Report Authenticated By: Trudie Reed, M.D.    Anti-infectives: Anti-infectives   Start     Dose/Rate Route Frequency Ordered Stop   10/22/12 1445  ceFAZolin (ANCEF) IVPB 2 g/50 mL premix    Comments:  Given in IR   2 g 100 mL/hr over 30 Minutes Intravenous  Once 10/22/12 1438 10/22/12 1438   10/22/12 1438  ceFAZolin (ANCEF) 2-3 GM-% IVPB SOLR    Comments:  LAFFAN, EILEEN: cabinet override      10/22/12 1438 10/23/12 0244      Assessment/Plan: s/p * No surgery found *  R ICA ruptured aneurysm pipeline stent 6/17 Stable Appropriate For TCDs Fr/M/W Will report to Dr Corliss Skains   LOS: 3 days    Moe Graca A 10/24/2012

## 2012-10-24 NOTE — Progress Notes (Signed)
Postprocedure day 2. Patient complains of moderate headache. States she feels tired. No other complaints. No medical events overnight.  She is afebrile. Her vitals are stable. Urine output is high. Electrolytes demonstrate moderate hyponatremia in stable mild hypokalemia. Renal function is good.  She is mildly somnolent but awakens easily and is conversant. She is fluent. She is oriented x4. Cranial nerve function intact bilaterally. Motor 5/5 bilaterally with no pronator drift. Chest and abdomen benign.  Status post internal carotid artery pipeline reconstruction 4 diversion around ICA aneurysm. Overall doing well. Continue ICU observation and IV fluid.

## 2012-10-25 DIAGNOSIS — R51 Headache: Secondary | ICD-10-CM

## 2012-10-25 LAB — COMPREHENSIVE METABOLIC PANEL
ALT: 45 U/L — ABNORMAL HIGH (ref 0–35)
BUN: 5 mg/dL — ABNORMAL LOW (ref 6–23)
Calcium: 9.1 mg/dL (ref 8.4–10.5)
GFR calc Af Amer: >90 mL/min (ref >90)
Glucose, Bld: 110 mg/dL — ABNORMAL HIGH (ref 70–99)
Sodium: 136 mEq/L (ref 135–145)
Total Protein: 7.5 g/dL (ref 6.0–8.3)

## 2012-10-25 LAB — CBC
HCT: 34.8 % — ABNORMAL LOW (ref 36.0–46.0)
Hemoglobin: 11.9 g/dL — ABNORMAL LOW (ref 12.0–15.0)
WBC: 8.9 10*3/uL (ref 4.0–10.5)

## 2012-10-25 LAB — GLUCOSE, CAPILLARY
Glucose-Capillary: 110 mg/dL — ABNORMAL HIGH (ref 70–99)
Glucose-Capillary: 114 mg/dL — ABNORMAL HIGH (ref 70–99)

## 2012-10-25 MED ORDER — SODIUM CHLORIDE 0.9 % IV BOLUS (SEPSIS)
1000.0000 mL | Freq: Once | INTRAVENOUS | Status: AC
  Administered 2012-10-25: 1000 mL via INTRAVENOUS

## 2012-10-25 MED ORDER — KETOROLAC TROMETHAMINE 30 MG/ML IJ SOLN
30.0000 mg | Freq: Four times a day (QID) | INTRAMUSCULAR | Status: AC | PRN
  Administered 2012-10-25 – 2012-10-30 (×12): 30 mg via INTRAVENOUS
  Filled 2012-10-25 (×12): qty 1

## 2012-10-25 NOTE — Progress Notes (Signed)
No new evidence overnight. Patient still notes headache. No other problems.  Afebrile. Heart rate and blood pressure stable. Awake and aware. Pupils 2 mm and briskly reactive bilaterally. Extraocular movements are full. Facial movements symmetric. Motor 5/5 bilaterally no pronator drift.   Status post internal carotid artery pipeline reconstruction for treatment of internal carotid artery aneurysm. Continue hydration and nimodipine. Continue ICU observation.

## 2012-10-25 NOTE — Progress Notes (Signed)
  Subjective: SAH/ R ICA aneurysm rupture Pipeline stent placed 6/17 Pt stable Still little groggy Continues to have left headache- facial mostly at left eye region +N; no vomit Has had bm  Objective: Vital signs in last 24 hours: Temp:  [97.3 F (36.3 C)-99 F (37.2 C)] 98.6 F (37 C) (06/20 0400) Pulse Rate:  [73-127] 73 (06/20 0700) Resp:  [12-31] 17 (06/20 0700) BP: (113-140)/(51-95) 121/73 mmHg (06/20 0700) SpO2:  [97 %-100 %] 98 % (06/20 0700) FiO2 (%):  [21 %] 21 % (06/19 1929) Last BM Date: 10/24/12  Intake/Output from previous day: 06/19 0701 - 06/20 0700 In: 3230 [I.V.:3025; IV Piggyback:205] Out: 3825 [Urine:3825] Intake/Output this shift:    PE:  Afeb; vss EOM; appropriate A/O Follows all commands Tongue midline Puffs cheeks= Smile= Good strength B Left arm pain; no swelling; no sign of injury Changed BP cuff to Rt Art line still intact on left Labs wnl  Lab Results:   Recent Labs  10/23/12 0445 10/25/12 0545  WBC 9.0 8.9  HGB 12.0 11.9*  HCT 35.8* 34.8*  PLT 246 264   BMET  Recent Labs  10/24/12 0540 10/25/12 0545  NA 132* 136  K 3.4* 3.7  CL 102 106  CO2 18* 18*  GLUCOSE 106* 110*  BUN 3* 5*  CREATININE 0.60 0.69  CALCIUM 9.0 9.1   PT/INR  Recent Labs  10/22/12 0805  LABPROT 13.1  INR 1.00   ABG No results found for this basename: PHART, PCO2, PO2, HCO3,  in the last 72 hours  Studies/Results: Dg Chest Port 1 View  10/23/2012   *RADIOLOGY REPORT*  Clinical Data: Check endotracheal tube position  PORTABLE CHEST - 1 VIEW  Comparison: 10/22/2012  Findings: Endotracheal tube is now just above the level of the carina directed towards right mainstem bronchus and should be withdrawn 1-2 cm.  The lung volumes are within normal limits.  The cardiac shadow is stable.  No focal infiltrate is seen.  IMPRESSION: Endotracheal tube at the level of the carina.  This should be withdrawn.   Original Report Authenticated By: Alcide Clever,  M.D.    Anti-infectives: Anti-infectives   Start     Dose/Rate Route Frequency Ordered Stop   10/22/12 1445  ceFAZolin (ANCEF) IVPB 2 g/50 mL premix    Comments:  Given in IR   2 g 100 mL/hr over 30 Minutes Intravenous  Once 10/22/12 1438 10/22/12 1438   10/22/12 1438  ceFAZolin (ANCEF) 2-3 GM-% IVPB SOLR    Comments:  LAFFAN, EILEEN: cabinet override      10/22/12 1438 10/23/12 0244      Assessment/Plan: s/p * No surgery found *  R ICA ruptured aneurysm pipeline stent 6/17 will report to Dr Corliss Skains Plan per Dr Jordan Likes   LOS: 4 days    Anita Rhodes A 10/25/2012

## 2012-10-25 NOTE — Progress Notes (Signed)
Physical Therapy Treatment Patient Details Name: Anita Rhodes MRN: [id number] DOB: May 29, 1975 Today's Date: 10/25/2012 Time: 7829-5621 PT Time Calculation (min): 25 min  PT Assessment / Plan / Recommendation Comments on Treatment Session  Pt s/p SAH with right aneurysm rupture with stent and carotid a reconstruction.  Limited today by new onset of left LE pain.  MD aware.  Continue as patient able.      Follow Up Recommendations  Home health PT;Supervision/Assistance - 24 hour                 Equipment Recommendations  Other (comment) (TBA)        Frequency Min 4X/week   Plan Discharge plan remains appropriate;Frequency remains appropriate    Precautions / Restrictions Precautions Precautions: Fall Restrictions Weight Bearing Restrictions: No   Pertinent Vitals/Pain HR up to 140 bpm with transfer, 10/10 left LE pain and HA     Mobility  Bed Mobility Bed Mobility: Rolling Left;Left Sidelying to Sit;Rolling Right;Right Sidelying to Sit Rolling Right: 3: Mod assist Right Sidelying to Sit: 3: Mod assist Details for Bed Mobility Assistance: cues for sequence and technique.  Pt stated it hurt her head and neck to push up with her arm.   Transfers Transfers: Sit to Stand;Stand to Sit;Stand Pivot Transfers Sit to Stand: 1: +2 Total assist;With upper extremity assist;From bed Sit to Stand: Patient Percentage: 60% Stand to Sit: 1: +2 Total assist;With upper extremity assist;With armrests;To chair/3-in-1 Stand to Sit: Patient Percentage: 60% Stand Pivot Transfers: 1: +2 Total assist Stand Pivot Transfers: Patient Percentage: 60% Details for Transfer Assistance: Pt stood to RW and as soon as she stood she started crying and saying that her left LE hurt really bad.  Sat pt down and pt sensitive to touch thigh and calf - anterior and posterior.  Nursing notified.  Decided that it would be ok to get pt up OOB.  Pt performed stand pivot transfer to chair with 2 person assist with pt  putting very little weight on left LE secondary to pain.  Pt did make it to the chair.  Positioned with assist.  Nursing notified that pt had incr tenderness in calf and called the MD.   Ambulation/Gait Ambulation/Gait Assistance: Not tested (comment) Stairs: No Wheelchair Mobility Wheelchair Mobility: No    PT Goals Acute Rehab PT Goals Pt will go Supine/Side to Sit: Independently PT Goal: Supine/Side to Sit - Progress: Progressing toward goal Pt will go Sit to Stand: Independently PT Goal: Sit to Stand - Progress: Progressing toward goal  Visit Information  Last PT Received On: 10/25/12 Assistance Needed: +2    Subjective Data  Subjective: "I need to try."   Cognition  Cognition Arousal/Alertness: Awake/alert Behavior During Therapy: WFL for tasks assessed/performed Overall Cognitive Status: Within Functional Limits for tasks assessed    Balance  Static Standing Balance Static Standing - Balance Support: Bilateral upper extremity supported;During functional activity Static Standing - Level of Assistance: 3: Mod assist Static Standing - Comment/# of Minutes: 2 min needed > assist secondary to left LE pain  End of Session PT - End of Session Equipment Utilized During Treatment: Gait belt Activity Tolerance: Patient limited by fatigue;Patient limited by pain Patient left: in chair;with call bell/phone within reach;with family/visitor present Nurse Communication: Mobility status        INGOLD,Gearldine Looney 10/25/2012, 4:36 PM [facility] [phone] (pager)

## 2012-10-25 NOTE — Progress Notes (Signed)
UR completed 

## 2012-10-25 NOTE — Progress Notes (Signed)
Nursing 18:00  Dr. Yetta Barre aware of TCD results. Order received for fluid bolus over 2 hours and increase in MIVF to 175cc/hr with repeat TCD ordered for tomorrow AM. Bolus started and MIVF rate changed. Will continue to monitor.   Holly Bodily

## 2012-10-25 NOTE — Progress Notes (Signed)
PULMONARY  / CRITICAL CARE MEDICINE  Name: Anita Rhodes MRN: [id number] DOB: 06/12/1975    ADMISSION DATE:  10/21/2012 CONSULTATION DATE:  10/22/12  REFERRING MD :  Allie Dimmer / Pool PRIMARY SERVICE: Pool  CHIEF COMPLAINT:  Acute resp failure, SAH  BRIEF PATIENT DESCRIPTION: 37 yr old controlled asthma, ICA aneurysm / SAH, stented, resp failure  SIGNIFICANT EVENTS / STUDIES:  6/17 ct head - High attenuation material consistent with subarachnoid hemorrhage is noted filling the suprasellar cistern extending inferiorly along the anterior surface of the brainstem and upper cervical spinal cord. Some of the subarachnoid hemorrhage extends anterior to the left temporal lobe and along the undersurface of the left cerebral hemisphere. 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 The largest areas with subarachnoid hemorrhage have not significantly changed. No significant hydrocephalus. 6/17- Resp failure, vent  LINES / TUBES: 6/17 ETT>>> 6/18 6/17 a line left rad>>>  6/17 rt fem sheeth>>> 6/18  CULTURES: Blood 6/17 >>   ANTIBIOTICS: Cefazolin x 1 6/17  HISTORY OF PRESENT ILLNESS:   37 year old female presents with sudden onset of severe headache yesterday evening around 6 PM. Head CT scan which demonstrated evidence of a fairly large amount of blood within her left-sided carotid cistern extending into her sylvian fissure. No evidence of hydrocephalus. No evidence of intraparenchymal hemorrhage.  She has a positive family history of aneurysm with a father died from cerebral aneurysm. Positive for photophobia. She denies any diplopia. She's having no visual disturbances. She denies any numbness paresthesias or weakness. She's had no seizures. To IR, Pipeline stent placed, some clot noted,  required asa, plavix, heparin. transferred to ICu on vent. No active wheezng reported Reports use alb 4 x per year.  SUBJECTIVE:  Sleepy, c/o some HA  VITAL SIGNS: Temp:  [97.3 F (36.3 C)-99 F (37.2 C)] 98.3 F (36.8 C) (06/20 1139) Pulse Rate:  [67-103] 73 (06/20 1100) Resp:  [12-26] 17 (06/20 1100) BP: (115-140)/(63-95) 127/87 mmHg (06/20 1100) SpO2:  [97 %-100 %] 98 % (06/20 1100) FiO2 (%):  [21 %] 21 % (06/19 1929) HEMODYNAMICS:   VENTILATOR SETTINGS: Vent Mode:  [-]  FiO2 (%):  [21 %] 21 % INTAKE / OUTPUT: Intake/Output     06/19 0701 - 06/20 0700 06/20 0701 - 06/21 0700   I.V. (mL/kg) 3025 (36.2) 500 (6)   IV Piggyback 205 105   Total Intake(mL/kg) 3230 (38.7) 605 (7.2)   Urine (mL/kg/hr) 3825 (1.9) 800 (1.9)   Emesis/NG output     Total Output 3825 800   Net -595 -195        Stool Occurrence 2 x      PHYSICAL EXAMINATION: Gen: obese woman, sleepy HEENT: NCAT, PERRL, proptosis PULM: CTA B, good air entry bilaterally CV: RRR, no mgr AB: BS+, soft, nontender Ext: warm, no edema Neuro: wakes easily, interacts appropriately, follows commands, moves all ext Derm: no rash or skin breakdown  LABS:  Recent Labs Lab 10/21/12 2147  10/22/12 0255 10/22/12 0805 10/22/12 2000 10/23/12 0445 10/24/12 0540 10/24/12 0900 10/25/12 0545  HGB 13.2  --   --   --   --  12.0  --   --  11.9*  WBC 10.3  --   --   --   --  9.0  --   --  8.9  PLT 269  --   --   --   --  246  --   --  264  NA 134*  --   --   --  139 136 132*  --  136  K 2.9*  --   --   --  3.4* 3.5 3.4*  --  3.7  CL 98  --   --   --  109 106 102  --  106  CO2 17*  --   --   --  21 22 18*  --  18*  GLUCOSE 177*  --   --   --  123* 121* 106*  --  110*  BUN 10  --   --   --  3* 3* 3*  --  5*  CREATININE 0.72  --   --   --  0.57 0.54 0.60  --  0.69  CALCIUM 10.1  --   --   --  8.8 8.8 9.0  --  9.1  MG  --   --   --   --  2.0 2.0 2.0  --   --   PHOS  --   --   --   --  2.2* 2.6 3.1  --   --   AST 22  --    --   --   --   --   --   --  45*  ALT 27  --   --   --   --   --   --   --  45*  ALKPHOS 85  --   --   --   --   --   --   --  82  BILITOT 0.2*  --   --   --   --   --   --   --  0.3  PROT 8.3  --   --   --   --   --   --   --  7.5  ALBUMIN 4.2  --   --   --   --   --   --   --  3.5  APTT  --   --   --  24  --   --   --   --   --   INR  --   --   --  1.00  --   --   --   --   --   LATICACIDVEN  --   < > 3.96*  --  1.9  --   --  1.6 1.6  < > = values in this interval not displayed.  Recent Labs Lab 10/24/12 1230 10/24/12 1556 10/24/12 1933 10/25/12 0025 10/25/12 0424  GLUCAP 97 99 118* 110* 114*    CXR:   ASSESSMENT / PLAN:  PULMONARY A: Post procedure respiratory failure, extubated 6/18 H/o controlled Asthma, not in exacerbation P:   albuteral q4h prn  CARDIOVASCULAR A: SAH P:  MAP goals per IR / NS post stent, in next 48 hrs, HHH, allow MAP to rise, pending stent status Heparin required per IR, see heme Tele nimodipine, for 21 days  RENAL A:  Hypokalemia, hyponatremia, improved  Metabolic acidosis, lactic acidosis on presentation etiology unclear, resolved P:   Follow BMP Replace lytes prn  GASTROINTESTINAL A:  High risk stress ulcer P:   ppi NPO  HEMATOLOGIC A:  DVt prevention, SAH P:  scd Follow CBC  INFECTIOUS A: no evidence infection  at this time, UA w trace LE 6/17 P:   BC sent and pending  ENDOCRINE A:  High risk hyperglycemia P:   SSI protocol  NEUROLOGIC A:  SAH, s/o Pipeline stent with thrombus P:   Asa, plavix, hep per IR  TODAY'S SUMMARY: Hx asthma, not currently exacerbated. Vent due to ICA aneurysm, SAH s/p stenting. On heparin + ASA + plavix. Extubation 6/18. Lactic acidosis unclear etiology  I have personally obtained a history, examined the patient, evaluated laboratory and imaging results, formulated the assessment and plan and placed orders.  PCCM will check back on Monday. Please call if we can help through the weekend.    Levy Pupa, MD, PhD 10/25/2012, 12:08 PM [facility] [phone] or if no answer [phone]

## 2012-10-25 NOTE — Progress Notes (Signed)
VASCULAR LAB PRELIMINARY  PRELIMINARY  PRELIMINARY  PRELIMINARY  Transcranial Doppler  Date POD PCO2 HCT BP  MCA ACA PCA OPHT SIPH VERT Basilar  10-25-12 VS     Right  Left   121  174   -109  -45   15  61   16  19   30  31    -70  -81     -63         Right  Left                                            Right  Left                                             Right  Left                                             Right  Left                                            Right  Left                                            Right  Left                                        MCA = Middle Cerebral Artery      OPHT = Opthalmic Artery     BASILAR = Basilar Artery   ACA = Anterior Cerebral Artery     SIPH = Carotid Siphon PCA = Posterior Cerebral Artery   VERT = Verterbral Artery                   Normal MCA = 62+\-12 ACA = 50+\-12 PCA = 42+\-23   Left MCA velocities may be higher. Unable to increase scale      Rishita Petron, RVS 10/25/2012, 5:14 PM

## 2012-10-26 MED ORDER — PANTOPRAZOLE SODIUM 40 MG PO TBEC
40.0000 mg | DELAYED_RELEASE_TABLET | Freq: Every day | ORAL | Status: DC
  Administered 2012-10-27 – 2012-11-01 (×6): 40 mg via ORAL
  Filled 2012-10-26 (×6): qty 1

## 2012-10-26 MED ORDER — PHENAZOPYRIDINE HCL 100 MG PO TABS
100.0000 mg | ORAL_TABLET | Freq: Three times a day (TID) | ORAL | Status: DC
  Administered 2012-10-26 – 2012-10-29 (×8): 100 mg via ORAL
  Administered 2012-10-29: 08:00:00 via ORAL
  Filled 2012-10-26 (×14): qty 1

## 2012-10-26 NOTE — Progress Notes (Signed)
PT Cancellation Note  Patient Details Name: Anita Rhodes MRN: [id number] DOB: 12/06/1975   Cancelled Treatment:    Reason Eval/Treat Not Completed: Pain limiting ability to participate. Pt c/o L eye pain. PT to return as able.   Marcene Brawn 10/26/2012, 3:19 PM

## 2012-10-26 NOTE — Progress Notes (Signed)
Patient ID: Anita Rhodes, female   DOB: [birth year], 37 y.o.   MRN: [id number] Subjective: Patient reports minimal aching headache. No numbness tingling or weakness. No Visual changes or nausea and vomiting. Symptoms are acute but stable.  Objective: Vital signs in last 24 hours: Temp:  [98.3 F (36.8 C)-99.7 F (37.6 C)] 98.6 F (37 C) (06/21 0338) Pulse Rate:  [67-103] 72 (06/21 0600) Resp:  [13-28] 25 (06/21 0600) BP: (111-147)/(67-98) 128/92 mmHg (06/21 0600) SpO2:  [97 %-100 %] 98 % (06/21 0600)  Intake/Output from previous day: 06/20 0701 - 06/21 0700 In: 5248.8 [P.O.:755; I.V.:3283.8; IV Piggyback:1210] Out: 5850 [Urine:5850] Intake/Output this shift:    Neurologic: Grossly normal, awake and alert and conversant without aphasia, gaze conjugate, EOMI, no facial asymmetry, good attention span, no pronator drift, moves all extremities equally  Lab Results: Lab Results  Component Value Date   WBC 8.9 10/25/2012   HGB 11.9* 10/25/2012   HCT 34.8* 10/25/2012   MCV 86.6 10/25/2012   PLT 264 10/25/2012   Lab Results  Component Value Date   INR 1.00 10/22/2012   BMET Lab Results  Component Value Date   NA 136 10/25/2012   K 3.7 10/25/2012   CL 106 10/25/2012   CO2 18* 10/25/2012   GLUCOSE 110* 10/25/2012   BUN 5* 10/25/2012   CREATININE 0.69 10/25/2012   CALCIUM 9.1 10/25/2012    Studies/Results: No results found.  Assessment/Plan: Seems to be doing well. TCD MCA velocities were up yesterday, she was given boluses and her maintenance IV fluid was increased. Blood Pressure remains 120/90. Continue HHH, repeat TCD today. Sodium okay.    LOS: 5 days    Anita Rhodes S 10/26/2012, 7:20 AM

## 2012-10-26 NOTE — Progress Notes (Signed)
  Subjective: R ICA ruptured aneurysm Pipeline stent 6/17 Pt some better Headache still apparent- middle of head to left eye Eating some this am  Objective: Vital signs in last 24 hours: Temp:  [98.1 F (36.7 C)-99.7 F (37.6 C)] 98.1 F (36.7 C) (06/21 0800) Pulse Rate:  [67-96] 84 (06/21 0900) Resp:  [13-28] 26 (06/21 0900) BP: (111-147)/(67-98) 134/93 mmHg (06/21 0900) SpO2:  [97 %-100 %] 100 % (06/21 0900) Last BM Date: 10/26/12  Intake/Output from previous day: 06/20 0701 - 06/21 0700 In: 5598.8 [P.O.:755; I.V.:3633.8; IV Piggyback:1210] Out: 5850 [Urine:5850] Intake/Output this shift: Total I/O In: 105 [IV Piggyback:105] Out: -   PE:  Afeb; vss A/O Groggy Moves all 4s= Smile= Tongue midline Good strength= TCDs elevated yesterday  Lab Results:   Recent Labs  10/25/12 0545  WBC 8.9  HGB 11.9*  HCT 34.8*  PLT 264   BMET  Recent Labs  10/24/12 0540 10/25/12 0545  NA 132* 136  K 3.4* 3.7  CL 102 106  CO2 18* 18*  GLUCOSE 106* 110*  BUN 3* 5*  CREATININE 0.60 0.69  CALCIUM 9.0 9.1   PT/INR No results found for this basename: LABPROT, INR,  in the last 72 hours ABG No results found for this basename: PHART, PCO2, PO2, HCO3,  in the last 72 hours  Studies/Results: No results found.  Anti-infectives: Anti-infectives   Start     Dose/Rate Route Frequency Ordered Stop   10/22/12 1445  ceFAZolin (ANCEF) IVPB 2 g/50 mL premix    Comments:  Given in IR   2 g 100 mL/hr over 30 Minutes Intravenous  Once 10/22/12 1438 10/22/12 1438   10/22/12 1438  ceFAZolin (ANCEF) 2-3 GM-% IVPB SOLR    Comments:  LAFFAN, EILEEN: cabinet override      10/22/12 1438 10/23/12 0244      Assessment/Plan: s/p * No surgery found *  Cerebral aneurysm rupture/SAH Aneurysm pipeline stent placed 6/17 Better daily Still headache Will report to Dr Corliss Skains   LOS: 5 days    Ashely Goosby A 10/26/2012

## 2012-10-26 NOTE — Progress Notes (Addendum)
VASCULAR LAB PRELIMINARY  PRELIMINARY  PRELIMINARY  PRELIMINARY  Transcranial Doppler  Date POD PCO2 HCT BP  MCA ACA PCA OPHT SIPH VERT Basilar  10-25-12 VS     Right  Left   121  174   -109  -45   15  61   16  19   30  31    -70  -81     -63    10-26-12 VS     Right  Left   106  173   -84  -60   91  25   27 29     29 27      -57 -60     -62      6-23- 14 SB     Right  Left   98  152   -86  -62   47  40   27  22   27/72  26   -25  -38     -49    6-25- 14 SB     Right  Left   92  153   -77  -82   38  64   20  14   60  --   -43  -24     -40          Right  Left                                            Right  Left                                            Right  Left                                        MCA = Middle Cerebral Artery      OPHT = Opthalmic Artery     BASILAR = Basilar Artery   ACA = Anterior Cerebral Artery     SIPH = Carotid Siphon PCA = Posterior Cerebral Artery   VERT = Verterbral Artery                   Normal MCA = 62+\-12 ACA = 50+\-12 PCA = 42+\-23   Left MCA velocities may be higher. Unable to increase scale 10/25/2012 Left MCA velocity is correct today velocities decreasing at his time 10/26/2012      Toma Deiters, RVS 10/26/2012, 11:32 AM

## 2012-10-26 NOTE — Progress Notes (Signed)
Pt c/o burning in her perineum area following foley care with cleansing spray.  Area cleaned with cool water.  Con't complaints of burning sensation.  MD notified by pt.  Order rec'd to remove foley and continue strict intake and output with BSC or bedpan.  Izzac Rockett, [facility]

## 2012-10-27 LAB — URINALYSIS, ROUTINE W REFLEX MICROSCOPIC
Bilirubin Urine: NEGATIVE
Specific Gravity, Urine: 1.013 (ref 1.005–1.030)
pH: 6 (ref 5.0–8.0)

## 2012-10-27 LAB — URINE MICROSCOPIC-ADD ON

## 2012-10-27 NOTE — Progress Notes (Signed)
Patient ID: Anita Rhodes, female   DOB: 04-23-1976, 37 y.o.   MRN: [id number] Subjective: Patient reports 5/10 aching headache that is not progressive. No visual changes or numbness tingling or weakness. No nausea and vomiting. Medications do help.  Objective: Vital signs in last 24 hours: Temp:  [98 F (36.7 C)-98.8 F (37.1 C)] 98.8 F (37.1 C) (06/21 2000) Pulse Rate:  [66-101] 70 (06/22 0500) Resp:  [12-27] 14 (06/22 0500) BP: (122-162)/(76-100) 132/79 mmHg (06/22 0500) SpO2:  [97 %-100 %] 98 % (06/22 0500)  Intake/Output from previous day: 06/21 0701 - 06/22 0700 In: 5715 [P.O.:480; I.V.:5025; IV Piggyback:210] Out: 5100 [Urine:5100] Intake/Output this shift: Total I/O In: 4130 [I.V.:4025; IV Piggyback:105] Out: 3350 [Urine:3350]  Neurologic: Grossly normal, awake and alert and conversant without aphasia, gaze conjugate, EOMI, no pronator drift, moves all extremities equally Respirations unlabored Heart regular in rhythm Ext: No obvious deformities  Lab Results: Lab Results  Component Value Date   WBC 8.9 10/25/2012   HGB 11.9* 10/25/2012   HCT 34.8* 10/25/2012   MCV 86.6 10/25/2012   PLT 264 10/25/2012   Lab Results  Component Value Date   INR 1.00 10/22/2012   BMET Lab Results  Component Value Date   NA 136 10/25/2012   K 3.7 10/25/2012   CL 106 10/25/2012   CO2 18* 10/25/2012   GLUCOSE 110* 10/25/2012   BUN 5* 10/25/2012   CREATININE 0.69 10/25/2012   CALCIUM 9.1 10/25/2012    Studies/Results: No results found.  Assessment/Plan: Seems stable. Continue HHH therapy. In greater than out by 800 cc yesterday. TCDs were overall stable, still high in the left MCA, but less in the right MCA. She did have some increased velocity and her PCA from 15-97. TCD again tomorrow.   LOS: 6 days    Cleston Lautner S 10/27/2012, 6:55 AM

## 2012-10-27 NOTE — Progress Notes (Signed)
  Subjective: RICA aneurysm rupture Pipeline stent placed 6/17 Pt some better today Headache over left eye Ambulating well TCDs stable- moderately high  States she feels as if left leg is numb when she tries to ambulate  Objective: Vital signs in last 24 hours: Temp:  [98 F (36.7 C)-98.9 F (37.2 C)] 98.9 F (37.2 C) (06/22 0800) Pulse Rate:  [66-101] 74 (06/22 0800) Resp:  [12-27] 18 (06/22 0800) BP: (128-162)/(76-100) 138/91 mmHg (06/22 0800) SpO2:  [97 %-100 %] 100 % (06/22 0800) Last BM Date: 10/27/12  Intake/Output from previous day: 06/21 0701 - 06/22 0700 In: 5890 [P.O.:480; I.V.:5200; IV Piggyback:210] Out: 5100 [Urine:5100] Intake/Output this shift: Total I/O In: 105 [IV Piggyback:105] Out: -   PE:  Afeb; vss Speech stronger today Moves all 4s neuro intact  Lab Results:   Recent Labs  10/25/12 0545  WBC 8.9  HGB 11.9*  HCT 34.8*  PLT 264   BMET  Recent Labs  10/25/12 0545  NA 136  K 3.7  CL 106  CO2 18*  GLUCOSE 110*  BUN 5*  CREATININE 0.69  CALCIUM 9.1   PT/INR No results found for this basename: LABPROT, INR,  in the last 72 hours ABG No results found for this basename: PHART, PCO2, PO2, HCO3,  in the last 72 hours  Studies/Results: No results found.  Anti-infectives: Anti-infectives   Start     Dose/Rate Route Frequency Ordered Stop   10/22/12 1445  ceFAZolin (ANCEF) IVPB 2 g/50 mL premix    Comments:  Given in IR   2 g 100 mL/hr over 30 Minutes Intravenous  Once 10/22/12 1438 10/22/12 1438   10/22/12 1438  ceFAZolin (ANCEF) 2-3 GM-% IVPB SOLR    Comments:  LAFFAN, EILEEN: cabinet override      10/22/12 1438 10/23/12 0244      Assessment/Plan: s/p * No surgery found *  R ICA aneurysm coiling 6/17  will report to Dr Corliss Skains   LOS: 6 days    Jenner Rosier A 10/27/2012

## 2012-10-28 DIAGNOSIS — R58 Hemorrhage, not elsewhere classified: Secondary | ICD-10-CM

## 2012-10-28 LAB — URINE CULTURE

## 2012-10-28 MED ORDER — METHOCARBAMOL 500 MG PO TABS
500.0000 mg | ORAL_TABLET | Freq: Four times a day (QID) | ORAL | Status: DC | PRN
  Administered 2012-10-28 – 2012-10-31 (×7): 500 mg via ORAL
  Filled 2012-10-28 (×7): qty 1

## 2012-10-28 NOTE — Progress Notes (Signed)
Overall doing well. Headache stable. No new neurologic complaints. Does complain of some back discomfort today. On examination she is awake and alert. She is oriented and appropriate. Cranial nerve function is intact. Motor and sensory function extremities normal.  Overall progressing well. Continue IV fluids and observation. Probable transfer to floor tomorrow.

## 2012-10-28 NOTE — Progress Notes (Signed)
  Subjective: RICA aneurysm rupture Pipeline stent placed 6/17 Still with headache behind left eye, but reports a little better each day. Ambulating well Legs weak, has been out of bed, but not much activity.  Objective: Vital signs in last 24 hours: Temp:  [97.9 F (36.6 C)-99.7 F (37.6 C)] 99.2 F (37.3 C) (06/23 1537) Pulse Rate:  [63-115] 115 (06/23 1500) Resp:  [13-30] 18 (06/23 1500) BP: (100-155)/(62-104) 107/76 mmHg (06/23 1500) SpO2:  [98 %-100 %] 100 % (06/23 1500) Last BM Date: 10/27/12  Intake/Output from previous day: 06/22 0701 - 06/23 0700 In: 4410 [I.V.:4200; IV Piggyback:210] Out: 4151 [Urine:4150; Stool:1] Intake/Output this shift: Total I/O In: 1458.3 [I.V.:1353.3; IV Piggyback:105] Out: 1150 [Urine:1150]  PE:  Afeb; vss Eating well Moves all 4s neuro intact.   Anti-infectives: Anti-infectives   Start     Dose/Rate Route Frequency Ordered Stop   10/22/12 1445  ceFAZolin (ANCEF) IVPB 2 g/50 mL premix    Comments:  Given in IR   2 g 100 mL/hr over 30 Minutes Intravenous  Once 10/22/12 1438 10/22/12 1438   10/22/12 1438  ceFAZolin (ANCEF) 2-3 GM-% IVPB SOLR    Comments:  LAFFAN, EILEEN: cabinet override      10/22/12 1438 10/23/12 0244      Assessment/Plan: R ICA aneurysm coiling 6/17 Progressing well. Probable transfer to floor tomorrow Seen with Dr. Corliss Skains  LOS: 7 days    Brayton El 10/28/2012

## 2012-10-28 NOTE — Progress Notes (Signed)
PT Cancellation Note  Patient Details Name: LAINI URICK MRN: [id number] DOB: 08-16-1975   Cancelled Treatment:    Reason Eval/Treat Not Completed: Fatigue/lethargy limiting ability to participate; just back to bed after up on bedside commode.  States almost fell due to leg weakness and prefers not to get back up right now.  RN aware.  Will check back tomorrow.   WYNN,CYNDI 10/28/2012, 3:29 PM

## 2012-10-28 NOTE — Progress Notes (Signed)
PULMONARY  / CRITICAL CARE MEDICINE  Name: Anita Rhodes MRN: [id number] DOB: 02/02/1976    ADMISSION DATE:  10/21/2012 CONSULTATION DATE:  10/22/12  REFERRING MD :  Allie Dimmer / Pool PRIMARY SERVICE: Pool  CHIEF COMPLAINT:  Acute resp failure, SAH  BRIEF PATIENT DESCRIPTION: 37 yr old controlled asthma, ICA aneurysm / SAH, stented, resp failure  SIGNIFICANT EVENTS / STUDIES:  6/17 ct head - High attenuation material consistent with subarachnoid hemorrhage is noted filling the suprasellar cistern extending inferiorly along the anterior surface of the brainstem and upper cervical spinal cord. Some of the subarachnoid hemorrhage extends anterior to the left temporal lobe and along the undersurface of the left cerebral hemisphere. 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 The largest areas with subarachnoid hemorrhage have not significantly changed. No significant hydrocephalus. 6/17- Resp failure, vent  LINES / TUBES: 6/17 ETT>>> 6/18 6/17 a line left rad>>>  6/17 rt fem sheeth>>> 6/18  CULTURES: Blood 6/17 >>   ANTIBIOTICS: Cefazolin x 1 6/17  HISTORY OF PRESENT ILLNESS:   37 year old female presents with sudden onset of severe headache yesterday evening around 6 PM. Head CT scan which demonstrated evidence of a fairly large amount of blood within her left-sided carotid cistern extending into her sylvian fissure. No evidence of hydrocephalus. No evidence of intraparenchymal hemorrhage.  She has a positive family history of aneurysm with a father died from cerebral aneurysm. Positive for photophobia. She denies any diplopia. She's having no visual disturbances. She denies any numbness paresthesias or weakness. She's had no seizures. To IR, Pipeline stent placed, some clot noted,  required asa, plavix, heparin. transferred to ICu on vent. No active wheezng reported Reports use alb 4 x per year.  SUBJECTIVE:  Sleepy, c/o some HA  VITAL SIGNS: Temp:  [97.9 F (36.6 C)-99.7 F (37.6 C)] 97.9 F (36.6 C) (06/23 0800) Pulse Rate:  [63-106] 74 (06/23 1000) Resp:  [13-30] 16 (06/23 1000) BP: (122-155)/(74-104) 139/85 mmHg (06/23 1000) SpO2:  [98 %-100 %] 100 % (06/23 1000) HEMODYNAMICS:   VENTILATOR SETTINGS:   INTAKE / OUTPUT: Intake/Output     06/22 0701 - 06/23 0700 06/23 0701 - 06/24 0700   P.O.     I.V. (mL/kg) 4200 (50.3) 478.3 (5.7)   IV Piggyback 210 105   Total Intake(mL/kg) 4410 (52.8) 583.3 (7)   Urine (mL/kg/hr) 4150 (2.1) 650 (2.3)   Stool 1 (0)    Total Output 4151 650   Net +259 -66.7         PHYSICAL EXAMINATION: Gen: obese woman, arousable and follows command. HEENT: NCAT, PERRL. PULM: CTA B, good air entry bilaterally. CV: RRR, no mgr. AB: BS+, soft, nontender Ext: warm, no edema Neuro: wakes easily, interacts appropriately, follows commands, moves all ext Derm: no rash or skin breakdown  LABS:  Recent Labs Lab 10/21/12 2147  10/22/12 0255 10/22/12 0805 10/22/12 2000 10/23/12 0445 10/24/12 0540 10/24/12 0900 10/25/12 0545  HGB 13.2  --   --   --   --  12.0  --   --  11.9*  WBC 10.3  --   --   --   --  9.0  --   --  8.9  PLT 269  --   --   --   --  246  --   --  264  NA 134*  --   --   --  139 136 132*  --  136  K 2.9*  --   --   --  3.4* 3.5 3.4*  --  3.7  CL 98  --   --   --  109 106 102  --  106  CO2 17*  --   --   --  21 22 18*  --  18*  GLUCOSE 177*  --   --   --  123* 121* 106*  --  110*  BUN 10  --   --   --  3* 3* 3*  --  5*  CREATININE 0.72  --   --   --  0.57 0.54 0.60  --  0.69  CALCIUM 10.1  --   --   --  8.8 8.8 9.0  --  9.1  MG  --   --   --   --  2.0 2.0 2.0  --   --   PHOS  --   --   --   --  2.2* 2.6 3.1  --   --   AST 22  --   --   --   --   --   --   --  45*  ALT 27  --   --   --   --   --   --    --  45*  ALKPHOS 85  --   --   --   --   --   --   --  82  BILITOT 0.2*  --   --   --   --   --   --   --  0.3  PROT 8.3  --   --   --   --   --   --   --  7.5  ALBUMIN 4.2  --   --   --   --   --   --   --  3.5  APTT  --   --   --  24  --   --   --   --   --   INR  --   --   --  1.00  --   --   --   --   --   LATICACIDVEN  --   < > 3.96*  --  1.9  --   --  1.6 1.6  < > = values in this interval not displayed.  Recent Labs Lab 10/24/12 1230 10/24/12 1556 10/24/12 1933 10/25/12 0025 10/25/12 0424  GLUCAP 97 99 118* 110* 114*   CXR:   ASSESSMENT / PLAN:  PULMONARY A: Post procedure respiratory failure, extubated 6/18 H/o controlled Asthma, not in exacerbation P:   - Albuteral q4h prn, no indication for steroids at this time.  CARDIOVASCULAR A: SAH P:  - MAP goals per IR / NS post stent, in next 48 hrs, HHH, allow MAP to rise, pending stent status. - Heparin required per IR, see heme. - Tele. - Nimodipine, for 21 days.  RENAL A:  Hypokalemia, hyponatremia, improved  Metabolic acidosis, lactic acidosis on presentation etiology unclear, resolved P:   - Follow BMP in AM. - Replace lytes prn.  GASTROINTESTINAL A:  High risk stress ulcer P:   - PPI. - NPO.  HEMATOLOGIC A:  DVt prevention, SAH P:  - SCD. - Follow CBC.  INFECTIOUS A: no evidence infection at this  time, UA w trace LE 6/17 P:   - BC sent and negative.  ENDOCRINE A:  High risk hyperglycemia P:   - SSI protocol.  NEUROLOGIC A:  SAH, s/o Pipeline stent with thrombus P:   - ASA, plavix, hep per IR.  TODAY'S SUMMARY: Hx asthma, not currently exacerbated. Vent due to ICA aneurysm now extubated and doing well, SAH s/p stenting. On heparin + ASA + plavix. Extubation 6/18. Lactic acidosis unclear etiology.  I have personally obtained a history, examined the patient, evaluated laboratory and imaging results, formulated the assessment and plan and placed orders.  Alyson Reedy, M.D. [facility]  Pulmonary/Critical Care Medicine. Pager: [phone]. After hours pager: [phone].

## 2012-10-29 LAB — BASIC METABOLIC PANEL
CO2: 19 mEq/L (ref 19–32)
Calcium: 9.7 mg/dL (ref 8.4–10.5)
Creatinine, Ser: 0.69 mg/dL (ref 0.50–1.10)
Glucose, Bld: 106 mg/dL — ABNORMAL HIGH (ref 70–99)
Sodium: 134 mEq/L — ABNORMAL LOW (ref 135–145)

## 2012-10-29 LAB — CBC
Hemoglobin: 11.9 g/dL — ABNORMAL LOW (ref 12.0–15.0)
MCH: 29.6 pg (ref 26.0–34.0)
MCV: 86.1 fL (ref 78.0–100.0)
RBC: 4.02 MIL/uL (ref 3.87–5.11)

## 2012-10-29 LAB — CULTURE, BLOOD (ROUTINE X 2): Culture: NO GROWTH

## 2012-10-29 LAB — PHOSPHORUS: Phosphorus: 4.1 mg/dL (ref 2.3–4.6)

## 2012-10-29 NOTE — Progress Notes (Addendum)
Physical Therapy Treatment Patient Details Name: Anita Rhodes MRN: [id number] DOB: 1975-09-21 Today's Date: 10/29/2012 Time: 4098-1191 PT Time Calculation (min): 26 min  PT Assessment / Plan / Recommendation Comments on Treatment Session  Patient progressing to needing only one person assist and able to walk outside the room.  Pain in left LE and back mimics nerve root irritation and educated pt on frequent movement to prevent stiffness and static inflammation in nerve root area.  Feel eventually may need ortho consult to investigate cause of back and left LE pain.    Follow Up Recommendations  Home health PT;Supervision/Assistance - 24 hour     Does the patient have the potential to tolerate intense rehabilitation   N/A  Barriers to Discharge  None      Equipment Recommendations  Rolling walker with 5" wheels    Recommendations for Other Services   Ortho consult  Frequency Min 4X/week   Plan Discharge plan remains appropriate    Precautions / Restrictions Precautions Precautions: Fall   Pertinent Vitals/Pain Left LE pain and back pain    Mobility  Bed Mobility Bed Mobility: Sit to Supine Sit to Supine: 5: Supervision;HOB flat Transfers Sit to Stand: 4: Min assist;From chair/3-in-1 Stand to Sit: To chair/3-in-1;To bed;4: Min assist Details for Transfer Assistance: slow and antalgic with movements; keeps back arched in transitions Ambulation/Gait Ambulation/Gait Assistance: 4: Min assist Ambulation Distance (Feet): 160 Feet Assistive device: Rolling walker Ambulation/Gait Assistance Details: cues for sequence with turns; heavy UE reliance with ambulation Gait Pattern: Step-through pattern;Decreased stride length      PT Goals Acute Rehab PT Goals Pt will go Supine/Side to Sit: Independently PT Goal: Supine/Side to Sit - Progress: Progressing toward goal Pt will go Sit to Stand: Independently PT Goal: Sit to Stand - Progress: Progressing toward goal Pt will  Ambulate: >150 feet;with modified independence;with least restrictive assistive device PT Goal: Ambulate - Progress: Progressing toward goal Pt will Perform Home Exercise Program: Independently PT Goal: Perform Home Exercise Program - Progress: Progressing toward goal  Visit Information  Last PT Received On: 10/29/12 Assistance Needed: +1    Subjective Data  Subjective: Back really bothering me.   Cognition  Cognition Arousal/Alertness: Awake/alert Behavior During Therapy: WFL for tasks assessed/performed Overall Cognitive Status: Within Functional Limits for tasks assessed       End of Session PT - End of Session Equipment Utilized During Treatment: Gait belt Activity Tolerance: Patient limited by pain Patient left: in bed;with call bell/phone within reach Nurse Communication: Mobility status   GP     [facility] 10/29/2012, 12:23 PM Sheran Lawless, PT [phone] 10/29/2012

## 2012-10-29 NOTE — Progress Notes (Signed)
Overall stable. No new issues. Headache better controlled. Back pain less of an issue. No current complains of numbness paresthesias or weakness. Was able to get up and ambulate with therapy today.  Afebrile. Vitals are stable. Urine output good. Fluid balance remains positive. Labs stable. On exam she is awake and alert she is oriented and appropriate. Her cranial nerve function is intact. Her motor and sensory function of her extremities are normal. Chest and abdomen are benign.  Making good progress. Plan to transfer to floor today.

## 2012-10-30 ENCOUNTER — Inpatient Hospital Stay (HOSPITAL_COMMUNITY): Payer: 59

## 2012-10-30 MED ORDER — GADOBENATE DIMEGLUMINE 529 MG/ML IV SOLN: 20.0000 mL | Freq: Once | INTRAVENOUS | Status: AC | PRN

## 2012-10-30 MED ORDER — DIAZEPAM 5 MG/ML IJ SOLN
5.0000 mg | Freq: Once | INTRAMUSCULAR | Status: AC
  Administered 2012-10-30: 5 mg via INTRAVENOUS

## 2012-10-30 MED ORDER — GADOBENATE DIMEGLUMINE 529 MG/ML IV SOLN
18.0000 mL | Freq: Once | INTRAVENOUS | Status: AC
  Administered 2012-10-30: 18 mL via INTRAVENOUS

## 2012-10-30 MED ORDER — DIAZEPAM 5 MG PO TABS
5.0000 mg | ORAL_TABLET | Freq: Once | ORAL | Status: AC
  Administered 2012-10-30: 5 mg via ORAL
  Filled 2012-10-30: qty 1

## 2012-10-30 NOTE — Progress Notes (Signed)
Patient still complains of a lot of left lower back pain and left flank pain. She has some radiation to her left lower extremity. She is quite uncomfortable with this. She is not running fevers but does have a urinalysis consistent with UTI. She's been started on antibiotics by critical care. The pain will radiate into her left leg and she does note some numbness in her left lower extremity.  On examination she is currently awake and alert. She is oriented and appropriate. She is not appear toxic. She does pill are mildly uncomfortable. Her lumbar spine is minimally tender. There is no bony abnormality. Straight raising is mildly positive left negative on the right. Her motor and sensory examination are currently intact bilaterally. Reflexes are normal.  I suspect that most for back and radicular symptoms are secondary to subarachnoid blood which is irritating her lumbosacral nerve roots that being said I cannot rule out disc herniation or other significant structural problem. I think given the severe nature of her symptoms and the patient been prominent over the last few days then a workup with an MRI scan of her lumbar spine is indicated. Otherwise she is doing well with regard to her subarachnoid hemorrhage and subsequent internal carotid artery reconstruction.

## 2012-10-30 NOTE — Progress Notes (Signed)
Physical Therapy Treatment Patient Details Name: Anita Rhodes MRN: [id number] DOB: 04/04/1976 Today's Date: 10/30/2012 Time: 4098-1191 PT Time Calculation (min): 24 min  PT Assessment / Plan / Recommendation  PT Comments   Patient progressing with increased tolerance to ambulation and decreased support needed.  Still with LE's giving away at times and use of UE's on walker due to LE fatigue and weakness.  Will need spouse to assist for safety with mobility at d/c.  Follow Up Recommendations  Home health PT;Supervision/Assistance - 24 hour     Does the patient have the potential to tolerate intense rehabilitation   N/A  Barriers to Discharge  None      Equipment Recommendations  Rolling walker with 5" wheels    Recommendations for Other Services  None  Frequency Min 4X/week   Progress towards PT Goals Progress towards PT goals: Progressing toward goals  Plan Current plan remains appropriate    Precautions / Restrictions Precautions Precautions: Fall   Pertinent Vitals/Pain About 5/10 left hip    Mobility  Bed Mobility Bed Mobility: Supine to Sit Supine to Sit: 6: Modified independent (Device/Increase time);HOB elevated Details for Bed Mobility Assistance: pt moving quickly, need to use the bathroom Transfers Sit to Stand: 5: Supervision;From bed;From elevated surface Stand to Sit: 4: Min assist;To toilet;To chair/3-in-1 Details for Transfer Assistance: assist for safety and cues for UE use on armrests to sit Ambulation/Gait Ambulation/Gait Assistance: 4: Min guard Ambulation Distance (Feet): 200 Feet Assistive device: Rolling walker Ambulation/Gait Assistance Details: back arched, hips flexed and increased UE use        PT Diagnosis:    PT Problem List:   PT Treatment Interventions:     PT Goals    Visit Information  Last PT Received On: 10/30/12    Subjective Data   Reports Valium doing better job controlling pain   Cognition   Cognition Arousal/Alertness: Awake/alert Behavior During Therapy: WFL for tasks assessed/performed Overall Cognitive Status: Within Functional Limits for tasks assessed    Balance  Static Standing Balance Static Standing - Balance Support: During functional activity;Bilateral upper extremity supported Static Standing - Level of Assistance: 5: Stand by assistance Static Standing - Comment/# of Minutes: standing elbows resting on walker to use hand sanitizer  End of Session PT - End of Session Equipment Utilized During Treatment: Gait belt Activity Tolerance: Patient limited by pain Patient left: in chair;with call bell/phone within reach   GP     [name] 10/30/2012, 11:38 AM Sheran Lawless, PT [phone] 10/30/2012

## 2012-10-30 NOTE — Progress Notes (Signed)
Results of 10/27/12 UA noted in pt with lower back pain. Discussed with Molli Knock MD over phone.

## 2012-10-30 NOTE — Progress Notes (Signed)
PULMONARY  / CRITICAL CARE MEDICINE  Name: Anita Rhodes MRN: [id number] DOB: [birth year]    ADMISSION DATE:  10/21/2012 CONSULTATION DATE:  10/22/12  REFERRING MD :  Allie Dimmer / Pool PRIMARY SERVICE: Pool  CHIEF COMPLAINT:  Acute resp failure, SAH  BRIEF PATIENT DESCRIPTION: 37 yr old controlled asthma, ICA aneurysm / SAH, stented, resp failure  SIGNIFICANT EVENTS / STUDIES:  6/17 ct head - High attenuation material consistent with subarachnoid hemorrhage is noted filling the suprasellar cistern extending inferiorly along the anterior surface of the brainstem and upper cervical spinal cord. Some of the subarachnoid hemorrhage extends anterior to the left temporal lobe and along the undersurface of the left cerebral hemisphere. 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 The largest areas with subarachnoid hemorrhage have not significantly changed. No significant hydrocephalus. 6/17- Resp failure, vent  LINES / TUBES: 6/17 ETT>>> 6/18 6/17 a line left rad>>>  6/17 rt fem sheeth>>> 6/18  CULTURES: Blood 6/17 >>   ANTIBIOTICS: Cefazolin x 1 6/17  HISTORY OF PRESENT ILLNESS:   37 year old female presents with sudden onset of severe headache yesterday evening around 6 PM. Head CT scan which demonstrated evidence of a fairly large amount of blood within her left-sided carotid cistern extending into her sylvian fissure. No evidence of hydrocephalus. No evidence of intraparenchymal hemorrhage.  She has a positive family history of aneurysm with a father died from cerebral aneurysm. Positive for photophobia. She denies any diplopia. She's having no visual disturbances. She denies any numbness paresthesias or weakness. She's had no seizures. To IR, Pipeline stent placed, some clot noted,  required asa, plavix, heparin. transferred to ICu on vent. No active wheezng reported Reports use alb 4 x per year.  SUBJECTIVE:  Sleepy, c/o some HA  VITAL SIGNS: Temp:  [97.8 F (36.6 C)-99.4 F (37.4 C)] 97.8 F (36.6 C) (06/25 0813) Pulse Rate:  [71-123] 87 (06/25 0900) Resp:  [13-18] 15 (06/25 0900) BP: (119-152)/(77-95) 125/77 mmHg (06/25 0900) SpO2:  [98 %-100 %] 98 % (06/25 0900) HEMODYNAMICS:   VENTILATOR SETTINGS:   INTAKE / OUTPUT: Intake/Output     06/24 0701 - 06/25 0700 06/25 0701 - 06/26 0700   P.O. 600 120   I.V. (mL/kg) 4200 (50.3) 2450 (29.3)   IV Piggyback 105    Total Intake(mL/kg) 4905 (58.7) 2570 (30.8)   Urine (mL/kg/hr) 3000 (1.5)    Stool 2 (0)    Total Output 3002     Net +1903 +2570        Urine Occurrence 1 x     PHYSICAL EXAMINATION: Gen: obese woman, arousable and follows command but lethargic. HEENT: NCAT, PERRL. PULM: CTA B, good air entry bilaterally. CV: RRR, no mgr. AB: BS+, soft, nontender Ext: warm, no edema Neuro: wakes easily, interacts appropriately, follows commands, moves all ext Derm: no rash or skin breakdown  LABS:  Recent Labs Lab 10/24/12 0540 10/24/12 0900 10/25/12 0545 10/29/12 0350  HGB  --   --  11.9* 11.9*  WBC  --   --  8.9 9.1  PLT  --   --  264 288  NA 132*  --  136 134*  K 3.4*  --  3.7 4.0  CL 102  --  106 103  CO2 18*  --  18* 19  GLUCOSE 106*  --  110* 106*  BUN 3*  --  5* 9  CREATININE 0.60  --  0.69 0.69  CALCIUM 9.0  --  9.1 9.7  MG 2.0  --   --  2.1  PHOS 3.1  --   --  4.1  AST  --   --  45*  --   ALT  --   --  45*  --   ALKPHOS  --   --  82  --   BILITOT  --   --  0.3  --   PROT  --   --  7.5  --   ALBUMIN  --   --  3.5  --   LATICACIDVEN  --  1.6 1.6  --     Recent Labs Lab 10/24/12 1230 10/24/12 1556 10/24/12 1933 10/25/12 0025 10/25/12 0424  GLUCAP 97 99 118* 110* 114*   CXR:   ASSESSMENT / PLAN:  PULMONARY A: Post procedure respiratory failure, extubated 6/18 H/o  controlled Asthma, not in exacerbation. P:   - Albuteral q4h prn, no indication for steroids at this time. - Will likely need a sleep study as outpatient post discharge, patient clearly obstructs when sleeping.  CARDIOVASCULAR A: SAH P:  - BP management per NS and IR. - Heparin d/ced on ASA and plavix. - Tele. - Nimodipine, for 21 days.  RENAL A:  Hypokalemia, hyponatremia, improved  Metabolic acidosis, lactic acidosis on presentation etiology unclear, resolved P:   - Follow BMP in AM. - Replace lytes as needed.  GASTROINTESTINAL A:  High risk stress ulcer P:   - PPI. - Diet as ordered.  HEMATOLOGIC A:  DVt prevention, SAH P:  - SCD. - Follow CBC. - ASA/Plavix.  INFECTIOUS A: no evidence infection at this time, UA w trace LE 6/17 P:   - BC sent and negative.  ENDOCRINE A:  High risk hyperglycemia P:   - SSI protocol.  NEUROLOGIC A:  SAH, s/o Pipeline stent with thrombus P:   - ASA, plavix, hep per IR.  TODAY'S SUMMARY: Hx asthma, not currently exacerbated. Vent due to ICA aneurysm now extubated and doing well, SAH s/p stenting. On heparin + ASA + plavix. Extubation 6/18, has transfer orders to the floor, PCCM will sign off, please call back if needed.  I have personally obtained a history, examined the patient, evaluated laboratory and imaging results, formulated the assessment and plan and placed orders.  Alyson Reedy, M.D. [facility] Pulmonary/Critical Care Medicine. Pager: [phone]. After hours pager: [phone].

## 2012-10-30 NOTE — Progress Notes (Signed)
Pt crying, inconsolable, lower back and left hip hurting severely. IV dilaudid and toradol given. Heat applied. Pt cont. To cry, moan, with stabbing back pain. Dr. Jordan Likes notified. Orders received.

## 2012-10-31 MED ORDER — DIAZEPAM 5 MG/ML IJ SOLN
2.5000 mg | Freq: Once | INTRAMUSCULAR | Status: AC
  Administered 2012-10-31: 2.5 mg via INTRAVENOUS
  Filled 2012-10-31: qty 2

## 2012-10-31 NOTE — Progress Notes (Signed)
PT Cancellation Note  Patient Details Name: Anita Rhodes MRN: [id number] DOB: October 29, 1975   Cancelled Treatment:    Reason Eval/Treat Not Completed: Pain limiting ability to participate (Pt reports severe spasms. ) Pt reports that valium helped her spasms yesterday more than current medications.   Kenden Brandt 10/31/2012, 2:10 PM

## 2012-10-31 NOTE — Progress Notes (Signed)
Feeling some better. Back pain better controlled. Less radicular pain. Headaches stable. No other problems.  Afebrile. Vital stable. Urine output good. Awake and alert. Oriented and appropriate. Motor and sensory function intact.  MRI scan lumbar spine demonstrates no evidence of disc herniation spinal stenosis or other major structural problem he does demonstrate evidence of significant amount of subarachnoid blood layering in the lumbosacral region.  Overall progressing well. Back pain and radicular symptoms likely secondary to irritation from her subarachnoid hemorrhage which is resolving. Continue efforts at mobilization. Possible discharge tomorrow.

## 2012-11-01 MED ORDER — OXYCODONE-ACETAMINOPHEN 5-325 MG PO TABS
1.0000 | ORAL_TABLET | ORAL | Status: DC | PRN
  Administered 2012-11-01 (×4): 2 via ORAL
  Administered 2012-11-02: 1 via ORAL
  Filled 2012-11-01 (×5): qty 2

## 2012-11-01 MED ORDER — DIAZEPAM 5 MG PO TABS
5.0000 mg | ORAL_TABLET | Freq: Four times a day (QID) | ORAL | Status: DC | PRN
  Administered 2012-11-01: 5 mg via ORAL
  Administered 2012-11-01 – 2012-11-02 (×2): 10 mg via ORAL
  Filled 2012-11-01: qty 2
  Filled 2012-11-01: qty 1
  Filled 2012-11-01: qty 2

## 2012-11-01 MED ORDER — DEXAMETHASONE 4 MG PO TABS
4.0000 mg | ORAL_TABLET | Freq: Four times a day (QID) | ORAL | Status: DC
  Administered 2012-11-01 – 2012-11-02 (×4): 4 mg via ORAL
  Filled 2012-11-01 (×8): qty 1

## 2012-11-01 MED ORDER — LEVETIRACETAM 500 MG PO TABS
500.0000 mg | ORAL_TABLET | Freq: Two times a day (BID) | ORAL | Status: DC
  Administered 2012-11-01 – 2012-11-02 (×3): 500 mg via ORAL
  Filled 2012-11-01 (×4): qty 1

## 2012-11-01 NOTE — Progress Notes (Signed)
Physical Therapy Treatment Patient Details Name: JEARLDINE CASSADY MRN: [id number] DOB: 05-31-1975 Today's Date: 11/01/2012 Time: 2130-8657 PT Time Calculation (min): 33 min  PT Assessment / Plan / Recommendation  PT Comments   Pt progressing with mobility but still limited by back and LE pain. Pt educated for safety with transfers and DME  Follow Up Recommendations  Home health PT;Supervision for mobility/OOB     Does the patient have the potential to tolerate intense rehabilitation     Barriers to Discharge        Equipment Recommendations  Rolling walker with 5" wheels;3in1 (PT)    Recommendations for Other Services    Frequency Min 3X/week   Progress towards PT Goals Progress towards PT goals: Progressing toward goals  Plan Discharge plan needs to be updated    Precautions / Restrictions Precautions Precautions: Fall   Pertinent Vitals/Pain 8/10 left leg and back pain, premedicated, repositioned    Mobility  Bed Mobility Supine to Sit: 6: Modified independent (Device/Increase time);HOB flat;With rails Transfers Sit to Stand: 5: Supervision;From bed Stand to Sit: 6: Modified independent (Device/Increase time);To chair/3-in-1 Details for Transfer Assistance: cueing for hand placement Ambulation/Gait Ambulation/Gait Assistance: 4: Min guard Ambulation Distance (Feet): 300 Feet Assistive device: Rolling walker Ambulation/Gait Assistance Details: increased bil UE use, hips flexed with cueing for posture and position in RW Gait Pattern: Step-through pattern;Decreased stride length Gait velocity: decreased Stairs: No    Exercises General Exercises - Lower Extremity Long Arc Quad: AROM;Left;10 reps;Seated Hip Flexion/Marching: AROM;Right;10 reps;Seated   PT Diagnosis:    PT Problem List:   PT Treatment Interventions:     PT Goals (current goals can now be found in the care plan section)    Visit Information  Last PT Received On: 11/01/12 Assistance Needed: +1    Subjective Data      Cognition  Cognition Arousal/Alertness: Awake/alert Behavior During Therapy: WFL for tasks assessed/performed Overall Cognitive Status: Within Functional Limits for tasks assessed    Balance     End of Session PT - End of Session Equipment Utilized During Treatment: Gait belt Activity Tolerance: Patient limited by pain Patient left: in chair;with call bell/phone within reach Nurse Communication: Mobility status   GP     Delorse Lek 11/01/2012, 2:39 PM Delaney Meigs, PT [phone]

## 2012-11-01 NOTE — Progress Notes (Signed)
No new events overnight. Patient still limited by lumbar spasms and intermittent radicular pain. Headache fairly well controlled.  Afebrile. Vitals are stable. Urine output good. Awake and alert. Oriented and appropriate. Motor and sensory function intact.  Overall stable following subarachnoid hemorrhage and internal carotid artery pipeline reconstruction. Main issue continues to be lumbar pain and radicular irritation secondary to subarachnoid blood within her lumbosacral cistern. We'll add Decadron to see this helpsher symptoms. Continue efforts at mobilization.

## 2012-11-02 ENCOUNTER — Telehealth (HOSPITAL_COMMUNITY): Payer: Self-pay | Admitting: Emergency Medicine

## 2012-11-02 MED ORDER — OXYCODONE-ACETAMINOPHEN 5-325 MG PO TABS: 1.0000 | ORAL_TABLET | ORAL | Status: DC | PRN

## 2012-11-02 MED ORDER — CLOPIDOGREL BISULFATE 75 MG PO TABS: 75.0000 mg | ORAL_TABLET | Freq: Every day | ORAL | Status: DC

## 2012-11-02 MED ORDER — LEVETIRACETAM 500 MG PO TABS: 500.0000 mg | ORAL_TABLET | Freq: Two times a day (BID) | ORAL | Status: DC

## 2012-11-02 MED ORDER — ASPIRIN 325 MG PO TABS: 325.0000 mg | ORAL_TABLET | Freq: Every day | ORAL | Status: AC

## 2012-11-02 MED ORDER — NIMODIPINE 30 MG PO CAPS: 60.0000 mg | ORAL_CAPSULE | ORAL | Status: DC

## 2012-11-02 MED ORDER — DIAZEPAM 5 MG PO TABS: 5.0000 mg | ORAL_TABLET | Freq: Four times a day (QID) | ORAL | Status: DC | PRN

## 2012-11-02 NOTE — Progress Notes (Signed)
Patient discharge education and paperwork given.  Patient's rolling walker and commode delivered to her before she left.  No IV.  Lance Bosch, RN

## 2012-11-02 NOTE — Discharge Summary (Signed)
Physician Discharge Summary  Patient ID: Anita Rhodes MRN: [id number] DOB/AGE: Oct 01, 1975 37 y.o.  Admit date: 10/21/2012 Discharge date: 11/02/2012  Admission Diagnoses:SAH,1.2.5 mm x 2.5 mm RT ICA post medial wall Aneurysm with prox vessel dysplasia associated with 1.3 aneurysm.  Discharge Diagnoses: SAH,1.2.5 mm x 2.5 mm RT ICA post medial wall Aneurysm with prox vessel dysplasia associated with 1.3 aneurysm.  Principal Problem:   Subarachnoid hemorrhage Active Problems:   Increased anion gap metabolic acidosis   Asthma   New onset of headaches   Respiratory failure, post-operative   Discharged Condition: good  Hospital Course: pt admitted with [facility]  - had angio showing 1.2.5 mm x 2.5 mm RT ICA post medial wall Aneurysm with prox vessel dysplasia associated with 1.3 aneurysm. -  Had endovascular placement of pipeline flow diverter for ruptured blister aneurysm at the post med wall of intracranial RT ICA .  Pt did well  - had ICU observation -  Transferred to floor   - mobalized  - had some sig LBP due to the [facility]  - improving with steroids and valium     Consults: intervent. neuroradiology  Significant Diagnostic Studies: angiography:   Treatments: endovascular placement of pipeline flow diverter for ruptured blister aneurysm at the post med wall of intracranial RT ICA .     Discharge Exam: Blood pressure 139/98, pulse 100, temperature 98.1 F (36.7 C), temperature source Oral, resp. rate 20, height 5\' 4"  (1.626 m), weight 83.5 kg (184 lb 1.4 oz), last menstrual period 10/20/2012, SpO2 100.00%. intact  Disposition: home     Medication List         aspirin 325 MG tablet  Take 1 tablet (325 mg total) by mouth daily with breakfast.     clopidogrel 75 MG tablet  Commonly known as:  PLAVIX  Take 1 tablet (75 mg total) by mouth daily with breakfast.     diazepam 5 MG tablet  Commonly known as:  VALIUM  Take 1-2 tablets (5-10 mg total) by mouth every 6 (six)  hours as needed (spasm).     levETIRAcetam 500 MG tablet  Commonly known as:  KEPPRA  Take 1 tablet (500 mg total) by mouth 2 (two) times daily.     niMODipine 30 MG capsule  Commonly known as:  NIMOTOP  Take 2 capsules (60 mg total) by mouth every 4 (four) hours.     oxyCODONE-acetaminophen 5-325 MG per tablet  Commonly known as:  PERCOCET/ROXICET  Take 1-2 tablets by mouth every 4 (four) hours as needed.       F/U with Dr Jordan Likes in couple weeks and Dr Dr. Corliss Skains - 1-2 weeks   Signed: Clydene Fake, MD 11/02/2012, 9:45 AM

## 2012-11-02 NOTE — Progress Notes (Signed)
Patient dc'd and tried to get her pharmacy to dispense her prescription for Nimotop.  Her pharmacy did not carry it and the pharmacist called 4N charge Rn for recommendations for locating a pharmacy that did carry it.  Tammy, charge RN called Homero Fellers the pharmacist  in our pharmacy and he provided some suggestions.  The patient's pharmacy has called every major pharmacy in the area and nobody carries Nimotop.  I paged Dr. Gerlene Fee, the on call neurosurgeon who called back and said,"I guess she won't take the medication then."  I then asked if there was a substitute medication that he wanted ordered and he said "no."  This information was relayed to the patient.  Lance Bosch, RN

## 2012-11-04 NOTE — Anesthesia Postprocedure Evaluation (Signed)
  Anesthesia Post-op Note  Patient: Anita Rhodes  Procedure(s) Performed: * No procedures listed *  Patient Location: PACU  Anesthesia Type:General  Level of Consciousness: awake  Airway and Oxygen Therapy: Patient Spontanous Breathing  Post-op Pain: mild  Post-op Assessment: Post-op Vital signs reviewed  Post-op Vital Signs: Reviewed  Complications: No apparent anesthesia complications

## 2012-11-17 ENCOUNTER — Encounter (HOSPITAL_COMMUNITY): Payer: Self-pay | Admitting: *Deleted

## 2012-11-17 ENCOUNTER — Emergency Department (HOSPITAL_COMMUNITY): Payer: 59

## 2012-11-17 ENCOUNTER — Emergency Department (HOSPITAL_COMMUNITY)
Admission: EM | Admit: 2012-11-17 | Discharge: 2012-11-17 | Disposition: A | Discharge status: Home / Self Care | Payer: 59 | Attending: Emergency Medicine | Admitting: Emergency Medicine

## 2012-11-17 DIAGNOSIS — I609 Nontraumatic subarachnoid hemorrhage, unspecified: Secondary | ICD-10-CM | POA: Insufficient documentation

## 2012-11-17 DIAGNOSIS — Z7902 Long term (current) use of antithrombotics/antiplatelets: Secondary | ICD-10-CM | POA: Insufficient documentation

## 2012-11-17 DIAGNOSIS — H53149 Visual discomfort, unspecified: Secondary | ICD-10-CM | POA: Insufficient documentation

## 2012-11-17 DIAGNOSIS — H5712 Ocular pain, left eye: Secondary | ICD-10-CM

## 2012-11-17 DIAGNOSIS — Z7982 Long term (current) use of aspirin: Secondary | ICD-10-CM | POA: Insufficient documentation

## 2012-11-17 DIAGNOSIS — Z79899 Other long term (current) drug therapy: Secondary | ICD-10-CM | POA: Insufficient documentation

## 2012-11-17 DIAGNOSIS — H5710 Ocular pain, unspecified eye: Secondary | ICD-10-CM | POA: Insufficient documentation

## 2012-11-17 DIAGNOSIS — H5789 Other specified disorders of eye and adnexa: Secondary | ICD-10-CM | POA: Insufficient documentation

## 2012-11-17 DIAGNOSIS — J45909 Unspecified asthma, uncomplicated: Secondary | ICD-10-CM | POA: Insufficient documentation

## 2012-11-17 LAB — COMPREHENSIVE METABOLIC PANEL
ALT: 67 U/L — ABNORMAL HIGH (ref 0–35)
Alkaline Phosphatase: 89 U/L (ref 39–117)
BUN: 10 mg/dL (ref 6–23)
CO2: 25 mEq/L (ref 19–32)
GFR calc Af Amer: >90 mL/min (ref >90)
GFR calc non Af Amer: >90 mL/min (ref >90)
Glucose, Bld: 94 mg/dL (ref 70–99)
Potassium: 3.8 mEq/L (ref 3.5–5.1)
Total Bilirubin: 0.3 mg/dL (ref 0.3–1.2)
Total Protein: 7.1 g/dL (ref 6.0–8.3)

## 2012-11-17 LAB — CBC
HCT: 34.7 % — ABNORMAL LOW (ref 36.0–46.0)
Hemoglobin: 11.4 g/dL — ABNORMAL LOW (ref 12.0–15.0)
MCHC: 32.9 g/dL (ref 30.0–36.0)
RBC: 3.83 MIL/uL — ABNORMAL LOW (ref 3.87–5.11)

## 2012-11-17 LAB — PROTIME-INR: Prothrombin Time: 11.8 seconds (ref 11.6–15.2)

## 2012-11-17 MED ORDER — HYDROMORPHONE HCL PF 1 MG/ML IJ SOLN
1.0000 mg | Freq: Once | INTRAMUSCULAR | Status: AC
  Administered 2012-11-17: 1 mg via INTRAVENOUS
  Filled 2012-11-17: qty 1

## 2012-11-17 MED ORDER — TETRACAINE HCL 0.5 % OP SOLN
2.0000 [drp] | Freq: Once | OPHTHALMIC | Status: DC
  Filled 2012-11-17: qty 2

## 2012-11-17 MED ORDER — IOHEXOL 300 MG/ML  SOLN
100.0000 mL | Freq: Once | INTRAMUSCULAR | Status: AC | PRN
  Administered 2012-11-17: 100 mL via INTRAVENOUS

## 2012-11-17 MED ORDER — SODIUM CHLORIDE 0.9 % IV SOLN
INTRAVENOUS | Status: DC
  Administered 2012-11-17: 14:00:00 via INTRAVENOUS

## 2012-11-17 MED ORDER — GADOBENATE DIMEGLUMINE 529 MG/ML IV SOLN
15.0000 mL | Freq: Once | INTRAVENOUS | Status: AC | PRN
  Administered 2012-11-17: 15 mL via INTRAVENOUS

## 2012-11-17 MED ORDER — ONDANSETRON HCL 4 MG/2ML IJ SOLN
4.0000 mg | Freq: Once | INTRAMUSCULAR | Status: AC
  Administered 2012-11-17: 4 mg via INTRAVENOUS
  Filled 2012-11-17: qty 2

## 2012-11-17 MED ORDER — LORAZEPAM 2 MG/ML IJ SOLN
1.0000 mg | Freq: Once | INTRAMUSCULAR | Status: AC
  Administered 2012-11-17: 1 mg via INTRAVENOUS
  Filled 2012-11-17: qty 1

## 2012-11-17 NOTE — ED Notes (Signed)
Patient transported to CT 

## 2012-11-17 NOTE — ED Notes (Signed)
Patient states discharged from hospital 2 weeks ago due to having a ruptured aneurysm in June.

## 2012-11-17 NOTE — Progress Notes (Signed)
10:11 PM MRI showed possible aneurysm in left supraclinoid area.  I reviewed this finding with Tressie Stalker, M.D., neurosurgeon covering for H. Alfonse Flavors, M.D., pt's neurosurgeon.  After review, Dr. Lovell Sheehan advised that pt had no bleeding and no stroke, so it is safe to go home and discuss this finding with Dr. Jordan Likes in the office this coming week.

## 2012-11-17 NOTE — ED Notes (Signed)
Pt remains in Ct at this time

## 2012-11-17 NOTE — ED Notes (Signed)
Pt reports 2 days ago she began to have L eye pain with photophobia.  No distress noted.  Ambulatory without difficulty.

## 2012-11-17 NOTE — ED Notes (Signed)
Spoke with MRI.  Technician stated it would be about 7pm before pt would be picked up for scans.

## 2012-11-17 NOTE — ED Provider Notes (Signed)
MSE performed by me.  Pt who recently diagnosed with SAH 2/2 brain aneurysm less than a month ago and was discharged from hospital 2 weeks ago presents c/o progressive worsening L eye pain, with pain radiates down to her neck. Pt report photophobia, blurry vision, stabbing neck pain.  On exam pt appears uncomfortable, with L eye ptosis.  L eye is injected, photophobia with light exam, pupil 4+ reactive on R side, 2+ reactive on L.  No carotid bruits.  No significant midline spine tenderness.    Plan to move pt to main side for further evaluation considering her recent hospital course and complications.  Care discussed with attending.    Fayrene Helper, PA-C 11/17/12 1143

## 2012-11-17 NOTE — ED Provider Notes (Addendum)
History    CSN: [id number] Arrival date & time 11/17/12  1018  First MD Initiated Contact with Patient 11/17/12 1126     Chief Complaint  Patient presents with  . Eye Pain   (Consider location/radiation/quality/duration/timing/severity/associated sxs/prior Treatment) Patient is a 37 y.o. female presenting with eye pain. The history is provided by the patient.  Eye Pain Pertinent negatives include no chest pain, no abdominal pain, no headaches and no shortness of breath.  pt c/o left eye pain for the past 2 days. Symptoms gradual in onset, slowly worse, constant. Mild erythema to left eye. No injury, either chemical or physical. No fb sensation. No change in vision. +mild photophobia. ?mild discharge/tearing, no grossly purulent drainage or matting of lashes. Denies hx same symptoms. Pt s/p sah/blister aneurysm w subsequent IR procedure approximately 1 month ago.  Pt notes current eye symptoms much different/unlike acute onset severe headache associated w her SAH.  No contact or corrective lens use.  Denies URI c/o. No fever or chills. No nv. No neck pain or stiffness.   Past Medical History  Diagnosis Date  . Asthma    Past Surgical History  Procedure Laterality Date  . Arthroscopic knee    . Other surgical history      aneurysm repair.   Family History  Problem Relation Age of Onset  . Hypertension Mother   . Hyperlipidemia Father   . Hypertension Other    History  Substance Use Topics  . Smoking status: Never Smoker   . Smokeless tobacco: Not on file  . Alcohol Use: Yes     Comment: social   OB History   Grav Para Term Preterm Abortions TAB SAB Ect Mult Living                 Review of Systems  Constitutional: Negative for fever and chills.  HENT: Negative for rhinorrhea, neck pain, neck stiffness and sinus pressure.   Eyes: Positive for pain. Negative for visual disturbance.  Respiratory: Negative for shortness of breath.   Cardiovascular: Negative for chest  pain.  Gastrointestinal: Negative for nausea, vomiting and abdominal pain.  Genitourinary: Negative for flank pain.  Musculoskeletal: Negative for gait problem.  Skin: Negative for rash.  Neurological: Negative for dizziness, speech difficulty, weakness, numbness and headaches.  Hematological: Does not bruise/bleed easily.  Psychiatric/Behavioral: Negative for confusion.    Allergies  Review of patient's allergies indicates no known allergies.  Home Medications   Current Outpatient Rx  Name  Route  Sig  Dispense  Refill  . albuterol (PROVENTIL HFA;VENTOLIN HFA) 108 (90 BASE) MCG/ACT inhaler   Inhalation   Inhale 2 puffs into the lungs every 6 (six) hours as needed for wheezing or shortness of breath.         Marland Kitchen aspirin 325 MG tablet   Oral   Take 1 tablet (325 mg total) by mouth daily with breakfast.   30 tablet   0   . clopidogrel (PLAVIX) 75 MG tablet   Oral   Take 1 tablet (75 mg total) by mouth daily with breakfast.   30 tablet   1   . diazepam (VALIUM) 5 MG tablet   Oral   Take 1-2 tablets (5-10 mg total) by mouth every 6 (six) hours as needed (spasm).   41 tablet   0   . levETIRAcetam (KEPPRA) 500 MG tablet   Oral   Take 1 tablet (500 mg total) by mouth 2 (two) times daily.   60 tablet  2   . oxyCODONE-acetaminophen (PERCOCET/ROXICET) 5-325 MG per tablet   Oral   Take 1-2 tablets by mouth every 4 (four) hours as needed.   41 tablet   0    BP 139/89  Pulse 106  Temp(Src) 98.1 F (36.7 C) (Oral)  Resp 18  SpO2 100%  LMP 10/20/2012 Physical Exam  Nursing note and vitals reviewed. Constitutional: She is oriented to person, place, and time. She appears well-developed and well-nourished. No distress.  HENT:  Head: Atraumatic.  Nose: Nose normal.  Mouth/Throat: Oropharynx is clear and moist.  No sinus or temporal tenderness.  Eyes: Conjunctivae and EOM are normal. Pupils are equal, round, and reactive to light. No scleral icterus.  ?mild swelling  left eye lid. Tenderness about the left eye and lid. Conj injected. Lid everted, no fb. Limited fundoscopic exam/pt unable to remain still/comply w exam, no gross hem or pe noted.   Neck: Neck supple. No tracheal deviation present. No thyromegaly present.  No stiffness or rigidity.   Cardiovascular: Normal rate, regular rhythm, normal heart sounds and intact distal pulses.  Exam reveals no gallop and no friction rub.   No murmur heard. Pulmonary/Chest: Effort normal and breath sounds normal. No respiratory distress.  Abdominal: Soft. Normal appearance and bowel sounds are normal. She exhibits no distension. There is no tenderness.  Genitourinary:  No cva tenderness.  Musculoskeletal: Normal range of motion. She exhibits no edema and no tenderness.  Neurological: She is alert and oriented to person, place, and time. No cranial nerve deficit.  Motor intact bilaterally. Steady gait.   Skin: Skin is warm and dry. No rash noted. She is not diaphoretic.  Psychiatric: She has a normal mood and affect.    ED Course  Procedures (including critical care time)  Results for orders placed during the hospital encounter of 11/17/12  CBC      Result Value Range   WBC 6.4  4.0 - 10.5 K/uL   RBC 3.83 (*) 3.87 - 5.11 MIL/uL   Hemoglobin 11.4 (*) 12.0 - 15.0 g/dL   HCT 40.9 (*) 81.1 - 91.4 %   MCV 90.6  78.0 - 100.0 fL   MCH 29.8  26.0 - 34.0 pg   MCHC 32.9  30.0 - 36.0 g/dL   RDW 78.2  95.6 - 21.3 %   Platelets 271  150 - 400 K/uL  COMPREHENSIVE METABOLIC PANEL      Result Value Range   Sodium 137  135 - 145 mEq/L   Potassium 3.8  3.5 - 5.1 mEq/L   Chloride 103  96 - 112 mEq/L   CO2 25  19 - 32 mEq/L   Glucose, Bld 94  70 - 99 mg/dL   BUN 10  6 - 23 mg/dL   Creatinine, Ser 0.86  0.50 - 1.10 mg/dL   Calcium 57.8  8.4 - 46.9 mg/dL   Total Protein 7.1  6.0 - 8.3 g/dL   Albumin 3.4 (*) 3.5 - 5.2 g/dL   AST 39 (*) 0 - 37 U/L   ALT 67 (*) 0 - 35 U/L   Alkaline Phosphatase 89  39 - 117 U/L    Total Bilirubin 0.3  0.3 - 1.2 mg/dL   GFR calc non Af Amer >90  >90 mL/min   GFR calc Af Amer >90  >90 mL/min  PROTIME-INR      Result Value Range   Prothrombin Time 11.8  11.6 - 15.2 seconds   INR 0.88  0.00 - 1.49  Ct Head Wo Contrast  11/17/2012   *RADIOLOGY REPORT*  Clinical Data:  Recent subarachnoid hemorrhage.  Now with  left eye pain and swelling  CT HEAD WITHOUT CONTRAST  Technique: Contiguous axial images were obtained from the base of the skull through the vertex without intravenous contrast.  Comparison:  CT head 10/22/2012  Findings:  Subarachnoid hemorrhage seen on the prior study has resolved.  On the current study there is no acute hemorrhage. Negative for acute infarct or mass.  Ventricle size is normal. There is a stent to the left cavernous carotid artery.  IMPRESSION: No acute abnormality.  Negative for hemorrhage or hydrocephalous  *RADIOLOGY REPORT*  Clinical Data:  Recent subarachnoid hemorrhage with left carotid stenting  CT ORBITS WITH CONTRAST  Technique:  Multidetector CT imaging of the orbits was performed following the bolus administration of intravenous contrast.  Contrast: OMNIPAQUE IOHEXOL 300 MG/ML  SOLN  Comparison:   None.  Findings:  There is a stent in the left cavernous carotid for recent treatment of aneurysm rupture.  The cavernous sinus enhances normally without evidence of enlargement or thrombosis.  Superior ophthalmic vein is symmetric in size bilaterally.  The orbit is symmetric and appears normal bilaterally.  No orbital mass or edema.  No enlargement of the superior ophthalmic vein. The muscles are symmetric and normal.  No significant bony abnormality.  There is mild mucosal edema in the right maxillary sinuses.  Remaining sinuses are clear.  IMPRESSION: No significant abnormality the orbit.  No evidence of cavernous sinus thrombosis.   Original Report Authenticated By: Janeece Riggers, M.D.   Ct Orbits W/cm  11/17/2012   *RADIOLOGY REPORT*  Clinical  Data:  Recent subarachnoid hemorrhage.  Now with  left eye pain and swelling  CT HEAD WITHOUT CONTRAST  Technique: Contiguous axial images were obtained from the base of the skull through the vertex without intravenous contrast.  Comparison:  CT head 10/22/2012  Findings:  Subarachnoid hemorrhage seen on the prior study has resolved.  On the current study there is no acute hemorrhage. Negative for acute infarct or mass.  Ventricle size is normal. There is a stent to the left cavernous carotid artery.  IMPRESSION: No acute abnormality.  Negative for hemorrhage or hydrocephalous  *RADIOLOGY REPORT*  Clinical Data:  Recent subarachnoid hemorrhage with left carotid stenting  CT ORBITS WITH CONTRAST  Technique:  Multidetector CT imaging of the orbits was performed following the bolus administration of intravenous contrast.  Contrast: OMNIPAQUE IOHEXOL 300 MG/ML  SOLN  Comparison:   None.  Findings:  There is a stent in the left cavernous carotid for recent treatment of aneurysm rupture.  The cavernous sinus enhances normally without evidence of enlargement or thrombosis.  Superior ophthalmic vein is symmetric in size bilaterally.  The orbit is symmetric and appears normal bilaterally.  No orbital mass or edema.  No enlargement of the superior ophthalmic vein. The muscles are symmetric and normal.  No significant bony abnormality.  There is mild mucosal edema in the right maxillary sinuses.  Remaining sinuses are clear.  IMPRESSION: No significant abnormality the orbit.  No evidence of cavernous sinus thrombosis.   Original Report Authenticated By: Janeece Riggers, M.D.       MDM  Iv ns. Dilaudid 1 mg iv. zofran iv. Labs. Ct.  Tetracaine drops applied.  flourescein testing negative for corneal abrasion. Lids everted, no fb. Normal eom. Pt w marked tenderness about the left eye and left eye lid, mild swelling to lid -  will get ct.   IOP checked x 2 w tonopon, readings of 12 and 9.  Reviewed nursing notes and  prior charts for additional history.   Recheck pt comfortable. No headache. No nv. No blurry vision or double vision. Normal/full eom.   Discussed pt with Dr Lesly Dukes, re whether cts adequate to exclude cav ven thrombosis as cause of symptoms - he recommends mra brain and orbits w contrast.   Signed out to Dr Ignacia Palma, if mr normal/neg, d/c w ophthy follow f/u tomorrow.  If positive/abn, rx appropriately.      Suzi Roots, MD 11/17/12 [phone]

## 2012-11-18 NOTE — ED Provider Notes (Signed)
Medical screening examination/treatment/procedure(s) were conducted as a shared visit with non-physician practitioner(s) and myself.  I personally evaluated the patient during the encounter See H and P  Suzi Roots, MD 11/18/12 1443

## 2012-12-20 ENCOUNTER — Telehealth (HOSPITAL_COMMUNITY): Payer: Self-pay | Admitting: Interventional Radiology

## 2012-12-20 ENCOUNTER — Other Ambulatory Visit (HOSPITAL_COMMUNITY): Payer: Self-pay | Admitting: Interventional Radiology

## 2012-12-20 DIAGNOSIS — I729 Aneurysm of unspecified site: Secondary | ICD-10-CM

## 2012-12-20 NOTE — Telephone Encounter (Signed)
Called pt left VM for her to call me and schedule f/u consult visit JMichaux

## 2012-12-24 ENCOUNTER — Ambulatory Visit (HOSPITAL_COMMUNITY)
Admission: RE | Admit: 2012-12-24 | Discharge: 2012-12-24 | Disposition: A | Discharge status: Home / Self Care | Payer: 59 | Source: Ambulatory Visit | Attending: Interventional Radiology | Admitting: Interventional Radiology

## 2012-12-24 DIAGNOSIS — I729 Aneurysm of unspecified site: Secondary | ICD-10-CM

## 2013-01-09 ENCOUNTER — Other Ambulatory Visit (HOSPITAL_COMMUNITY): Payer: Self-pay | Admitting: Interventional Radiology

## 2013-01-09 ENCOUNTER — Other Ambulatory Visit: Payer: Self-pay | Admitting: Radiology

## 2013-01-09 DIAGNOSIS — I729 Aneurysm of unspecified site: Secondary | ICD-10-CM

## 2013-01-09 DIAGNOSIS — I609 Nontraumatic subarachnoid hemorrhage, unspecified: Secondary | ICD-10-CM

## 2013-01-10 ENCOUNTER — Encounter (HOSPITAL_COMMUNITY): Payer: Self-pay | Admitting: Pharmacy Technician

## 2013-01-14 ENCOUNTER — Ambulatory Visit (HOSPITAL_COMMUNITY)
Admission: RE | Admit: 2013-01-14 | Discharge: 2013-01-14 | Disposition: A | Discharge status: Home / Self Care | Payer: 59 | Source: Ambulatory Visit | Attending: Interventional Radiology | Admitting: Interventional Radiology

## 2013-01-14 ENCOUNTER — Other Ambulatory Visit (HOSPITAL_COMMUNITY): Payer: Self-pay | Admitting: Interventional Radiology

## 2013-01-14 ENCOUNTER — Encounter (HOSPITAL_COMMUNITY): Payer: Self-pay

## 2013-01-14 DIAGNOSIS — I609 Nontraumatic subarachnoid hemorrhage, unspecified: Secondary | ICD-10-CM

## 2013-01-14 DIAGNOSIS — I729 Aneurysm of unspecified site: Secondary | ICD-10-CM

## 2013-01-14 DIAGNOSIS — I671 Cerebral aneurysm, nonruptured: Secondary | ICD-10-CM | POA: Insufficient documentation

## 2013-01-14 DIAGNOSIS — Z79899 Other long term (current) drug therapy: Secondary | ICD-10-CM | POA: Insufficient documentation

## 2013-01-14 DIAGNOSIS — Z7982 Long term (current) use of aspirin: Secondary | ICD-10-CM | POA: Insufficient documentation

## 2013-01-14 LAB — BASIC METABOLIC PANEL
CO2: 21 mEq/L (ref 19–32)
Chloride: 103 mEq/L (ref 96–112)
Creatinine, Ser: 0.67 mg/dL (ref 0.50–1.10)
GFR calc Af Amer: >90 mL/min (ref >90)
Potassium: 3.7 mEq/L (ref 3.5–5.1)
Sodium: 136 mEq/L (ref 135–145)

## 2013-01-14 LAB — CBC WITH DIFFERENTIAL/PLATELET
Basophils Relative: 0 % (ref 0–1)
Eosinophils Absolute: 0.1 10*3/uL (ref 0.0–0.7)
Eosinophils Relative: 2 % (ref 0–5)
Hemoglobin: 12 g/dL (ref 12.0–15.0)
MCH: 28.6 pg (ref 26.0–34.0)
MCHC: 33.1 g/dL (ref 30.0–36.0)
Monocytes Relative: 8 % (ref 3–12)
Neutrophils Relative %: 46 % (ref 43–77)
Platelets: 248 10*3/uL (ref 150–400)
RBC: 4.2 MIL/uL (ref 3.87–5.11)
WBC: 6.5 10*3/uL (ref 4.0–10.5)

## 2013-01-14 LAB — APTT: aPTT: 24 seconds (ref 24–37)

## 2013-01-14 LAB — PROTIME-INR: INR: 0.9 (ref 0.00–1.49)

## 2013-01-14 LAB — PLATELET INHIBITION P2Y12: Platelet Function  P2Y12: 2 [PRU] — ABNORMAL LOW (ref 194–418)

## 2013-01-14 MED ORDER — SODIUM CHLORIDE 0.9 % IV SOLN
INTRAVENOUS | Status: DC
  Administered 2013-01-14: 09:00:00 via INTRAVENOUS

## 2013-01-14 MED ORDER — IOHEXOL 300 MG/ML  SOLN
150.0000 mL | Freq: Once | INTRAMUSCULAR | Status: AC | PRN
  Administered 2013-01-14: 70 mL via INTRAVENOUS

## 2013-01-14 MED ORDER — MIDAZOLAM HCL 2 MG/2ML IJ SOLN
INTRAMUSCULAR | Status: AC
  Filled 2013-01-14: qty 4

## 2013-01-14 MED ORDER — FENTANYL CITRATE 0.05 MG/ML IJ SOLN
INTRAMUSCULAR | Status: AC | PRN
  Administered 2013-01-14 (×2): 25 ug via INTRAVENOUS

## 2013-01-14 MED ORDER — MIDAZOLAM HCL 2 MG/2ML IJ SOLN
INTRAMUSCULAR | Status: AC | PRN
  Administered 2013-01-14 (×2): 1 mg via INTRAVENOUS

## 2013-01-14 MED ORDER — SODIUM CHLORIDE 0.9 % IV SOLN: INTRAVENOUS | Status: AC

## 2013-01-14 MED ORDER — FENTANYL CITRATE 0.05 MG/ML IJ SOLN
INTRAMUSCULAR | Status: AC
  Filled 2013-01-14: qty 4

## 2013-01-14 NOTE — H&P (Signed)
Chief Complaint: "I'm here for an angiogram" Referring Physician:Pool HPI: Anita Rhodes is an 37 y.o. 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 She has had f/u MRA with good stability. She remains on her ASA and Plavix and has no recurrent sxs with the exception of an occasional headache. She was seen for follow up a few weeks ago and is now scheduled for her follow up arteriogram. She otherwise feels ok, no fevers, chills, illnesses. PMHx and meds reviewed.  Past Medical History:  Past Medical History  Diagnosis Date  . Asthma     Past Surgical History:  Past Surgical History  Procedure Laterality Date  . Arthroscopic knee    . Other surgical history      aneurysm repair.    Family History:  Family History  Problem Relation Age of Onset  . Hypertension Mother   . Hyperlipidemia Father   . Hypertension Other     Social History:  reports that she has never smoked. She does not have any smokeless tobacco history on file. She reports that  drinks alcohol. She reports that she does not use illicit drugs.  Allergies:  Allergies  Allergen Reactions  . Banana Itching and Rash  . Fish-Derived Products Itching and Rash    Medications:   Medication List    ASK your doctor about these medications       albuterol 108 (90 BASE) MCG/ACT inhaler  Commonly known as:  PROVENTIL HFA;VENTOLIN HFA  Inhale 2 puffs into the lungs every 6 (six) hours as needed for wheezing or shortness of breath.     aspirin 325 MG tablet  Take 1 tablet (325 mg total) by mouth daily with breakfast.     clopidogrel 75 MG tablet  Commonly known as:  PLAVIX  Take 1 tablet (75 mg total) by mouth daily with breakfast.     diazepam 5 MG tablet  Commonly known as:  VALIUM  Take 1-2 tablets (5-10 mg total) by mouth every 6 (six) hours as needed (spasm).     oxyCODONE-acetaminophen 5-325 MG per tablet   Commonly known as:  PERCOCET/ROXICET  Take 1-2 tablets by mouth every 4 (four) hours as needed.        Please HPI for pertinent positives, otherwise complete 10 system ROS negative.  Physical Exam: BP 143/93  Pulse 97  Temp(Src) 98.5 F (36.9 C) (Oral)  Resp 20  Ht 5\' 4"  (1.626 m)  Wt 180 lb (81.647 kg)  BMI 30.88 kg/m2  SpO2 100%  LMP 12/02/2012 Body mass index is 30.88 kg/(m^2).   General Appearance:  Alert, cooperative, no distress, appears stated age  Head:  Normocephalic, without obvious abnormality, atraumatic  ENT: Unremarkable  Neck: Supple, symmetrical, trachea midline  Lungs:   Clear to auscultation bilaterally, no w/r/r.  Chest Wall:  No tenderness or deformity  Heart:  Regular rate and rhythm, S1, S2 normal, no murmur, rub or gallop.  Abdomen:   Soft, non-tender, non distended.  Extremities: Extremities normal, atraumatic, no cyanosis or edema  Pulses: 2+ and symmetric  Neurologic: Normal affect, no gross deficits.   Labs: Pending    Assessment/Plan Hx of ruptured left distal ICA aneurysm s/p Pipeline flow diverting stent. Discussed cerebral arteriogram today. Explained procedure, risks, complications, use of sedation. Labs pending. Consent signed in chart  Brayton El PA-C 01/14/2013, 9:03 AM

## 2013-01-14 NOTE — Procedures (Signed)
S/P 4 vewssel cerebral arteriogram  Rt CFA approach.. Findings. 1Obliterated LT ICA blood blister aneurysm. With mild intra pipeline stenosis due to neointimal hyperplasia.

## 2013-01-16 ENCOUNTER — Telehealth (HOSPITAL_COMMUNITY): Payer: Self-pay | Admitting: Interventional Radiology

## 2013-01-16 NOTE — Telephone Encounter (Signed)
Called pt left VM for her to come to [facility] to have P2Y12 study performed on 02/04/13 at her convenience. Asked her to call me back to verify that she received this info and that she could be here on that day for this blood test. JMichaux

## 2013-02-04 LAB — PLATELET INHIBITION P2Y12: Platelet Function  P2Y12: 6 [PRU] — ABNORMAL LOW (ref 194–418)

## 2013-02-06 ENCOUNTER — Telehealth (HOSPITAL_COMMUNITY): Payer: Self-pay | Admitting: Interventional Radiology

## 2013-02-06 NOTE — Telephone Encounter (Signed)
Called pt left VM for her to call me JM 

## 2013-05-09 ENCOUNTER — Ambulatory Visit: Payer: Self-pay | Admitting: Medical

## 2013-05-12 ENCOUNTER — Encounter: Payer: Self-pay | Admitting: Medical

## 2013-05-12 ENCOUNTER — Ambulatory Visit (INDEPENDENT_AMBULATORY_CARE_PROVIDER_SITE_OTHER): Payer: 59 | Admitting: Medical

## 2013-05-12 VITALS — BP 132/90 | HR 72 | Temp 98.0°F | Resp 18 | Wt 192.0 lb

## 2013-05-12 DIAGNOSIS — I10 Essential (primary) hypertension: Secondary | ICD-10-CM

## 2013-05-12 DIAGNOSIS — E669 Obesity, unspecified: Secondary | ICD-10-CM

## 2013-05-12 DIAGNOSIS — G4700 Insomnia, unspecified: Secondary | ICD-10-CM

## 2013-05-12 DIAGNOSIS — I671 Cerebral aneurysm, nonruptured: Secondary | ICD-10-CM

## 2013-05-12 NOTE — Progress Notes (Signed)
  Subjective:  Anita Rhodes is a 38 y.o. female who presents as a new patient today.  She has a history of brain aneurysm diagnosed this past year after having headache and problems with her neck.  Ended up having embolization and stent. She has had followup with neurosurgery, and has another followup soon. Sees Dr. Dutch QuintPoole  Her main 2 issues are blood pressure and sleep. She checks her blood pressures on occasion, and lately they've been all elevated. Neurosurgery does not have her currently only blood pressure medication. She did recently start exercising and trying to change her diet. Currently she is only getting headaches a few times per week and they are not really bad.  She does drink a lot of coffee, several glasses a day. No other aggravating or relieving factors.    Has trouble getting and staying a sleep.  Has tried benadryl with no success.  No other c/o.  The following portions of the patient's history were reviewed and updated as appropriate: allergies, current medications, past family history, past medical history, past social history, past surgical history and problem list.  ROS Otherwise as in subjective above  Objective: Physical Exam  BP 132/90  Pulse 72  Temp(Src) 98 F (36.7 C) (Oral)  Resp 18  Wt 192 lb (87.091 kg)   General appearence: alert, no distress, WD/WN, AA female, obese HEENT: normocephalic, sclerae anicteric, conjunctiva pink and moist, TMs pearly, nares patent, no discharge or erythema, pharynx normal Oral cavity: MMM, no lesions Neck: supple, no lymphadenopathy, no thyromegaly, no masses, no bruits Heart: RRR, normal S1, S2, no murmurs Lungs: CTA bilaterally, no wheezes, rhonchi, or rales Pulses: 2+ radial pulses, 2+ pedal pulses, normal cap refill Ext: no edema Neuro: CN2-12 intact, nonfocal  Assessment: Encounter Diagnoses  Name Primary?  . Essential hypertension, benign Yes  .  Brain aneurysm   . Obesity, unspecified   . Insomnia      Plan: HTN - discussed salt reduction, weight loss, diet, exericxe, water intake.   Work on improving BP with lifestyle changes unless neurosurgery starts her on medication.  F/u 58mo, sooner prn. Brain aneurysm - f/u with neurosurgery as planned obesity - lifestyle changes and weight loss advised. Insomnia - begin trial of Melatonin OTC, advised sleep hygiene measures.  Follow up: 58mo, sooner prn

## 2013-05-12 NOTE — Patient Instructions (Signed)
For sleep  Begin OTC melatonin supplement at bedtime  Try sleepy time OTC tea (decaf)  Try warm bath at night  Blood Pressure  Check pressures and log this in a notebook for us to see  Avoid salt, limit salt to 2g or less daily  Drink plenty of water daily  Use the DASH diet  increase exercise  Unless neurosurgery wants to start you on medication, lets try these lifestyle changes and see you back in 3-4 months.   DASH Diet The DASH diet stands for "Dietary Approaches to Stop Hypertension." It is a healthy eating plan that has been shown to reduce high blood pressure (hypertension) in as little as 14 days, while also possibly providing other significant health benefits. These other health benefits include reducing the risk of breast cancer after menopause and reducing the risk of type 2 diabetes, heart disease, colon cancer, and stroke. Health benefits also include weight loss and slowing kidney failure in patients with chronic kidney disease.  DIET GUIDELINES  Limit salt (sodium). Your diet should contain less than 1500 mg of sodium daily.  Limit refined or processed carbohydrates. Your diet should include mostly whole grains. Desserts and added sugars should be used sparingly.  Include small amounts of heart-healthy fats. These types of fats include nuts, oils, and tub margarine. Limit saturated and trans fats. These fats have been shown to be harmful in the body. CHOOSING FOODS  The following food groups are based on a 2000 calorie diet. See your Registered Dietitian for individual calorie needs. Grains and Grain Products (6 to 8 servings daily)  Eat More Often: Whole-wheat bread, brown rice, whole-grain or wheat pasta, quinoa, popcorn without added fat or salt (air popped).  Eat Less Often: White bread, white pasta, white rice, cornbread. Vegetables (4 to 5 servings daily)  Eat More Often: Fresh, frozen, and canned vegetables. Vegetables may be raw, steamed, roasted, or  grilled with a minimal amount of fat.  Eat Less Often/Avoid: Creamed or fried vegetables. Vegetables in a cheese sauce. Fruit (4 to 5 servings daily)  Eat More Often: All fresh, canned (in natural juice), or frozen fruits. Dried fruits without added sugar. One hundred percent fruit juice ( cup [237 mL] daily).  Eat Less Often: Dried fruits with added sugar. Canned fruit in light or heavy syrup. Foot LockerLean Meats, Fish, and Poultry (2 servings or less daily. One serving is 3 to 4 oz [85-114 g]).  Eat More Often: Ninety percent or leaner ground beef, tenderloin, sirloin. Round cuts of beef, chicken breast, Mal[place]key breast. All fish. Grill, bake, or broil your meat. Nothing should be fried.  Eat Less Often/Avoid: Fatty cuts of meat, [place]key, or chicken leg, thigh, or wing. Fried cuts of meat or fish. Dairy (2 to 3 servings)  Eat More Often: Low-fat or fat-free milk, low-fat plain or light yogurt, reduced-fat or part-skim cheese.  Eat Less Often/Avoid: Milk (whole, 2%).Whole milk yogurt. Full-fat cheeses. Nuts, Seeds, and Legumes (4 to 5 servings per week)  Eat More Often: All without added salt.  Eat Less Often/Avoid: Salted nuts and seeds, canned beans with added salt. Fats and Sweets (limited)  Eat More Often: Vegetable oils, tub margarines without trans fats, sugar-free gelatin. Mayonnaise and salad dressings.  Eat Less Often/Avoid: Coconut oils, palm oils, butter, stick margarine, cream, half and half, cookies, candy, pie. FOR MORE INFORMATION The Dash Diet Eating Plan: www.dashdiet.org Document Released: 04/13/2011 Document Revised: 07/17/2011 Document Reviewed: 04/13/2011 [name] CenterExit[facility] Patient Information 2014 [place], [place].

## 2013-06-04 ENCOUNTER — Telehealth (HOSPITAL_COMMUNITY): Payer: Self-pay | Admitting: Interventional Radiology

## 2013-06-04 ENCOUNTER — Encounter (HOSPITAL_COMMUNITY): Payer: Self-pay | Admitting: Pharmacy Technician

## 2013-06-04 ENCOUNTER — Other Ambulatory Visit (HOSPITAL_COMMUNITY): Payer: Self-pay | Admitting: Interventional Radiology

## 2013-06-04 DIAGNOSIS — I609 Nontraumatic subarachnoid hemorrhage, unspecified: Secondary | ICD-10-CM

## 2013-06-05 ENCOUNTER — Other Ambulatory Visit: Payer: Self-pay | Admitting: Radiology

## 2013-06-12 ENCOUNTER — Encounter (HOSPITAL_COMMUNITY): Payer: Self-pay

## 2013-06-12 ENCOUNTER — Ambulatory Visit (HOSPITAL_COMMUNITY)
Admission: RE | Admit: 2013-06-12 | Discharge: 2013-06-12 | Disposition: A | Discharge status: Home / Self Care | Payer: 59 | Source: Ambulatory Visit | Attending: Interventional Radiology | Admitting: Interventional Radiology

## 2013-06-12 ENCOUNTER — Other Ambulatory Visit (HOSPITAL_COMMUNITY): Payer: Self-pay | Admitting: Interventional Radiology

## 2013-06-12 VITALS — BP 140/96 | HR 111 | Temp 98.4°F | Resp 16 | Ht 64.0 in | Wt 193.0 lb

## 2013-06-12 DIAGNOSIS — I609 Nontraumatic subarachnoid hemorrhage, unspecified: Secondary | ICD-10-CM

## 2013-06-12 DIAGNOSIS — Z9889 Other specified postprocedural states: Secondary | ICD-10-CM | POA: Insufficient documentation

## 2013-06-12 DIAGNOSIS — Z09 Encounter for follow-up examination after completed treatment for conditions other than malignant neoplasm: Secondary | ICD-10-CM | POA: Insufficient documentation

## 2013-06-12 DIAGNOSIS — J45909 Unspecified asthma, uncomplicated: Secondary | ICD-10-CM | POA: Insufficient documentation

## 2013-06-12 LAB — APTT: aPTT: 25 seconds (ref 24–37)

## 2013-06-12 LAB — PROTIME-INR
INR: 1.03 (ref 0.00–1.49)
PROTHROMBIN TIME: 13.3 s (ref 11.6–15.2)

## 2013-06-12 LAB — BASIC METABOLIC PANEL
BUN: 14 mg/dL (ref 6–23)
CO2: 22 mEq/L (ref 19–32)
Calcium: 9.6 mg/dL (ref 8.4–10.5)
Chloride: 104 mEq/L (ref 96–112)
Creatinine, Ser: 0.8 mg/dL (ref 0.50–1.10)
Glucose, Bld: 92 mg/dL (ref 70–99)
POTASSIUM: 4.3 meq/L (ref 3.7–5.3)
SODIUM: 140 meq/L (ref 137–147)

## 2013-06-12 LAB — CBC
HEMATOCRIT: 34.9 % — AB (ref 36.0–46.0)
HEMOGLOBIN: 11.4 g/dL — AB (ref 12.0–15.0)
MCH: 26.8 pg (ref 26.0–34.0)
MCHC: 32.7 g/dL (ref 30.0–36.0)
MCV: 81.9 fL (ref 78.0–100.0)
Platelets: 275 10*3/uL (ref 150–400)
RBC: 4.26 MIL/uL (ref 3.87–5.11)
RDW: 14.4 % (ref 11.5–15.5)
WBC: 6.7 10*3/uL (ref 4.0–10.5)

## 2013-06-12 MED ORDER — IOHEXOL 300 MG/ML  SOLN
150.0000 mL | Freq: Once | INTRAMUSCULAR | Status: AC | PRN
  Administered 2013-06-12: 70 mL via INTRA_ARTERIAL

## 2013-06-12 MED ORDER — HYDRALAZINE HCL 20 MG/ML IJ SOLN
INTRAMUSCULAR | Status: AC
  Filled 2013-06-12: qty 1

## 2013-06-12 MED ORDER — SODIUM CHLORIDE 0.9 % IV SOLN
INTRAVENOUS | Status: AC
Start: 2013-06-12 — End: 2013-06-12

## 2013-06-12 MED ORDER — FENTANYL CITRATE 0.05 MG/ML IJ SOLN
INTRAMUSCULAR | Status: AC
  Filled 2013-06-12: qty 2

## 2013-06-12 MED ORDER — MIDAZOLAM HCL 2 MG/2ML IJ SOLN
INTRAMUSCULAR | Status: AC | PRN
  Administered 2013-06-12: 0.5 mg via INTRAVENOUS
  Administered 2013-06-12: 1 mg via INTRAVENOUS

## 2013-06-12 MED ORDER — HEPARIN SOD (PORK) LOCK FLUSH 100 UNIT/ML IV SOLN
INTRAVENOUS | Status: AC | PRN
  Administered 2013-06-12: 500 [IU] via INTRAVENOUS

## 2013-06-12 MED ORDER — FENTANYL CITRATE 0.05 MG/ML IJ SOLN
INTRAMUSCULAR | Status: AC | PRN
  Administered 2013-06-12: 25 ug via INTRAVENOUS
  Administered 2013-06-12: 12.5 ug via INTRAVENOUS

## 2013-06-12 MED ORDER — MIDAZOLAM HCL 2 MG/2ML IJ SOLN
INTRAMUSCULAR | Status: DC
Start: 2013-06-12 — End: 2013-06-13
  Filled 2013-06-12: qty 2

## 2013-06-12 MED ORDER — SODIUM CHLORIDE 0.9 % IV SOLN: Freq: Once | INTRAVENOUS | Status: DC

## 2013-06-12 NOTE — H&P (Signed)
Anita Rhodes is an 38 y.o. female.   Chief Complaint: Pt with hx L Internal Carotid Artery Aneurysm pipeline stent/flow diverter placed 10/2012. Has done well. No complaints; no symptoms Complained of headache 01/2013- cerebral arteriogram stable then. Plavix stopped 01/2013 per Dr Anita Rhodes; continues ASA 325 mg daily Scheduled now for re check cerebral arteriogram  HPI: Asthma; headaches; L ICA aneurysm flow diversion stent 10/2012  Past Medical History  Diagnosis Date  . Asthma   . Frequent headaches   . Brain aneurysm   . Family history of brain aneurysm   . Wears glasses     Past Surgical History  Procedure Laterality Date  . Arthroscopic knee    . Other surgical history      aneurysm repair.    Family History  Problem Relation Age of Onset  . Hypertension Mother   . Hyperlipidemia Father   . Aneurysm Father   . Stroke Father   . Hypertension Other   . Hypertension Paternal Grandfather    Social History:  reports that she has never smoked. She does not have any smokeless tobacco history on file. She reports that she drinks alcohol. She reports that she does not use illicit drugs.  Allergies:  Allergies  Allergen Reactions  . Banana Itching and Rash  . Fish-Derived Products Itching and Rash     (Not in a hospital admission)  Results for orders placed during the hospital encounter of 06/12/13 (from the past 48 hour(s))  APTT     Status: None   Collection Time    06/12/13 10:58 AM      Result Value Range   aPTT 25  24 - 37 seconds  CBC     Status: Abnormal   Collection Time    06/12/13 10:58 AM      Result Value Range   WBC 6.7  4.0 - 10.5 K/uL   RBC 4.26  3.87 - 5.11 MIL/uL   Hemoglobin 11.4 (*) 12.0 - 15.0 g/dL   HCT 16.134.9 (*) 09.636.0 - 04.546.0 %   MCV 81.9  78.0 - 100.0 fL   MCH 26.8  26.0 - 34.0 pg   MCHC 32.7  30.0 - 36.0 g/dL   RDW 40.914.4  81.111.5 - 91.415.5 %   Platelets 275  150 - 400 K/uL  PROTIME-INR     Status: None   Collection Time    06/12/13 10:58 AM       Result Value Range   Prothrombin Time 13.3  11.6 - 15.2 seconds   INR 1.03  0.00 - 1.49   No results found.  Review of Systems  Constitutional: Negative for fever and weight loss.  Eyes: Negative for blurred vision and double vision.  Respiratory: Negative for cough and shortness of breath.   Gastrointestinal: Negative for nausea, vomiting and abdominal pain.  Neurological: Negative for dizziness, weakness and headaches.  Psychiatric/Behavioral: Negative for substance abuse.    Blood pressure 141/118, pulse 85, temperature 98.7 F (37.1 C), temperature source Oral, resp. rate 20, height 5\' 4"  (1.626 m), weight 193 lb (87.544 kg), SpO2 100.00%. Physical Exam  Constitutional: She is oriented to person, place, and time. She appears well-nourished.  Cardiovascular: Normal rate, regular rhythm and normal heart sounds.   No murmur heard. Respiratory: Effort normal and breath sounds normal. She has no wheezes.  GI: Soft. Bowel sounds are normal. There is no tenderness.  Musculoskeletal: Normal range of motion.  Neurological: She is alert and oriented to person, place, and  time. Coordination normal.  Skin: Skin is warm and dry.  Psychiatric: She has a normal mood and affect. Her behavior is normal. Judgment and thought content normal.     Assessment/Plan Hx L ICA aneurysm pipeline stent placed 10/2012 Recheck 01/2013- stable Plavix stopped 01/2013; cont asa 325 daily Now 3 mo follow up cerebral arteriogram Pt aware of procedure benefits and risks and agreeable to proceed Consent signed and in chart  Anita Rhodes A 06/12/2013, 12:18 PM

## 2013-06-12 NOTE — ED Notes (Signed)
Patient denies pain.

## 2013-06-12 NOTE — Procedures (Signed)
S/P 4 vessel cerebral arteriogram  RT CFa approach  Findings. 1.Omliterated Li ICA ruptured blister aneurysm.

## 2013-06-12 NOTE — Discharge Instructions (Signed)
Angiography, Care After °Refer to this sheet in the next few weeks. These instructions provide you with information on caring for yourself after your procedure. Your health care provider may also give you more specific instructions. Your treatment has been planned according to current medical practices, but problems sometimes occur. Call your health care provider if you have any problems or questions after your procedure.  °WHAT TO EXPECT AFTER THE PROCEDURE °After your procedure, it is typical to have the following sensations: °· Minor discomfort or tenderness and a small bump at the catheter insertion site. The bump should usually decrease in size and tenderness within 1 to 2 weeks. °· Any bruising will usually fade within 2 to 4 weeks. °HOME CARE INSTRUCTIONS  °· You may need to keep taking blood thinners if they were prescribed for you. Only take over-the-counter or prescription medicines for pain, fever, or discomfort as directed by your health care provider. °· Do not apply powder or lotion to the site. °· Do not sit in a bathtub, swimming pool, or whirlpool for 5 to 7 days. °· You may shower 24 hours after the procedure. Remove the bandage (dressing) and gently wash the site with plain soap and water. Gently pat the site dry. °· Inspect the site at least twice daily. °· Limit your activity for the first 48 hours. Do not bend, squat, or lift anything over 20 lb (9 kg) or as directed by your health care provider. °· Do not drive home if you are discharged the day of the procedure. Have someone else drive you. Follow instructions about when you can drive or return to work. °SEEK MEDICAL CARE IF: °· You get lightheaded when standing up. °· You have drainage (other than a small amount of blood on the dressing). °· You have chills. °· You have a fever. °· You have redness, warmth, swelling, or pain at the insertion site. °SEEK IMMEDIATE MEDICAL CARE IF:  °· You develop chest pain or shortness of breath, feel faint,  or pass out. °· You have bleeding, swelling larger than a walnut, or drainage from the catheter insertion site. °· You develop pain, discoloration, coldness, or severe bruising in the leg or arm that held the catheter. °· You develop bleeding from any other place, such as the bowels. You may see bright red blood in your urine or stools, or your stools may appear black and tarry. °· You have heavy bleeding from the site. If this happens, hold pressure on the site. °MAKE SURE YOU: °· Understand these instructions. °· Will watch your condition. °· Will get help right away if you are not doing well or get worse. °Document Released: 11/10/2004 Document Revised: 12/25/2012 Document Reviewed: 09/16/2012 °Exit[employer]® Patient Information ©2014 [employer]. ° °

## 2013-09-30 ENCOUNTER — Other Ambulatory Visit: Payer: Self-pay | Admitting: Neurosurgery

## 2013-09-30 DIAGNOSIS — I609 Nontraumatic subarachnoid hemorrhage, unspecified: Secondary | ICD-10-CM

## 2013-10-06 ENCOUNTER — Ambulatory Visit
Admission: RE | Admit: 2013-10-06 | Discharge: 2013-10-06 | Disposition: A | Discharge status: Home / Self Care | Payer: 59 | Source: Ambulatory Visit | Attending: Neurosurgery | Admitting: Neurosurgery

## 2013-10-06 DIAGNOSIS — I609 Nontraumatic subarachnoid hemorrhage, unspecified: Secondary | ICD-10-CM

## 2013-10-06 MED ORDER — GADOBENATE DIMEGLUMINE 529 MG/ML IV SOLN
18.0000 mL | Freq: Once | INTRAVENOUS | Status: AC | PRN
  Administered 2013-10-06: 18 mL via INTRAVENOUS

## 2014-04-22 ENCOUNTER — Emergency Department (HOSPITAL_COMMUNITY)
Admission: EM | Admit: 2014-04-22 | Discharge: 2014-04-22 | Disposition: A | Discharge status: Home / Self Care | Payer: 59 | Attending: Emergency Medicine | Admitting: Emergency Medicine

## 2014-04-22 ENCOUNTER — Encounter (HOSPITAL_COMMUNITY): Payer: Self-pay | Admitting: Emergency Medicine

## 2014-04-22 ENCOUNTER — Emergency Department (HOSPITAL_COMMUNITY): Payer: 59

## 2014-04-22 DIAGNOSIS — Z79899 Other long term (current) drug therapy: Secondary | ICD-10-CM | POA: Diagnosis not present

## 2014-04-22 DIAGNOSIS — R51 Headache: Secondary | ICD-10-CM | POA: Diagnosis present

## 2014-04-22 DIAGNOSIS — R519 Headache, unspecified: Secondary | ICD-10-CM

## 2014-04-22 DIAGNOSIS — J45909 Unspecified asthma, uncomplicated: Secondary | ICD-10-CM | POA: Insufficient documentation

## 2014-04-22 DIAGNOSIS — Z7982 Long term (current) use of aspirin: Secondary | ICD-10-CM | POA: Diagnosis not present

## 2014-04-22 LAB — BASIC METABOLIC PANEL
ANION GAP: 14 (ref 5–15)
BUN: 10 mg/dL (ref 6–23)
CO2: 21 mEq/L (ref 19–32)
Calcium: 10.1 mg/dL (ref 8.4–10.5)
Chloride: 99 mEq/L (ref 96–112)
Creatinine, Ser: 0.65 mg/dL (ref 0.50–1.10)
GFR calc Af Amer: >90 mL/min (ref >90)
GFR calc non Af Amer: >90 mL/min (ref >90)
Glucose, Bld: 81 mg/dL (ref 70–99)
POTASSIUM: 4 meq/L (ref 3.7–5.3)
Sodium: 134 mEq/L — ABNORMAL LOW (ref 137–147)

## 2014-04-22 LAB — CBC WITH DIFFERENTIAL/PLATELET
BASOS ABS: 0 10*3/uL (ref 0.0–0.1)
Basophils Relative: 0 % (ref 0–1)
Eosinophils Absolute: 0.2 10*3/uL (ref 0.0–0.7)
Eosinophils Relative: 2 % (ref 0–5)
HCT: 38.2 % (ref 36.0–46.0)
Hemoglobin: 12.6 g/dL (ref 12.0–15.0)
Lymphocytes Relative: 45 % (ref 12–46)
Lymphs Abs: 3.2 10*3/uL (ref 0.7–4.0)
MCH: 27.7 pg (ref 26.0–34.0)
MCHC: 33 g/dL (ref 30.0–36.0)
MCV: 84 fL (ref 78.0–100.0)
Monocytes Absolute: 0.6 10*3/uL (ref 0.1–1.0)
Monocytes Relative: 8 % (ref 3–12)
NEUTROS ABS: 3.2 10*3/uL (ref 1.7–7.7)
Neutrophils Relative %: 45 % (ref 43–77)
Platelets: 266 10*3/uL (ref 150–400)
RBC: 4.55 MIL/uL (ref 3.87–5.11)
RDW: 14.6 % (ref 11.5–15.5)
WBC: 7.2 10*3/uL (ref 4.0–10.5)

## 2014-04-22 MED ORDER — HYDROCODONE-ACETAMINOPHEN 5-325 MG PO TABS: 1.0000 | ORAL_TABLET | Freq: Four times a day (QID) | ORAL | Status: DC | PRN

## 2014-04-22 MED ORDER — SODIUM CHLORIDE 0.9 % IV BOLUS (SEPSIS)
1000.0000 mL | Freq: Once | INTRAVENOUS | Status: AC
  Administered 2014-04-22: 1000 mL via INTRAVENOUS

## 2014-04-22 MED ORDER — DIPHENHYDRAMINE HCL 50 MG/ML IJ SOLN
25.0000 mg | Freq: Once | INTRAMUSCULAR | Status: AC
  Administered 2014-04-22: 25 mg via INTRAVENOUS
  Filled 2014-04-22: qty 1

## 2014-04-22 MED ORDER — METOCLOPRAMIDE HCL 5 MG/ML IJ SOLN
10.0000 mg | Freq: Once | INTRAMUSCULAR | Status: AC
  Administered 2014-04-22: 10 mg via INTRAVENOUS
  Filled 2014-04-22: qty 2

## 2014-04-22 NOTE — Discharge Instructions (Signed)

## 2014-04-22 NOTE — ED Provider Notes (Signed)
38 year old female, history of prior brain aneurysm status post coiling procedure in the last year, presents with recurrent headache. On exam the patient has normal mental status, she has received pain medication and has significant improvement in her symptoms, normal heart and lung exam, speech is clear and goal-directed, states that she is comfortable going home with medications, she currently takes Fioricet, ibuprofen, will give a short amount of Vicodin for any severe pain, cautioned her against overuse.  CT unremarkable  Medical screening examination/treatment/procedure(s) were conducted as a shared visit with non-physician practitioner(s) and myself.  I personally evaluated the patient during the encounter.  Clinical Impression:   Final diagnoses:  Headache         Vida RollerBrian D Tajai Suder, MD 04/23/14 [phone]

## 2014-04-22 NOTE — ED Notes (Addendum)
Patient states history of brain aneurysm.   Patient states bad headache x 2 days that feels like it is behind her L eye.   Patient denies vision changes at this time.  Patient states similar symptoms a few months ago, but symptoms resolved. Patient denies weakness or other symptoms.   Her MD, stated to come back if problems.   Patient sees Deveshwar for problems.

## 2014-04-22 NOTE — ED Provider Notes (Signed)
CSN: [id number]     Arrival date & time 04/22/14  1052 History   First MD Initiated Contact with Patient 04/22/14 1259     Chief Complaint  Patient presents with  . Headache     (Consider location/radiation/quality/duration/timing/severity/associated sxs/prior Treatment) HPI Comments: Patient presents emergency department with chief complaint of headache. She states she has a history of brain aneurysm. She states that she has had a bad headache for the past 2 days. She states that she feels associated pressure behind her left eye. She states that these symptoms feel similar to those that she felt prior to learning about her brain aneurysm. She denies any fevers, chills, weakness, numbness, or tingling. She is followed by Dr. Corliss Skainseveshwar  The history is provided by the patient. No language interpreter was used.    Past Medical History  Diagnosis Date  . Asthma   . Frequent headaches   . Brain aneurysm   . Family history of brain aneurysm   . Wears glasses    Past Surgical History  Procedure Laterality Date  . Arthroscopic knee    . Other surgical history      aneurysm repair.   Family History  Problem Relation Age of Onset  . Hypertension Mother   . Hyperlipidemia Father   . Aneurysm Father   . Stroke Father   . Hypertension Other   . Hypertension Paternal Grandfather    History  Substance Use Topics  . Smoking status: Never Smoker   . Smokeless tobacco: Not on file  . Alcohol Use: Yes     Comment: social   OB History    No data available     Review of Systems  Constitutional: Negative for fever and chills.  Respiratory: Negative for shortness of breath.   Cardiovascular: Negative for chest pain.  Gastrointestinal: Negative for nausea, vomiting, diarrhea and constipation.  Genitourinary: Negative for dysuria.  Neurological: Positive for headaches.      Allergies  Banana and Fish-derived products  Home Medications   Prior to Admission medications    Medication Sig Start Date End Date Taking? Authorizing Provider  albuterol (PROVENTIL HFA;VENTOLIN HFA) 108 (90 BASE) MCG/ACT inhaler Inhale 2 puffs into the lungs every 6 (six) hours as needed for wheezing or shortness of breath.    Historical Provider, MD  aspirin 325 MG tablet Take 1 tablet (325 mg total) by mouth daily with breakfast. 11/02/12   Clydene FakeJames R Hirsch, MD  butalbital-acetaminophen-caffeine (FIORICET WITH CODEINE) [phone] MG per capsule Take 1 capsule by mouth daily as needed for headache.  04/02/13   Historical Provider, MD  Multiple Vitamin (MULTIVITAMIN WITH MINERALS) TABS tablet Take 1 tablet by mouth daily.    Historical Provider, MD   BP 136/106 mmHg  Pulse 97  Temp(Src) 97.9 F (36.6 C) (Oral)  Resp 20  Ht 5\' 4"  (1.626 m)  Wt 193 lb (87.544 kg)  BMI 33.11 kg/m2  SpO2 98%  LMP 04/08/2014 Physical Exam  Constitutional: She is oriented to person, place, and time. She appears well-developed and well-nourished.  HENT:  Head: Normocephalic and atraumatic.  Right Ear: External ear normal.  Left Ear: External ear normal.  Eyes: Conjunctivae and EOM are normal. Pupils are equal, round, and reactive to light.  Neck: Normal range of motion. Neck supple.  No pain with neck flexion, no meningismus  Cardiovascular: Normal rate, regular rhythm and normal heart sounds.  Exam reveals no gallop and no friction rub.   No murmur heard. Pulmonary/Chest: Effort normal and  breath sounds normal. No respiratory distress. She has no wheezes. She has no rales. She exhibits no tenderness.  Abdominal: Soft. She exhibits no distension and no mass. There is no tenderness. There is no rebound and no guarding.  Musculoskeletal: Normal range of motion. She exhibits no edema or tenderness.  Normal gait.  Neurological: She is alert and oriented to person, place, and time. She has normal reflexes.  CN 3-12 intact, normal finger to nose, no pronator drift, sensation and strength intact  bilaterally.  Skin: Skin is warm and dry.  Psychiatric: She has a normal mood and affect. Her behavior is normal. Judgment and thought content normal.  Nursing note and vitals reviewed.   ED Course  Procedures (including critical care time) Labs Review Results for orders placed or performed during the hospital encounter of 04/22/14  CBC with Differential  Result Value Ref Range   WBC 7.2 4.0 - 10.5 K/uL   RBC 4.55 3.87 - 5.11 MIL/uL   Hemoglobin 12.6 12.0 - 15.0 g/dL   HCT 40.938.2 81.136.0 - 91.446.0 %   MCV 84.0 78.0 - 100.0 fL   MCH 27.7 26.0 - 34.0 pg   MCHC 33.0 30.0 - 36.0 g/dL   RDW 78.214.6 95.611.5 - 21.315.5 %   Platelets 266 150 - 400 K/uL   Neutrophils Relative % 45 43 - 77 %   Neutro Abs 3.2 1.7 - 7.7 K/uL   Lymphocytes Relative 45 12 - 46 %   Lymphs Abs 3.2 0.7 - 4.0 K/uL   Monocytes Relative 8 3 - 12 %   Monocytes Absolute 0.6 0.1 - 1.0 K/uL   Eosinophils Relative 2 0 - 5 %   Eosinophils Absolute 0.2 0.0 - 0.7 K/uL   Basophils Relative 0 0 - 1 %   Basophils Absolute 0.0 0.0 - 0.1 K/uL  Basic metabolic panel  Result Value Ref Range   Sodium 134 (L) 137 - 147 mEq/L   Potassium 4.0 3.7 - 5.3 mEq/L   Chloride 99 96 - 112 mEq/L   CO2 21 19 - 32 mEq/L   Glucose, Bld 81 70 - 99 mg/dL   BUN 10 6 - 23 mg/dL   Creatinine, Ser 0.860.65 0.50 - 1.10 mg/dL   Calcium 57.810.1 8.4 - 46.910.5 mg/dL   GFR calc non Af Amer >90 >90 mL/min   GFR calc Af Amer >90 >90 mL/min   Anion gap 14 5 - 15   Ct Head Wo Contrast  04/22/2014   CLINICAL DATA:  History of aneurysm with intense headache for 2 days behind left eye.  EXAM: CT HEAD WITHOUT CONTRAST  TECHNIQUE: Contiguous axial images were obtained from the base of the skull through the vertex without intravenous contrast.  COMPARISON:  Brain MR 10/06/2013.  CT head 11/17/2012.  FINDINGS: No evidence of an acute infarct, acute hemorrhage, mass lesion, mass effect or hydrocephalus. Visualized portions of the paranasal sinuses and mastoid air cells are clear.   IMPRESSION: Negative.   Electronically Signed   By: Leanna BattlesMelinda  Blietz M.D.   On: 04/22/2014 14:52      EKG Interpretation None      MDM   Final diagnoses:  Headache    Pt HA treated and improved while in ED.  Presentation is like pts typical HA and non [facility], ICH, Meningitis, or temporal arteritis. Pt is afebrile with no focal neuro deficits, nuchal rigidity, or change in vision. Pt is to follow up with PCP to discuss prophylactic medication. Pt verbalizes  understanding and is agreeable with plan to dc.    Patient seen by and discussed with Dr. Hyacinth Meeker, who agrees with the plan.  DC to home.  Continue fioricet.  Vicodin for break through pain.   Roxy Horseman, PA-C 04/22/14 1520  Vida Roller, MD 04/23/14 [phone]

## 2014-05-21 ENCOUNTER — Other Ambulatory Visit (HOSPITAL_COMMUNITY): Payer: Self-pay | Admitting: Interventional Radiology

## 2014-05-21 DIAGNOSIS — I671 Cerebral aneurysm, nonruptured: Secondary | ICD-10-CM

## 2014-06-08 ENCOUNTER — Encounter (INDEPENDENT_AMBULATORY_CARE_PROVIDER_SITE_OTHER): Payer: Self-pay

## 2014-06-08 ENCOUNTER — Encounter (HOSPITAL_COMMUNITY): Payer: Self-pay

## 2014-06-08 ENCOUNTER — Ambulatory Visit (HOSPITAL_COMMUNITY): Payer: 59

## 2014-06-08 ENCOUNTER — Ambulatory Visit (HOSPITAL_COMMUNITY)
Admission: RE | Admit: 2014-06-08 | Discharge: 2014-06-08 | Disposition: A | Discharge status: Home / Self Care | Payer: 59 | Source: Ambulatory Visit | Attending: Interventional Radiology | Admitting: Interventional Radiology

## 2014-06-08 ENCOUNTER — Other Ambulatory Visit: Payer: Self-pay | Admitting: Radiology

## 2014-06-08 DIAGNOSIS — I671 Cerebral aneurysm, nonruptured: Secondary | ICD-10-CM

## 2014-06-08 DIAGNOSIS — Z95828 Presence of other vascular implants and grafts: Secondary | ICD-10-CM | POA: Diagnosis not present

## 2014-06-08 DIAGNOSIS — R51 Headache: Secondary | ICD-10-CM | POA: Diagnosis not present

## 2014-06-08 DIAGNOSIS — H538 Other visual disturbances: Secondary | ICD-10-CM | POA: Insufficient documentation

## 2014-06-08 LAB — CREATININE, SERUM
Creatinine, Ser: 0.82 mg/dL (ref 0.50–1.10)
GFR calc non Af Amer: 90 mL/min — ABNORMAL LOW (ref >90)

## 2014-06-08 MED ORDER — LORAZEPAM 0.5 MG PO TABS: 1.0000 mg | ORAL_TABLET | Freq: Once | ORAL | Status: DC

## 2014-06-08 MED ORDER — LORAZEPAM 2 MG/ML IJ SOLN
1.0000 mg | Freq: Once | INTRAMUSCULAR | Status: AC
  Administered 2014-06-08: 1 mg via INTRAVENOUS
  Filled 2014-06-08: qty 0.5

## 2014-06-08 MED ORDER — GADOBENATE DIMEGLUMINE 529 MG/ML IV SOLN
20.0000 mL | Freq: Once | INTRAVENOUS | Status: AC | PRN
Start: 2014-06-08 — End: 2014-06-08
  Administered 2014-06-08: 20 mL via INTRAVENOUS

## 2014-06-08 MED ORDER — LORAZEPAM 2 MG/ML IJ SOLN
INTRAMUSCULAR | Status: AC
  Administered 2014-06-08: 1 mg via INTRAVENOUS
  Filled 2014-06-08: qty 1

## 2014-06-09 ENCOUNTER — Telehealth (HOSPITAL_COMMUNITY): Payer: Self-pay | Admitting: Interventional Radiology

## 2014-06-09 NOTE — Telephone Encounter (Signed)
Called pt, told her that her next MRI/MRA would be due in 1 year's time but that if she had any new or worsening sx to call us back sooner. She states understanding and is in agreement. JM

## 2014-06-10 NOTE — Progress Notes (Signed)
Patent was dismissed 12/2013 per Melissa/billing.  Last ov 05/2013.  Please mention to Dr. Susann GivensLalonde

## 2015-05-18 ENCOUNTER — Telehealth (HOSPITAL_COMMUNITY): Payer: Self-pay

## 2015-05-18 NOTE — Telephone Encounter (Signed)
Called to schedule f/u MRI/MRA, left message for pt to return call. AW 

## 2015-05-28 ENCOUNTER — Other Ambulatory Visit (HOSPITAL_COMMUNITY): Payer: Self-pay | Admitting: Interventional Radiology

## 2015-05-28 DIAGNOSIS — I671 Cerebral aneurysm, nonruptured: Secondary | ICD-10-CM

## 2015-06-03 ENCOUNTER — Other Ambulatory Visit (HOSPITAL_COMMUNITY): Payer: Self-pay | Admitting: Interventional Radiology

## 2015-06-03 DIAGNOSIS — I729 Aneurysm of unspecified site: Secondary | ICD-10-CM

## 2015-06-10 ENCOUNTER — Ambulatory Visit (HOSPITAL_COMMUNITY)
Admission: RE | Admit: 2015-06-10 | Discharge: 2015-06-10 | Disposition: A | Discharge status: Home / Self Care | Payer: 59 | Source: Ambulatory Visit | Attending: Interventional Radiology | Admitting: Interventional Radiology

## 2015-06-10 DIAGNOSIS — I729 Aneurysm of unspecified site: Secondary | ICD-10-CM

## 2015-06-28 ENCOUNTER — Ambulatory Visit (HOSPITAL_COMMUNITY): Payer: 59

## 2015-06-28 ENCOUNTER — Ambulatory Visit (HOSPITAL_COMMUNITY)
Admission: RE | Admit: 2015-06-28 | Discharge: 2015-06-28 | Disposition: A | Discharge status: Home / Self Care | Payer: 59 | Source: Ambulatory Visit | Attending: Interventional Radiology | Admitting: Interventional Radiology

## 2015-06-28 DIAGNOSIS — I671 Cerebral aneurysm, nonruptured: Secondary | ICD-10-CM

## 2015-06-28 DIAGNOSIS — Z9889 Other specified postprocedural states: Secondary | ICD-10-CM | POA: Diagnosis not present

## 2015-06-28 LAB — CREATININE, SERUM
Creatinine, Ser: 0.77 mg/dL (ref 0.44–1.00)
GFR calc Af Amer: >60 mL/min (ref >60)
GFR calc non Af Amer: >60 mL/min (ref >60)

## 2015-06-28 MED ORDER — GADOBENATE DIMEGLUMINE 529 MG/ML IV SOLN
20.0000 mL | Freq: Once | INTRAVENOUS | Status: AC | PRN
  Administered 2015-06-28: 20 mL via INTRAVENOUS

## 2015-07-15 ENCOUNTER — Telehealth (HOSPITAL_COMMUNITY): Payer: Self-pay

## 2015-07-15 NOTE — Telephone Encounter (Signed)
Pt agreed to f/u in 1 yr with a MRA per Dr. Deveshwar. AW 

## 2015-07-15 NOTE — Telephone Encounter (Signed)
Called to have pt f/u in [id number] with a MRA per Dr. Corliss Skainseveshwar, left a VM for pt to return call. AW

## 2015-07-20 ENCOUNTER — Telehealth (HOSPITAL_COMMUNITY): Payer: Self-pay

## 2015-07-20 NOTE — Telephone Encounter (Signed)
Called pt to inform her that is was ok to stop aspirin prior to her dental procedure per Dr. Corliss Skainseveshwar. Pt agreed to stop prior and start back taking the aspirin following the procedure. AW

## 2016-06-09 ENCOUNTER — Telehealth (HOSPITAL_COMMUNITY): Payer: Self-pay

## 2016-06-09 NOTE — Telephone Encounter (Signed)
Called to schedule f/u mra, left message for pt to return call. AW 

## 2016-06-27 ENCOUNTER — Ambulatory Visit: Payer: 59 | Admitting: Diagnostic Neuroimaging

## 2016-06-28 ENCOUNTER — Encounter: Payer: Self-pay | Admitting: Diagnostic Neuroimaging

## 2016-07-25 ENCOUNTER — Telehealth (HOSPITAL_COMMUNITY): Payer: Self-pay

## 2016-07-25 ENCOUNTER — Other Ambulatory Visit (HOSPITAL_COMMUNITY): Payer: Self-pay | Admitting: Interventional Radiology

## 2016-07-25 DIAGNOSIS — I729 Aneurysm of unspecified site: Secondary | ICD-10-CM

## 2016-07-25 NOTE — Telephone Encounter (Signed)
Called to schedule f/u mra, left message for pt to return call. AW 

## 2016-08-11 ENCOUNTER — Ambulatory Visit (HOSPITAL_COMMUNITY)
Admission: RE | Admit: 2016-08-11 | Discharge: 2016-08-11 | Disposition: A | Discharge status: Home / Self Care | Payer: 59 | Source: Ambulatory Visit | Attending: Interventional Radiology | Admitting: Interventional Radiology

## 2016-08-11 DIAGNOSIS — I729 Aneurysm of unspecified site: Secondary | ICD-10-CM | POA: Diagnosis not present

## 2016-08-28 ENCOUNTER — Telehealth (HOSPITAL_COMMUNITY): Payer: Self-pay

## 2016-08-28 NOTE — Telephone Encounter (Signed)
Left message for pt to return call. AW 

## 2016-08-28 NOTE — Telephone Encounter (Signed)
Pt agreed to f/u in 1 yr with mri/mra. AW 

## 2017-03-23 ENCOUNTER — Other Ambulatory Visit: Payer: Self-pay | Admitting: Neurosurgery

## 2017-03-23 DIAGNOSIS — I607 Nontraumatic subarachnoid hemorrhage from unspecified intracranial artery: Secondary | ICD-10-CM

## 2017-04-05 ENCOUNTER — Ambulatory Visit
Admission: RE | Admit: 2017-04-05 | Discharge: 2017-04-05 | Disposition: A | Discharge status: Home / Self Care | Payer: 59 | Source: Ambulatory Visit | Attending: Neurosurgery | Admitting: Neurosurgery

## 2017-04-05 DIAGNOSIS — I607 Nontraumatic subarachnoid hemorrhage from unspecified intracranial artery: Secondary | ICD-10-CM

## 2017-04-05 MED ORDER — IOPAMIDOL (ISOVUE-370) INJECTION 76%
75.0000 mL | Freq: Once | INTRAVENOUS | Status: AC | PRN
  Administered 2017-04-05: 75 mL via INTRAVENOUS

## 2017-05-15 ENCOUNTER — Other Ambulatory Visit (HOSPITAL_COMMUNITY): Payer: Self-pay | Admitting: Interventional Radiology

## 2017-05-15 DIAGNOSIS — H538 Other visual disturbances: Secondary | ICD-10-CM

## 2017-05-15 DIAGNOSIS — I729 Aneurysm of unspecified site: Secondary | ICD-10-CM

## 2017-05-21 ENCOUNTER — Other Ambulatory Visit: Payer: Self-pay | Admitting: Radiology

## 2017-05-22 ENCOUNTER — Other Ambulatory Visit (HOSPITAL_COMMUNITY): Payer: Self-pay | Admitting: Interventional Radiology

## 2017-05-22 ENCOUNTER — Ambulatory Visit (HOSPITAL_COMMUNITY)
Admission: RE | Admit: 2017-05-22 | Discharge: 2017-05-22 | Disposition: A | Discharge status: Home / Self Care | Payer: 59 | Source: Ambulatory Visit | Attending: Interventional Radiology | Admitting: Interventional Radiology

## 2017-05-22 ENCOUNTER — Encounter (HOSPITAL_COMMUNITY): Payer: Self-pay

## 2017-05-22 DIAGNOSIS — I729 Aneurysm of unspecified site: Secondary | ICD-10-CM

## 2017-05-22 DIAGNOSIS — Z7982 Long term (current) use of aspirin: Secondary | ICD-10-CM | POA: Diagnosis not present

## 2017-05-22 DIAGNOSIS — I671 Cerebral aneurysm, nonruptured: Secondary | ICD-10-CM | POA: Insufficient documentation

## 2017-05-22 DIAGNOSIS — H538 Other visual disturbances: Secondary | ICD-10-CM

## 2017-05-22 DIAGNOSIS — Z79899 Other long term (current) drug therapy: Secondary | ICD-10-CM | POA: Insufficient documentation

## 2017-05-22 DIAGNOSIS — J45909 Unspecified asthma, uncomplicated: Secondary | ICD-10-CM | POA: Insufficient documentation

## 2017-05-22 DIAGNOSIS — E119 Type 2 diabetes mellitus without complications: Secondary | ICD-10-CM | POA: Insufficient documentation

## 2017-05-22 HISTORY — PX: IR 3D INDEPENDENT WKST: IMG2385

## 2017-05-22 HISTORY — PX: IR ANGIO VERTEBRAL SEL VERTEBRAL BILAT MOD SED: IMG5369

## 2017-05-22 HISTORY — PX: IR ANGIO INTRA EXTRACRAN SEL COM CAROTID INNOMINATE BILAT MOD SED: IMG5360

## 2017-05-22 LAB — CBC
HEMATOCRIT: 40.4 % (ref 36.0–46.0)
Hemoglobin: 13.3 g/dL (ref 12.0–15.0)
MCH: 29 pg (ref 26.0–34.0)
MCHC: 32.9 g/dL (ref 30.0–36.0)
MCV: 88 fL (ref 78.0–100.0)
Platelets: 289 10*3/uL (ref 150–400)
RBC: 4.59 MIL/uL (ref 3.87–5.11)
RDW: 14 % (ref 11.5–15.5)
WBC: 6.1 10*3/uL (ref 4.0–10.5)

## 2017-05-22 LAB — PREGNANCY, URINE: PREG TEST UR: NEGATIVE

## 2017-05-22 LAB — BASIC METABOLIC PANEL
ANION GAP: 12 (ref 5–15)
BUN: 16 mg/dL (ref 6–20)
CHLORIDE: 104 mmol/L (ref 101–111)
CO2: 21 mmol/L — ABNORMAL LOW (ref 22–32)
Calcium: 9.9 mg/dL (ref 8.9–10.3)
Creatinine, Ser: 0.89 mg/dL (ref 0.44–1.00)
GFR calc non Af Amer: >60 mL/min (ref >60)
Glucose, Bld: 105 mg/dL — ABNORMAL HIGH (ref 65–99)
POTASSIUM: 3.5 mmol/L (ref 3.5–5.1)
SODIUM: 137 mmol/L (ref 135–145)

## 2017-05-22 LAB — PROTIME-INR
INR: 0.93
Prothrombin Time: 12.4 seconds (ref 11.4–15.2)

## 2017-05-22 LAB — APTT: aPTT: 24 seconds (ref 24–36)

## 2017-05-22 MED ORDER — FENTANYL CITRATE (PF) 100 MCG/2ML IJ SOLN
INTRAMUSCULAR | Status: AC
  Filled 2017-05-22: qty 2

## 2017-05-22 MED ORDER — FENTANYL CITRATE (PF) 100 MCG/2ML IJ SOLN
INTRAMUSCULAR | Status: AC | PRN
  Administered 2017-05-22 (×2): 25 ug via INTRAVENOUS

## 2017-05-22 MED ORDER — HYDRALAZINE HCL 20 MG/ML IJ SOLN
INTRAMUSCULAR | Status: AC
  Filled 2017-05-22: qty 1

## 2017-05-22 MED ORDER — IOPAMIDOL (ISOVUE-300) INJECTION 61%
INTRAVENOUS | Status: AC
  Filled 2017-05-22: qty 150

## 2017-05-22 MED ORDER — SODIUM CHLORIDE 0.9 % IV SOLN
INTRAVENOUS | Status: DC
  Administered 2017-05-22: 12:00:00 via INTRAVENOUS

## 2017-05-22 MED ORDER — SODIUM CHLORIDE 0.9 % IV SOLN: Freq: Once | INTRAVENOUS | Status: DC

## 2017-05-22 MED ORDER — HEPARIN SODIUM (PORCINE) 1000 UNIT/ML IJ SOLN
INTRAMUSCULAR | Status: AC | PRN
  Administered 2017-05-22: 500 [IU] via INTRAVENOUS

## 2017-05-22 MED ORDER — LIDOCAINE HCL 1 % IJ SOLN
INTRAMUSCULAR | Status: AC
  Filled 2017-05-22: qty 20

## 2017-05-22 MED ORDER — LIDOCAINE HCL (PF) 1 % IJ SOLN
INTRAMUSCULAR | Status: AC | PRN
  Administered 2017-05-22: 10 mL
  Administered 2017-05-22: 30 mL

## 2017-05-22 MED ORDER — HEPARIN SODIUM (PORCINE) 1000 UNIT/ML IJ SOLN
INTRAMUSCULAR | Status: AC | PRN
  Administered 2017-05-22: 1000 [IU] via INTRAVENOUS
  Administered 2017-05-22 (×2): 500 [IU] via INTRAVENOUS

## 2017-05-22 MED ORDER — IOPAMIDOL (ISOVUE-300) INJECTION 61%
INTRAVENOUS | Status: AC
  Filled 2017-05-22: qty 100

## 2017-05-22 MED ORDER — HEPARIN SODIUM (PORCINE) 1000 UNIT/ML IJ SOLN
INTRAMUSCULAR | Status: AC
  Filled 2017-05-22: qty 1

## 2017-05-22 MED ORDER — MIDAZOLAM HCL 2 MG/2ML IJ SOLN
INTRAMUSCULAR | Status: AC
  Filled 2017-05-22: qty 2

## 2017-05-22 MED ORDER — MIDAZOLAM HCL 2 MG/2ML IJ SOLN
INTRAMUSCULAR | Status: AC | PRN
  Administered 2017-05-22: 1 mg via INTRAVENOUS

## 2017-05-22 MED ORDER — IOPAMIDOL (ISOVUE-300) INJECTION 61%
INTRAVENOUS | Status: AC
  Administered 2017-05-22: 110 mL
  Filled 2017-05-22: qty 50

## 2017-05-22 MED ORDER — FENTANYL CITRATE (PF) 100 MCG/2ML IJ SOLN
INTRAMUSCULAR | Status: AC | PRN
  Administered 2017-05-22: 25 ug via INTRAVENOUS

## 2017-05-22 MED ORDER — HYDRALAZINE HCL 20 MG/ML IJ SOLN
INTRAMUSCULAR | Status: AC | PRN
  Administered 2017-05-22 (×2): 5 mg via INTRAVENOUS

## 2017-05-22 NOTE — Sedation Documentation (Signed)
Patient is resting comfortably. 

## 2017-05-22 NOTE — Discharge Instructions (Signed)
Cerebral Angiogram, Care After °Refer to this sheet in the next few weeks. These instructions provide you with information on caring for yourself after your procedure. Your health care provider may also give you more specific instructions. Your treatment has been planned according to current medical practices, but problems sometimes occur. Call your health care provider if you have any problems or questions after your procedure. °What can I expect after the procedure? °After your procedure, it is typical to have the following: °· Bruising at the catheter insertion site that usually fades within 1-2 weeks. °· Blood collecting in the tissue (hematoma) that may be painful to the touch. It should usually decrease in size and tenderness within 1-2 weeks. °· A mild headache. ° °Follow these instructions at home: °· Take medicines only as directed by your health care provider. °· You may shower 24-48 hours after the procedure or as directed by your health care provider. Remove the bandage (dressing) and gently wash the site with plain soap and water. Pat the area dry with a clean towel. Do not rub the site, because this may cause bleeding. °· Do not take baths, swim, or use a hot tub until your health care provider approves. °· Check your insertion site every day for redness, swelling, or drainage. °· Do not apply powder or lotion to the site. °· Do not lift over 10 lb (4.5 kg) for 5 days after your procedure or as directed by your health care provider. °· Ask your health care provider when it is okay to: °? Return to work or school. °? Resume usual physical activities or sports. °? Resume sexual activity. °· Do not drive home if you are discharged the same day as the procedure. Have someone else drive you. °· You may drive 24 hours after the procedure unless otherwise instructed by your health care provider. °· Do not operate machinery or power tools for 24 hours after the procedure or as directed by your health care  provider. °· If your procedure was done as an outpatient procedure, which means that you went home the same day as your procedure, a responsible adult should be with you for the first 24 hours after you arrive home. °· Keep all follow-up visits as directed by your health care provider. This is important. °Contact a health care provider if: °· You have a fever. °· You have chills. °· You have increased bleeding from the catheter insertion site. Hold pressure on the site. °Get help right away if: °· You have vision changes or loss of vision. °· You have numbness or weakness on one side of your body. °· You have difficulty talking, or you have slurred speech or cannot speak (aphasia). °· You feel confused or have difficulty remembering. °· You have unusual pain at the catheter insertion site. °· You have redness, warmth, or swelling at the catheter insertion site. °· You have drainage (other than a small amount of blood on the dressing) from the catheter insertion site. °· The catheter insertion site is bleeding, and the bleeding does not stop after 30 minutes of holding steady pressure on the site. °These symptoms may represent a serious problem that is an emergency. Do not wait to see if the symptoms will go away. Get medical help right away. Call your local emergency services (911 in [place] Do not drive yourself to the hospital. °This information is not intended to replace advice given to you by your health care provider. Make sure you discuss any questions   you have with your health care provider. °Document Released: 09/08/2013 Document Revised: 09/30/2015 Document Reviewed: 05/07/2013 °Elsevier Interactive Patient Education © 2017 [employer] ° ° ° °Femoral Site Care °Refer to this sheet in the next few weeks. These instructions provide you with information about caring for yourself after your procedure. Your health care provider may also give you more specific instructions. Your treatment has been planned  according to current medical practices, but problems sometimes occur. Call your health care provider if you have any problems or questions after your procedure. °What can I expect after the procedure? °After your procedure, it is typical to have the following: °· Bruising at the site that usually fades within 1-2 weeks. °· Blood collecting in the tissue (hematoma) that may be painful to the touch. It should usually decrease in size and tenderness within 1-2 weeks. ° °Follow these instructions at home: °· Take medicines only as directed by your health care provider. °· You may shower 24-48 hours after the procedure or as directed by your health care provider. Remove the bandage (dressing) and gently wash the site with plain soap and water. Pat the area dry with a clean towel. Do not rub the site, because this may cause bleeding. °· Do not take baths, swim, or use a hot tub until your health care provider approves. °· Check your insertion site every day for redness, swelling, or drainage. °· Do not apply powder or lotion to the site. °· Limit use of stairs to twice a day for the first 2-3 days or as directed by your health care provider. °· Do not squat for the first 2-3 days or as directed by your health care provider. °· Do not lift over 10 lb (4.5 kg) for 5 days after your procedure or as directed by your health care provider. °· Ask your health care provider when it is okay to: °? Return to work or school. °? Resume usual physical activities or sports. °? Resume sexual activity. °· Do not drive home if you are discharged the same day as the procedure. Have someone else drive you. °· You may drive 24 hours after the procedure unless otherwise instructed by your health care provider. °· Do not operate machinery or power tools for 24 hours after the procedure or as directed by your health care provider. °· If your procedure was done as an outpatient procedure, which means that you went home the same day as your  procedure, a responsible adult should be with you for the first 24 hours after you arrive home. °· Keep all follow-up visits as directed by your health care provider. This is important. °Contact a health care provider if: °· You have a fever. °· You have chills. °· You have increased bleeding from the site. Hold pressure on the site. °Get help right away if: °· You have unusual pain at the site. °· You have redness, warmth, or swelling at the site. °· You have drainage (other than a small amount of blood on the dressing) from the site. °· The site is bleeding, and the bleeding does not stop after 30 minutes of holding steady pressure on the site. °· Your leg or foot becomes pale, cool, tingly, or numb. °This information is not intended to replace advice given to you by your health care provider. Make sure you discuss any questions you have with your health care provider. °Document Released: 12/26/2013 Document Revised: 09/30/2015 Document Reviewed: 11/11/2013 °Elsevier Interactive Patient Education © 2018 [employer] ° ° °

## 2017-05-22 NOTE — Procedures (Signed)
S/p $ vessel cerebral arteriogram. RT CFA approach. Findings. 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 Widely patent pipeline flow diverter Lt ICA supraclinoid region 2.Approx 1.388mm x 1.2 mm blister aneurysm from post medial wall Iof RT ICA between origins of ophthalmic artery and RT PCOM infundibulum.

## 2017-05-22 NOTE — Sedation Documentation (Signed)
MD placing closure device to R groin

## 2017-05-22 NOTE — Sedation Documentation (Signed)
Pressure hold to R groin complete 1020. Bedrest begins

## 2017-05-22 NOTE — H&P (Signed)
Chief Complaint: Patient was seen in consultation today for aneurysm  Supervising Physician: Julieanne Cotton  Patient Status: [facility] - Out-pt  History of Present Illness: Anita Rhodes is a 42 y.o. female known to radiology department from previous cerebral angiograms and interventions recently underwent surveillance Head CT which showed development of blister aneurysm.  Head CTA 04/06/17 showed: 1. Patent LEFT internal carotid artery pipeline stent. 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 2. 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 3. Otherwise negative CTA HEAD.  Patient now presents to radiology department for diagnostic cerebral aneurysm.  Patient does endorse new nausea over the past 2-3 months.  She denies new headaches, blurry vision, trouble walking, dizziness, or other neurological changes.   She has been NPO.  She does take aspirin 325 daily.   Past Medical History:  Diagnosis Date  . Asthma   . Brain aneurysm   . Diabetes (HCC)   . Family history of brain aneurysm   . Frequent headaches   . Wears glasses     Past Surgical History:  Procedure Laterality Date  . arthroscopic knee    . OTHER SURGICAL HISTORY     aneurysm repair.    Allergies: Banana and Fish-derived products  Medications: Prior to Admission medications   Medication Sig Start Date End Date Taking? Authorizing Provider  aspirin 325 MG tablet Take 1 tablet (325 mg total) by mouth daily with breakfast. 11/02/12  Yes Colon Branch, MD  butalbital-acetaminophen-caffeine (FIORICET WITH CODEINE) [phone] MG per capsule Take 1 capsule by mouth daily as needed for headache.  04/02/13  Yes [provider]  HYDROcodone-acetaminophen (NORCO/VICODIN) 5-325 MG per tablet Take 1-2 tablets by mouth every 6 (six) hours as needed. 04/22/14  Yes Roxy Horseman, PA-C  Multiple Vitamin (MULTIVITAMIN WITH MINERALS) TABS tablet Take 1 tablet by  mouth daily.   Yes [provider]  albuterol (PROVENTIL HFA;VENTOLIN HFA) 108 (90 BASE) MCG/ACT inhaler Inhale 2 puffs into the lungs every 6 (six) hours as needed for wheezing or shortness of breath.    [provider]  aspirin-acetaminophen-caffeine (EXCEDRIN MIGRAINE) [phone] MG per tablet Take 2 tablets by mouth every 6 (six) hours as needed for headache.    [provider]     Family History  Problem Relation Age of Onset  . Hyperlipidemia Father   . Aneurysm Father   . Stroke Father   . Hypertension Mother   . Hypertension Other   . Hypertension Paternal Grandfather     Social History   Socioeconomic History  . Marital status: Single    Spouse name: None  . Number of children: None  . Years of education: None  . Highest education level: None  Social Needs  . Financial resource strain: None  . Food insecurity - worry: None  . Food insecurity - inability: None  . Transportation needs - medical: None  . Transportation needs - non-medical: None  Occupational History  . None  Tobacco Use  . Smoking status: Never Smoker  . Smokeless tobacco: Never Used  Substance and Sexual Activity  . Alcohol use: Yes    Comment: social  . Drug use: No  . Sexual activity: None  Other Topics Concern  . None  Social History Narrative  . None    Review of Systems  Constitutional: Negative for fatigue and fever.  Respiratory: Negative for cough and shortness of breath.   Cardiovascular: Negative for chest pain.  Gastrointestinal: Positive for nausea (2-3 months,  episodic).  Neurological: Negative for dizziness, weakness, light-headedness and headaches.  Psychiatric/Behavioral: Negative for behavioral problems and confusion.    Vital Signs: BP (!) 145/109   Pulse 92   Temp 98.1 F (36.7 C) (Oral)   Resp 16   Ht 5\' 4"  (1.626 m)   Wt 186 lb (84.4 kg)   LMP 05/16/2017   SpO2 100%   BMI 31.93 kg/m   Physical Exam  Constitutional: She is  oriented to person, place, and time. She appears well-developed.  Cardiovascular: Normal rate, regular rhythm and normal heart sounds.  Pulmonary/Chest: Effort normal and breath sounds normal. No respiratory distress.  Abdominal: Soft. There is no tenderness.  Neurological: She is alert and oriented to person, place, and time.  Skin: Skin is warm and dry.  Psychiatric: She has a normal mood and affect. Her behavior is normal. Judgment and thought content normal.  Nursing note and vitals reviewed.   Imaging: No results found.  Labs:  CBC: Recent Labs    05/22/17 0647  WBC 6.1  HGB 13.3  HCT 40.4  PLT 289    COAGS: Recent Labs    05/22/17 0647  INR 0.93  APTT 24    BMP: No results for input(s): NA, K, CL, CO2, GLUCOSE, BUN, CALCIUM, CREATININE, GFRNONAA, GFRAA in the last 8760 hours.  Invalid input(s): CMP  LIVER FUNCTION TESTS: No results for input(s): BILITOT, AST, ALT, ALKPHOS, PROT, ALBUMIN in the last 8760 hours.  TUMOR MARKERS: No results for input(s): AFPTM, CEA, CA199, CHROMGRNA in the last 8760 hours.  Assessment and Plan: Patient with past medical history of ruptured aneurysm s/p stent placement presents with complaint of new blister aneurysm identified by Head CTA.  She presents today for diagnostic angiogram.  Patient presents today in their usual state of health.  She has been NPO and is not currently on blood thinners.  Risks and benefits were discussed with the patient including, but not limited to bleeding, infection, vascular injury or contrast induced renal failure.  This interventional procedure involves the use of X-rays and because of the nature of the planned procedure, it is possible that we will have prolonged use of X-ray fluoroscopy.  Potential radiation risks to you include (but are not limited to) the following: - A slightly elevated risk for cancer  several years later in life. This risk is typically less than 0.5% percent. This risk  is low in comparison to the normal incidence of human cancer, which is 33% for women and 50% for men according to the [employer] Society. - Radiation induced injury can include skin redness, resembling a rash, tissue breakdown / ulcers and hair loss (which can be temporary or permanent).   The likelihood of either of these occurring depends on the difficulty of the procedure and whether you are sensitive to radiation due to previous procedures, disease, or genetic conditions.   IF your procedure requires a prolonged use of radiation, you will be notified and given written instructions for further action.  It is your responsibility to monitor the irradiated area for the 2 weeks following the procedure and to notify your physician if you are concerned that you have suffered a radiation induced injury.    All of the patient's questions were answered, patient is agreeable to proceed.  Consent signed and in chart.  Thank you for this interesting consult.  I greatly enjoyed meeting Anita Rhodes and look forward to participating in their care.  A copy of this report was  sent to the requesting provider on this date.  Electronically Signed: Hoyt KochKacie Sue-Ellen Jamir Rone, PA 05/22/2017, 8:18 AM   I spent a total of    25 Minutes in face to face in clinical consultation, greater than 50% of which was counseling/coordinating care for aneurysm.

## 2017-05-22 NOTE — Sedation Documentation (Signed)
IR holding pressure to R groin 

## 2017-05-22 NOTE — Sedation Documentation (Signed)
Only 500 units Heparin just given- several duplicates in charting

## 2017-05-23 ENCOUNTER — Encounter (HOSPITAL_COMMUNITY): Payer: Self-pay | Admitting: Interventional Radiology

## 2017-05-24 ENCOUNTER — Other Ambulatory Visit (HOSPITAL_COMMUNITY): Payer: Self-pay | Admitting: Interventional Radiology

## 2017-05-24 ENCOUNTER — Encounter (HOSPITAL_COMMUNITY): Payer: Self-pay

## 2017-05-24 DIAGNOSIS — H538 Other visual disturbances: Secondary | ICD-10-CM

## 2017-05-24 DIAGNOSIS — I729 Aneurysm of unspecified site: Secondary | ICD-10-CM

## 2017-05-29 ENCOUNTER — Other Ambulatory Visit (HOSPITAL_COMMUNITY): Payer: Self-pay | Admitting: Interventional Radiology

## 2017-05-29 DIAGNOSIS — I671 Cerebral aneurysm, nonruptured: Secondary | ICD-10-CM

## 2017-06-22 ENCOUNTER — Other Ambulatory Visit: Payer: Self-pay | Admitting: Radiology

## 2017-06-22 ENCOUNTER — Other Ambulatory Visit (HOSPITAL_COMMUNITY): Payer: Self-pay | Admitting: Radiology

## 2017-06-22 DIAGNOSIS — Z5309 Procedure and treatment not carried out because of other contraindication: Secondary | ICD-10-CM | POA: Diagnosis present

## 2017-06-22 DIAGNOSIS — J45909 Unspecified asthma, uncomplicated: Secondary | ICD-10-CM | POA: Diagnosis not present

## 2017-06-22 DIAGNOSIS — R51 Headache: Principal | ICD-10-CM

## 2017-06-22 DIAGNOSIS — R519 Headache, unspecified: Secondary | ICD-10-CM

## 2017-06-22 DIAGNOSIS — E669 Obesity, unspecified: Secondary | ICD-10-CM | POA: Diagnosis not present

## 2017-06-22 DIAGNOSIS — I739 Peripheral vascular disease, unspecified: Secondary | ICD-10-CM | POA: Diagnosis not present

## 2017-06-22 DIAGNOSIS — Z6831 Body mass index (BMI) 31.0-31.9, adult: Secondary | ICD-10-CM | POA: Diagnosis not present

## 2017-06-22 DIAGNOSIS — R030 Elevated blood-pressure reading, without diagnosis of hypertension: Secondary | ICD-10-CM | POA: Diagnosis not present

## 2017-06-22 DIAGNOSIS — I671 Cerebral aneurysm, nonruptured: Secondary | ICD-10-CM | POA: Diagnosis not present

## 2017-06-22 LAB — PLATELET INHIBITION P2Y12: PLATELET FUNCTION P2Y12: 105 [PRU] — AB (ref 194–418)

## 2017-06-25 ENCOUNTER — Encounter (HOSPITAL_COMMUNITY)
Admission: RE | Admit: 2017-06-25 | Discharge: 2017-06-25 | Disposition: A | Discharge status: Home / Self Care | Payer: 59 | Source: Ambulatory Visit | Attending: Interventional Radiology | Admitting: Interventional Radiology

## 2017-06-25 ENCOUNTER — Encounter (HOSPITAL_COMMUNITY): Payer: Self-pay

## 2017-06-25 ENCOUNTER — Other Ambulatory Visit: Payer: Self-pay

## 2017-06-25 DIAGNOSIS — Z5309 Procedure and treatment not carried out because of other contraindication: Secondary | ICD-10-CM | POA: Diagnosis not present

## 2017-06-25 LAB — CBC WITH DIFFERENTIAL/PLATELET
Basophils Absolute: 0 10*3/uL (ref 0.0–0.1)
Basophils Relative: 0 %
EOS PCT: 2 %
Eosinophils Absolute: 0.1 10*3/uL (ref 0.0–0.7)
HEMATOCRIT: 39.8 % (ref 36.0–46.0)
Hemoglobin: 13 g/dL (ref 12.0–15.0)
LYMPHS ABS: 2.8 10*3/uL (ref 0.7–4.0)
LYMPHS PCT: 50 %
MCH: 29 pg (ref 26.0–34.0)
MCHC: 32.7 g/dL (ref 30.0–36.0)
MCV: 88.6 fL (ref 78.0–100.0)
Monocytes Absolute: 0.3 10*3/uL (ref 0.1–1.0)
Monocytes Relative: 6 %
Neutro Abs: 2.3 10*3/uL (ref 1.7–7.7)
Neutrophils Relative %: 42 %
Platelets: 261 10*3/uL (ref 150–400)
RBC: 4.49 MIL/uL (ref 3.87–5.11)
RDW: 13.9 % (ref 11.5–15.5)
WBC: 5.6 10*3/uL (ref 4.0–10.5)

## 2017-06-25 LAB — BASIC METABOLIC PANEL
Anion gap: 11 (ref 5–15)
BUN: 7 mg/dL (ref 6–20)
CHLORIDE: 106 mmol/L (ref 101–111)
CO2: 20 mmol/L — ABNORMAL LOW (ref 22–32)
Calcium: 9.7 mg/dL (ref 8.9–10.3)
Creatinine, Ser: 0.72 mg/dL (ref 0.44–1.00)
GFR calc Af Amer: >60 mL/min (ref >60)
GFR calc non Af Amer: >60 mL/min (ref >60)
Glucose, Bld: 103 mg/dL — ABNORMAL HIGH (ref 65–99)
POTASSIUM: 3.9 mmol/L (ref 3.5–5.1)
SODIUM: 137 mmol/L (ref 135–145)

## 2017-06-25 LAB — PROTIME-INR
INR: 0.95
Prothrombin Time: 12.6 seconds (ref 11.4–15.2)

## 2017-06-25 LAB — PLATELET INHIBITION P2Y12: Platelet Function  P2Y12: 80 [PRU] — ABNORMAL LOW (ref 194–418)

## 2017-06-25 LAB — APTT: aPTT: 24 seconds (ref 24–36)

## 2017-06-25 NOTE — Progress Notes (Signed)
PCP - does not have one currently established Cardiologist - patient denies  Chest x-ray - n/a EKG - n/a Stress Test - patient denies ECHO - patient deneis Cardiac Cath - patient denies  Sleep Study - patient denies   Blood Thinner and Aspirin Instructions: Patient instructed by Dr. Fatima Sangereveshwar's office to take ASA and plavix the morning of surgery.   Anesthesia review: yes  Patient denies shortness of breath, fever, cough and chest pain at PAT appointment   Patient verbalized understanding of instructions that were given to them at the PAT appointment. Patient was also instructed that they will need to review over the PAT instructions again at home before surgery.

## 2017-06-25 NOTE — Pre-Procedure Instructions (Signed)
Anita Rhodes  06/25/2017      [id number] [place], [place] - [address] [place] [id number] Phone: [phone] Fax: [phone]    Your procedure is scheduled on 06/27/2017.  [facility] at 0700 [facility].M.  Call this number if you have problems the morning of surgery:  [phone]   Remember:  Do not eat food or drink liquids after midnight.   Continue all medications as directed by your physician except follow these medication instructions before surgery below   Take these medicines the morning of surgery with A SIP OF WATER: Albuterol (Proventil, Ventolin) inhaler - if needed for shortness of breath or wheezing  7 days prior to surgery STOP taking any Aleve, Naproxen, Ibuprofen, Motrin, Advil, Goody's, BC's, all herbal medications, fish oil, and all vitamins  Follow your doctors instructions regarding your Aspirin and Plavix.  If no instructions were given by your doctor, then you will need to call Dr. Fatima Sangereveshwar's office to get instructions.      Do not wear jewelry, make-up or nail polish.  Do not wear lotions, powders, or perfumes, or deodorant.  Do not shave 48 hours prior to surgery.    Do not bring valuables to the hospital.  [facility] is not responsible for any belongings or valuables.  Hearing aids, eyeglasses, contacts, dentures or bridgework may not be worn into surgery.  Leave your suitcase in the car.  After surgery it may be brought to your room.  For patients admitted to the hospital, discharge time will be determined by your treatment team.  Patients discharged the day of surgery will not be allowed to drive home.   Name and phone number of your driver:    Special instructions:   [place]- Preparing For Surgery  Before surgery, you can play an important role. Because skin is not sterile, your skin needs to be as free of germs as possible. You can reduce the number of germs on your  skin by washing with CHG (chlorahexidine gluconate) Soap before surgery.  CHG is an antiseptic cleaner which kills germs and bonds with the skin to continue killing germs even after washing.  Please do not use if you have an allergy to CHG or antibacterial soaps. If your skin becomes reddened/irritated stop using the CHG.  Do not shave (including legs and underarms) for at least 48 hours prior to first CHG shower. It is OK to shave your face.  Please follow these instructions carefully.   1. Shower the NIGHT BEFORE SURGERY and the MORNING OF SURGERY with CHG.   2. If you chose to wash your hair, wash your hair first as usual with your normal shampoo.  3. After you shampoo, rinse your hair and body thoroughly to remove the shampoo.  4. Use CHG as you would any other liquid soap. You can apply CHG directly to the skin and wash gently with a scrungie or a clean washcloth.   5. Apply the CHG Soap to your body ONLY FROM THE NECK DOWN.  Do not use on open wounds or open sores. Avoid contact with your eyes, ears, mouth and genitals (private parts). Wash Face and genitals (private parts)  with your normal soap.  6. Wash thoroughly, paying special attention to the area where your surgery will be performed.  7. Thoroughly rinse your body with warm water from the neck down.  8. DO NOT shower/wash with your normal soap after using  and rinsing off the CHG Soap.  9. Pat yourself dry with a CLEAN TOWEL.  10. Wear CLEAN PAJAMAS to bed the night before surgery, wear comfortable clothes the morning of surgery  11. Place CLEAN SHEETS on your bed the night of your first shower and DO NOT SLEEP WITH PETS.    Day of Surgery: Shower as stated above. Do not apply any deodorants/lotions. Please wear clean clothes to the hospital/surgery center.      Please read over the following fact sheets that you were given.

## 2017-06-26 ENCOUNTER — Other Ambulatory Visit: Payer: Self-pay | Admitting: General Surgery

## 2017-06-26 NOTE — Anesthesia Preprocedure Evaluation (Addendum)
Anesthesia Evaluation  Patient identified by MRN, date of birth, ID band Patient awake    Reviewed: Allergy & Precautions, H&P , NPO status , Patient's Chart, lab work & pertinent test results  Airway Mallampati: II  TM Distance: >3 FB Neck ROM: Limited    Dental  (+) Teeth Intact, Dental Advidsory Given   Pulmonary asthma ,    Pulmonary exam normal breath sounds clear to auscultation       Cardiovascular + Peripheral Vascular Disease  Normal cardiovascular exam Rhythm:Regular Rate:Normal     Neuro/Psych  Headaches, Intracranial aneurysm negative psych ROS   GI/Hepatic negative GI ROS, Neg liver ROS,   Endo/Other  Obesity  Renal/GU negative Renal ROS  negative genitourinary   Musculoskeletal negative musculoskeletal ROS (+)   Abdominal (+) + obese,   Peds  Hematology negative hematology ROS (+)   Anesthesia Other Findings   Reproductive/Obstetrics                            Anesthesia Physical  Anesthesia Plan  ASA: III  Anesthesia Plan: General   Post-op Pain Management:    Induction: Intravenous  PONV Risk Score and Plan: 3 and Treatment may vary due to age or medical condition, Ondansetron, Dexamethasone and Midazolam  Airway Management Planned: Oral ETT  Additional Equipment: Arterial line  Intra-op Plan:   Post-operative Plan: Extubation in OR  Informed Consent: I have reviewed the patients History and Physical, chart, labs and discussed the procedure including the risks, benefits and alternatives for the proposed anesthesia with the patient or authorized representative who has indicated his/her understanding and acceptance.   Dental Advisory Given  Plan Discussed with: CRNA  Anesthesia Plan Comments: (May start procedure as MAC if surgeon requests for angiogram)        Anesthesia Quick Evaluation

## 2017-06-26 NOTE — Progress Notes (Signed)
Anesthesia Chart Review:  Pt is a 42 year old female scheduled for cerebral arteriogram with probable embolization R ICA aneurysm on 06/27/2017 with Julieanne CottonSanjeev Deveshwar, MD  - Pt does not have a PCP  PMH includes:  ICA aneurysms (s/p L ICA pipeline stent, aneurysm occlusion); asthma.  Never smoker. BMI 31.5  Medications include: Albuterol, ASA 325 mg, Plavix  VS:  BP (!) 144/99   Pulse 90   Temp 36.6 C   Resp 20   Ht 5' 4.5" (1.638 m)   Wt 186 lb 6.4 oz (84.6 kg)   SpO2 99%   BMI 31.50 kg/m   - BP was not rechecked at pre-admission testing.  - BP 145/109 from angiogram H&P 05/22/17 by Loyce DysKacie Matthews, PA - ED visit 04/22/14 BP was 136/106 - Pt does not have dx HTN.   Preoperative labs reviewed.   - P2y12 80 - Labs reviewed by Ralene MuskratPamela Turpin, PA  Reviewed case with Dr. Chaney MallingHodierne.  It appears pt has long-standing undiagnosed, untreated HTN.  Pt will need PCP evaluation prior to surgery. I notified Morrie Sheldonshley in Dr. Fatima Sangereveshwar's office.   Rica Mastngela Kayslee Furey, FNP-BC [facility]Anesthesiology Phone: [phone] 06/26/2017 2:52 PM

## 2017-06-27 ENCOUNTER — Encounter (HOSPITAL_COMMUNITY)
Admission: RE | Disposition: A | Discharge status: Home / Self Care | Payer: Self-pay | Source: Ambulatory Visit | Attending: Interventional Radiology

## 2017-06-27 ENCOUNTER — Ambulatory Visit (HOSPITAL_COMMUNITY): Admission: RE | Admit: 2017-06-27 | Payer: 59 | Source: Ambulatory Visit

## 2017-06-27 ENCOUNTER — Encounter (HOSPITAL_COMMUNITY): Payer: Self-pay | Admitting: *Deleted

## 2017-06-27 ENCOUNTER — Ambulatory Visit (HOSPITAL_COMMUNITY): Payer: 59 | Admitting: Certified Registered Nurse Anesthetist

## 2017-06-27 ENCOUNTER — Ambulatory Visit (HOSPITAL_COMMUNITY): Payer: 59 | Admitting: Emergency Medicine

## 2017-06-27 ENCOUNTER — Encounter (HOSPITAL_COMMUNITY): Payer: Self-pay

## 2017-06-27 ENCOUNTER — Ambulatory Visit (HOSPITAL_COMMUNITY)
Admission: RE | Admit: 2017-06-27 | Discharge: 2017-06-27 | Disposition: A | Discharge status: Home / Self Care | Payer: 59 | Source: Ambulatory Visit | Attending: Interventional Radiology | Admitting: Interventional Radiology

## 2017-06-27 DIAGNOSIS — R030 Elevated blood-pressure reading, without diagnosis of hypertension: Secondary | ICD-10-CM | POA: Insufficient documentation

## 2017-06-27 DIAGNOSIS — Z5309 Procedure and treatment not carried out because of other contraindication: Secondary | ICD-10-CM | POA: Insufficient documentation

## 2017-06-27 DIAGNOSIS — I739 Peripheral vascular disease, unspecified: Secondary | ICD-10-CM | POA: Insufficient documentation

## 2017-06-27 DIAGNOSIS — Z6831 Body mass index (BMI) 31.0-31.9, adult: Secondary | ICD-10-CM | POA: Insufficient documentation

## 2017-06-27 DIAGNOSIS — I671 Cerebral aneurysm, nonruptured: Secondary | ICD-10-CM | POA: Insufficient documentation

## 2017-06-27 DIAGNOSIS — E669 Obesity, unspecified: Secondary | ICD-10-CM | POA: Insufficient documentation

## 2017-06-27 DIAGNOSIS — J45909 Unspecified asthma, uncomplicated: Secondary | ICD-10-CM | POA: Insufficient documentation

## 2017-06-27 HISTORY — PX: RADIOLOGY WITH ANESTHESIA: SHX6223

## 2017-06-27 LAB — PLATELET INHIBITION P2Y12: Platelet Function  P2Y12: 83 [PRU] — ABNORMAL LOW (ref 194–418)

## 2017-06-27 LAB — POCT PREGNANCY, URINE: Preg Test, Ur: NEGATIVE

## 2017-06-27 SURGERY — RADIOLOGY WITH ANESTHESIA
Anesthesia: General

## 2017-06-27 MED ORDER — CLOPIDOGREL BISULFATE 75 MG PO TABS: 75.0000 mg | ORAL_TABLET | ORAL | Status: DC

## 2017-06-27 MED ORDER — ASPIRIN EC 325 MG PO TBEC: 325.0000 mg | DELAYED_RELEASE_TABLET | ORAL | Status: DC

## 2017-06-27 MED ORDER — MIDAZOLAM HCL 2 MG/2ML IJ SOLN
INTRAMUSCULAR | Status: AC
  Filled 2017-06-27: qty 4

## 2017-06-27 MED ORDER — FENTANYL CITRATE (PF) 250 MCG/5ML IJ SOLN
INTRAMUSCULAR | Status: AC
  Filled 2017-06-27: qty 5

## 2017-06-27 MED ORDER — NIMODIPINE 30 MG PO CAPS: 0.0000 mg | ORAL_CAPSULE | ORAL | Status: DC

## 2017-06-27 MED ORDER — CEFAZOLIN SODIUM-DEXTROSE 2-4 GM/100ML-% IV SOLN: 2.0000 g | INTRAVENOUS | Status: DC

## 2017-06-27 MED ORDER — SODIUM CHLORIDE 0.9 % IV SOLN: INTRAVENOUS | Status: DC

## 2017-06-27 NOTE — Progress Notes (Signed)
IV d/c due to procedure cancelled. Family at bedside. Alert and orient. No c/o pain voiced. D/c home

## 2017-06-27 NOTE — Progress Notes (Signed)
Patient ID: Anita Rhodes, female   DOB: [birth year], 42 y.o.   MRN: [id number]  The patient's BP today is quite high.  She is currently 176/111.  It was even higher upon arrival.  I have discussed with Dr. Corliss Skainseveshwar and it is unsafe to proceed with a BP that high due to risk of hemorrhage.  The patient states she has been told by Dr. Corliss Skainseveshwar as well as neurology over the last month that she needs to see her PCP to have this addressed.  She called to make an appointment, but apparently she has been discharged from her PCP's practice so she hasn't been able to see anyone.  We discussed today that she needs to find a new PCP or go to an urgent care that can follow patient's to have her BP addressed and managed.  Once her BP is improved then we can proceed with intervention.  The patient understands and is agreeable.  Letha CapeKelly E Merdith Adan 8:50 AM 06/27/2017

## 2017-06-28 ENCOUNTER — Encounter (HOSPITAL_COMMUNITY): Payer: Self-pay | Admitting: Interventional Radiology

## 2017-07-03 ENCOUNTER — Emergency Department (HOSPITAL_COMMUNITY): Payer: 59

## 2017-07-03 ENCOUNTER — Emergency Department (HOSPITAL_COMMUNITY)
Admission: EM | Admit: 2017-07-03 | Discharge: 2017-07-04 | Disposition: A | Discharge status: Home / Self Care | Payer: 59 | Attending: Emergency Medicine | Admitting: Emergency Medicine

## 2017-07-03 ENCOUNTER — Encounter (HOSPITAL_COMMUNITY): Payer: Self-pay | Admitting: *Deleted

## 2017-07-03 DIAGNOSIS — J45909 Unspecified asthma, uncomplicated: Secondary | ICD-10-CM | POA: Insufficient documentation

## 2017-07-03 DIAGNOSIS — Z7902 Long term (current) use of antithrombotics/antiplatelets: Secondary | ICD-10-CM | POA: Insufficient documentation

## 2017-07-03 DIAGNOSIS — R51 Headache: Secondary | ICD-10-CM | POA: Diagnosis not present

## 2017-07-03 DIAGNOSIS — I10 Essential (primary) hypertension: Secondary | ICD-10-CM | POA: Diagnosis not present

## 2017-07-03 DIAGNOSIS — R42 Dizziness and giddiness: Secondary | ICD-10-CM | POA: Diagnosis present

## 2017-07-03 DIAGNOSIS — I671 Cerebral aneurysm, nonruptured: Secondary | ICD-10-CM | POA: Insufficient documentation

## 2017-07-03 DIAGNOSIS — J069 Acute upper respiratory infection, unspecified: Secondary | ICD-10-CM | POA: Insufficient documentation

## 2017-07-03 DIAGNOSIS — R059 Cough, unspecified: Secondary | ICD-10-CM

## 2017-07-03 DIAGNOSIS — Z79899 Other long term (current) drug therapy: Secondary | ICD-10-CM | POA: Insufficient documentation

## 2017-07-03 DIAGNOSIS — R519 Headache, unspecified: Secondary | ICD-10-CM

## 2017-07-03 DIAGNOSIS — Z7982 Long term (current) use of aspirin: Secondary | ICD-10-CM | POA: Insufficient documentation

## 2017-07-03 DIAGNOSIS — R05 Cough: Secondary | ICD-10-CM

## 2017-07-03 LAB — I-STAT CHEM 8, ED
BUN: 10 mg/dL (ref 6–20)
CALCIUM ION: 1.26 mmol/L (ref 1.15–1.40)
CHLORIDE: 104 mmol/L (ref 101–111)
Creatinine, Ser: 0.7 mg/dL (ref 0.44–1.00)
GLUCOSE: 94 mg/dL (ref 65–99)
HCT: 44 % (ref 36.0–46.0)
Hemoglobin: 15 g/dL (ref 12.0–15.0)
Potassium: 3.4 mmol/L — ABNORMAL LOW (ref 3.5–5.1)
Sodium: 139 mmol/L (ref 135–145)
TCO2: 23 mmol/L (ref 22–32)

## 2017-07-03 LAB — COMPREHENSIVE METABOLIC PANEL
ALK PHOS: 85 U/L (ref 38–126)
ALT: 36 U/L (ref 14–54)
AST: 31 U/L (ref 15–41)
Albumin: 4.4 g/dL (ref 3.5–5.0)
Anion gap: 10 (ref 5–15)
BILIRUBIN TOTAL: 0.3 mg/dL (ref 0.3–1.2)
BUN: 12 mg/dL (ref 6–20)
CO2: 22 mmol/L (ref 22–32)
CREATININE: 0.77 mg/dL (ref 0.44–1.00)
Calcium: 10.1 mg/dL (ref 8.9–10.3)
Chloride: 104 mmol/L (ref 101–111)
GFR calc Af Amer: >60 mL/min (ref >60)
GLUCOSE: 98 mg/dL (ref 65–99)
POTASSIUM: 3.4 mmol/L — AB (ref 3.5–5.1)
Sodium: 136 mmol/L (ref 135–145)
TOTAL PROTEIN: 8.8 g/dL — AB (ref 6.5–8.1)

## 2017-07-03 LAB — CBC WITH DIFFERENTIAL/PLATELET
BASOS ABS: 0 10*3/uL (ref 0.0–0.1)
Basophils Relative: 0 %
Eosinophils Absolute: 0.1 10*3/uL (ref 0.0–0.7)
Eosinophils Relative: 1 %
HCT: 41.8 % (ref 36.0–46.0)
HEMOGLOBIN: 13.2 g/dL (ref 12.0–15.0)
LYMPHS PCT: 37 %
Lymphs Abs: 1.7 10*3/uL (ref 0.7–4.0)
MCH: 28.1 pg (ref 26.0–34.0)
MCHC: 31.6 g/dL (ref 30.0–36.0)
MCV: 88.9 fL (ref 78.0–100.0)
MONO ABS: 0.7 10*3/uL (ref 0.1–1.0)
Monocytes Relative: 16 %
NEUTROS PCT: 46 %
Neutro Abs: 2 10*3/uL (ref 1.7–7.7)
Platelets: 239 10*3/uL (ref 150–400)
RBC: 4.7 MIL/uL (ref 3.87–5.11)
RDW: 13.5 % (ref 11.5–15.5)
WBC: 4.4 10*3/uL (ref 4.0–10.5)

## 2017-07-03 LAB — I-STAT BETA HCG BLOOD, ED (MC, WL, AP ONLY): I-stat hCG, quantitative: <5 m[IU]/mL (ref <5)

## 2017-07-03 MED ORDER — LABETALOL HCL 5 MG/ML IV SOLN
10.0000 mg | Freq: Once | INTRAVENOUS | Status: AC
  Administered 2017-07-04: 5 mg via INTRAVENOUS
  Filled 2017-07-03: qty 4

## 2017-07-03 MED ORDER — IOPAMIDOL (ISOVUE-370) INJECTION 76%
INTRAVENOUS | Status: AC
  Administered 2017-07-03: 100 mL via INTRAVENOUS
  Filled 2017-07-03: qty 100

## 2017-07-03 MED ORDER — SODIUM CHLORIDE 0.9 % IJ SOLN
INTRAMUSCULAR | Status: AC
  Filled 2017-07-03: qty 50

## 2017-07-03 MED ORDER — PROCHLORPERAZINE EDISYLATE 5 MG/ML IJ SOLN
10.0000 mg | Freq: Once | INTRAMUSCULAR | Status: AC
  Administered 2017-07-03: 10 mg via INTRAVENOUS
  Filled 2017-07-03: qty 2

## 2017-07-03 MED ORDER — DIPHENHYDRAMINE HCL 50 MG/ML IJ SOLN
25.0000 mg | Freq: Once | INTRAMUSCULAR | Status: AC
  Administered 2017-07-03: 25 mg via INTRAVENOUS
  Filled 2017-07-03: qty 1

## 2017-07-03 MED ORDER — SODIUM CHLORIDE 0.9 % IV BOLUS (SEPSIS)
1000.0000 mL | Freq: Once | INTRAVENOUS | Status: AC
  Administered 2017-07-03: 1000 mL via INTRAVENOUS

## 2017-07-03 NOTE — ED Triage Notes (Signed)
Pt reports sudden onset of neck pain and dizziness today.  She states she has a carotid artery aneurysm in the right side and had one ruptured 4 years ago.  She reports sxs are the same.  She describes pain in her neck as sharp stabbing pain.  Pt is A&Ox 4.

## 2017-07-03 NOTE — ED Provider Notes (Signed)
[facility]-EMERGENCY DEPT Provider Note   CSN: [id number] Arrival date & time: 07/03/17  1703     History   Chief Complaint Chief Complaint  Patient presents with  . Dizziness  . Torticollis    HPI Anita Rhodes is a 42 y.o. female.  HPI   Headaches have been coming and going over the last 3 weeks, with worsening of headache around lunchtime today.  Headache 8/10, top of head with right sided neck pain. Has had headaches on and off but no neck pain like had today. The neck pain was similar to pain from aneurysm before so came for evaluation. Has had visual changes in the right eye since prior aneurysm, no acute changes today.  No numbness or weakness on one side or other. No trouble talking or walking. No fevers.   She was recently started on nifedipine for htn in the last week.    Also reports cough for 2 days, nasal congestion, pinching chest pain that comes and goes and is worsened by coughing and palpation.  Reports she has not been eating or drinking as much since she started the blood pressure medications she has not felt like it. Also notes constipation since starting the bp meds.   Past Medical History:  Diagnosis Date  . Asthma   . Brain aneurysm   . Family history of brain aneurysm   . Frequent headaches   . Wears glasses     Patient Active Problem List   Diagnosis Date Noted  . Increased anion gap metabolic acidosis 10/22/2012  . Asthma 10/22/2012  . New onset of headaches 10/22/2012  . Subarachnoid hemorrhage (HCC) 10/22/2012  . Respiratory failure, post-operative (HCC) 10/22/2012    Past Surgical History:  Procedure Laterality Date  . arthroscopic knee    . IR 3D INDEPENDENT WKST  05/22/2017  . IR ANGIO INTRA EXTRACRAN SEL COM CAROTID INNOMINATE BILAT MOD SED  05/22/2017  . IR ANGIO VERTEBRAL SEL VERTEBRAL BILAT MOD SED  05/22/2017  . OTHER SURGICAL HISTORY     aneurysm repair.  Marland Kitchen RADIOLOGY WITH ANESTHESIA N/A 06/27/2017   Procedure: RADIOLOGY WITH ANESTHESIA, EMBOLIZATION;  Surgeon: Julieanne Cotton, MD;  Location: [facility] OR;  Service: Radiology;  Laterality: N/A;    OB History    No data available       Home Medications    Prior to Admission medications   Medication Sig Start Date End Date Taking? Authorizing Provider  albuterol (PROVENTIL HFA;VENTOLIN HFA) 108 (90 BASE) MCG/ACT inhaler Inhale 2 puffs into the lungs every 6 (six) hours as needed for wheezing or shortness of breath.   Yes [provider]  aspirin 325 MG tablet Take 1 tablet (325 mg total) by mouth daily with breakfast. 11/02/12  Yes Colon Branch, MD  benzonatate (TESSALON) 200 MG capsule Take 20 mg by mouth 3 (three) times daily. 07/02/17  Yes [provider]  butalbital-acetaminophen-caffeine (FIORICET WITH CODEINE) 50-325-40-30 MG per capsule Take 1 capsule by mouth daily as needed for headache.  04/02/13  Yes [provider]  Ibuprofen-Diphenhydramine HCl (ADVIL PM) 200-25 MG CAPS Take 1 tablet by mouth at bedtime as needed (for sleep/headaches.).   Yes [provider]  Multiple Vitamin (MULTIVITAMIN WITH MINERALS) TABS tablet Take 1 tablet by mouth daily.   Yes [provider]  NIFEdipine (PROCARDIA-XL/ADALAT-CC/NIFEDICAL-XL) 30 MG 24 hr tablet Take 30 mg by mouth daily. 06/27/17  Yes [provider]  traMADol (ULTRAM) 50 MG tablet Take 50 mg by mouth  every 6 (six) hours as needed for pain. for pain 06/27/17  Yes [provider]  clopidogrel (PLAVIX) 75 MG tablet Take 75 mg by mouth daily. 06/17/17   [provider]    Family History Family History  Problem Relation Age of Onset  . Hyperlipidemia Father   . Aneurysm Father   . Stroke Father   . Hypertension Mother   . Hypertension Other   . Hypertension Paternal Grandfather     Social History Social History   Tobacco Use  . Smoking status: Never Smoker  . Smokeless tobacco: Never Used  Substance Use Topics    . Alcohol use: Yes    Comment: social  . Drug use: No     Allergies   Banana and Fish-derived products   Review of Systems Review of Systems  Constitutional: Positive for chills. Negative for fever.  HENT: Positive for congestion. Negative for sore throat.   Eyes: Negative for visual disturbance.  Respiratory: Positive for cough. Negative for shortness of breath.   Cardiovascular: Negative for chest pain.  Gastrointestinal: Negative for abdominal pain, nausea and vomiting.  Genitourinary: Negative for difficulty urinating.  Musculoskeletal: Negative for back pain and neck pain.  Skin: Negative for rash.  Neurological: Positive for light-headedness and headaches. Negative for syncope, facial asymmetry, speech difficulty, weakness and numbness.     Physical Exam Updated Vital Signs BP (!) 142/92   Pulse 100   Temp 98.9 F (37.2 C) (Oral)   Resp (!) 22   Wt 83.9 kg (185 lb)   LMP 06/21/2017   SpO2 99%   BMI 31.26 kg/m   Physical Exam  Constitutional: She is oriented to person, place, and time. She appears well-developed and well-nourished. No distress.  HENT:  Head: Normocephalic and atraumatic.  Eyes: Conjunctivae and EOM are normal.  Neck: Normal range of motion.  Cardiovascular: Normal rate, regular rhythm, normal heart sounds and intact distal pulses. Exam reveals no gallop and no friction rub.  No murmur heard. Pulmonary/Chest: Effort normal and breath sounds normal. No respiratory distress. She has no wheezes. She has no rales.  Chest wall tenderness   Abdominal: Soft. She exhibits no distension. There is no tenderness. There is no guarding.  Musculoskeletal: She exhibits no edema.       Cervical back: She exhibits tenderness (right sided).  Neurological: She is alert and oriented to person, place, and time. She has normal strength. No cranial nerve deficit or sensory deficit. Coordination and gait normal. GCS eye subscore is 4. GCS verbal subscore is 5. GCS  motor subscore is 6.  Skin: Skin is warm and dry. No rash noted. She is not diaphoretic. No erythema.  Nursing note and vitals reviewed.    ED Treatments / Results  Labs (all labs ordered are listed, but only abnormal results are displayed) Labs Reviewed  COMPREHENSIVE METABOLIC PANEL - Abnormal; Notable for the following components:      Result Value   Potassium 3.4 (*)    Total Protein 8.8 (*)    All other components within normal limits  I-STAT CHEM 8, ED - Abnormal; Notable for the following components:   Potassium 3.4 (*)    All other components within normal limits  CBC WITH DIFFERENTIAL/PLATELET  I-STAT BETA HCG BLOOD, ED ([facility], [facility], [facility] ONLY)    EKG  EKG Interpretation None       Radiology Ct Angio Head W Or Wo Contrast  Result Date: 07/03/2017 CLINICAL DATA:  Initial evaluation for acute neck  pain with dizziness. History of aneurysm. EXAM: CT ANGIOGRAPHY HEAD AND NECK TECHNIQUE: Multidetector CT imaging of the head and neck was performed using the standard protocol during bolus administration of intravenous contrast. Multiplanar CT image reconstructions and MIPs were obtained to evaluate the vascular anatomy. Carotid stenosis measurements (when applicable) are obtained utilizing NASCET criteria, using the distal internal carotid diameter as the denominator. CONTRAST:  100mL ISOVUE-370 IOPAMIDOL (ISOVUE-370) INJECTION 76% COMPARISON:  Prior arteriogram from 05/22/2017. FINDINGS: Brain: Cerebral volume within normal limits for patient age. No evidence for acute intracranial hemorrhage. No findings to suggest acute large vessel territory infarct. No mass lesion, midline shift, or mass effect. Ventricles are normal in size without evidence for hydrocephalus. No extra-axial fluid collection identified. Vascular: No hyperdense vessel. Metallic stent noted at the supraclinoid left ICA. Skull: Scalp soft tissues demonstrate no acute abnormality. Calvarium intact. Sinuses/Orbits: Globes  and orbital soft tissues within normal limits. Visualized paranasal sinuses are clear. No mastoid effusion. CTA NECK FINDINGS Aortic arch: Visualized aortic arch of normal caliber with normal branch pattern. No flow-limiting stenosis about the origin of the great vessels. Visualized subclavian arteries widely patent. Right carotid system: Right common and internal carotid arteries widely patent without stenosis, dissection, or occlusion. No atheromatous narrowing about the right carotid bifurcation. Left carotid system: Left common and internal carotid arteries widely patent without stenosis, dissection, or occlusion. No atheromatous narrowing about the left carotid bifurcation. Vertebral arteries: Both of the vertebral arteries arise from the subclavian arteries and are widely patent to the skull base without stenosis, dissection, or occlusion. Skeleton: No acute osseous abnormality. No worrisome lytic or blastic osseous lesions. Other neck: No acute soft tissue abnormality within the neck. Salivary glands are normal. 9 mm left thyroid nodule noted, of doubtful clinical significance. No adenopathy. Upper chest: Visualized upper mediastinum demonstrates no acute abnormality. Substernal extension of the thyroid on the left noted. Partially visualized lungs are clear. Mild paraseptal emphysema noted. Review of the MIP images confirms the above findings CTA HEAD FINDINGS Anterior circulation: Petrous segments patent bilaterally. Pipeline stent in place within the cavernous/supraclinoid left ICA without complication. Patent flow seen through the stent itself. Left ICA terminus widely patent. Cavernous and supraclinoid right ICA widely patent without stenosis. Tiny saccular aneurysm measuring approximately 1.5 x 2 mm seen extending from the medial aspect of the cavernous right ICA (series 11, image 235), grossly stable from previous. Right ICA terminus widely patent. A1 segments widely patent. Patent anterior  communicating artery. Anterior cerebral arteries widely patent to their distal aspects without stenosis. M1 segments widely patent bilaterally. Normal MCA bifurcations. No proximal M2 occlusion. Distal MCA branches well opacified and symmetric. Posterior circulation: Vertebral arteries patent to the vertebrobasilar junction without stenosis. Posterior inferior cerebral arteries patent bilaterally. Basilar artery somewhat tortuous but widely patent to its distal aspect. Superior cerebral arteries patent bilaterally. Hypoplastic left P1 segment. PCAs widely patent to their distal aspects without stenosis. Venous sinuses: Patent. Anatomic variants: None significant. Delayed phase: No abnormal enhancement. Review of the MIP images confirms the above findings IMPRESSION: 1. Negative CTA of the head and neck. No acute vascular abnormality identified. No large vessel occlusion. No high-grade or flow-limiting stenosis. 2. Continued patency of pipeline stent position within the cavernous/supraclinoid left ICA. No recurrent aneurysm identified. 3. 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 Electronically Signed   By: Rise MuBenjamin  McClintock M.D.   On: 07/03/2017 21:41   Ct Angio Neck W And/or Wo Contrast  Result Date: 07/03/2017 CLINICAL  DATA:  Initial evaluation for acute neck pain with dizziness. History of aneurysm. EXAM: CT ANGIOGRAPHY HEAD AND NECK TECHNIQUE: Multidetector CT imaging of the head and neck was performed using the standard protocol during bolus administration of intravenous contrast. Multiplanar CT image reconstructions and MIPs were obtained to evaluate the vascular anatomy. Carotid stenosis measurements (when applicable) are obtained utilizing NASCET criteria, using the distal internal carotid diameter as the denominator. CONTRAST:  ISOVUE-370 IOPAMIDOL (ISOVUE-370) INJECTION 76% COMPARISON:  Prior arteriogram from 05/22/2017. FINDINGS: Brain: Cerebral volume within normal limits for patient  age. No evidence for acute intracranial hemorrhage. No findings to suggest acute large vessel territory infarct. No mass lesion, midline shift, or mass effect. Ventricles are normal in size without evidence for hydrocephalus. No extra-axial fluid collection identified. Vascular: No hyperdense vessel. Metallic stent noted at the supraclinoid left ICA. Skull: Scalp soft tissues demonstrate no acute abnormality. Calvarium intact. Sinuses/Orbits: Globes and orbital soft tissues within normal limits. Visualized paranasal sinuses are clear. No mastoid effusion. CTA NECK FINDINGS Aortic arch: Visualized aortic arch of normal caliber with normal branch pattern. No flow-limiting stenosis about the origin of the great vessels. Visualized subclavian arteries widely patent. Right carotid system: Right common and internal carotid arteries widely patent without stenosis, dissection, or occlusion. No atheromatous narrowing about the right carotid bifurcation. Left carotid system: Left common and internal carotid arteries widely patent without stenosis, dissection, or occlusion. No atheromatous narrowing about the left carotid bifurcation. Vertebral arteries: Both of the vertebral arteries arise from the subclavian arteries and are widely patent to the skull base without stenosis, dissection, or occlusion. Skeleton: No acute osseous abnormality. No worrisome lytic or blastic osseous lesions. Other neck: No acute soft tissue abnormality within the neck. Salivary glands are normal. 9 mm left thyroid nodule noted, of doubtful clinical significance. No adenopathy. Upper chest: Visualized upper mediastinum demonstrates no acute abnormality. Substernal extension of the thyroid on the left noted. Partially visualized lungs are clear. Mild paraseptal emphysema noted. Review of the MIP images confirms the above findings CTA HEAD FINDINGS Anterior circulation: Petrous segments patent bilaterally. Pipeline stent in place within the  cavernous/supraclinoid left ICA without complication. Patent flow seen through the stent itself. Left ICA terminus widely patent. Cavernous and supraclinoid right ICA widely patent without stenosis. Tiny saccular aneurysm measuring approximately 1.5 x 2 mm seen extending from the medial aspect of the cavernous right ICA (series 11, image 235), grossly stable from previous. Right ICA terminus widely patent. A1 segments widely patent. Patent anterior communicating artery. Anterior cerebral arteries widely patent to their distal aspects without stenosis. M1 segments widely patent bilaterally. Normal MCA bifurcations. No proximal M2 occlusion. Distal MCA branches well opacified and symmetric. Posterior circulation: Vertebral arteries patent to the vertebrobasilar junction without stenosis. Posterior inferior cerebral arteries patent bilaterally. Basilar artery somewhat tortuous but widely patent to its distal aspect. Superior cerebral arteries patent bilaterally. Hypoplastic left P1 segment. PCAs widely patent to their distal aspects without stenosis. Venous sinuses: Patent. Anatomic variants: None significant. Delayed phase: No abnormal enhancement. Review of the MIP images confirms the above findings IMPRESSION: 1. Negative CTA of the head and neck. No acute vascular abnormality identified. No large vessel occlusion. No high-grade or flow-limiting stenosis. 2. Continued patency of pipeline stent position within the cavernous/supraclinoid left ICA. No recurrent aneurysm identified. 3. 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 Electronically Signed   By: Rise Mu M.D.   On: 07/03/2017 21:41    Procedures Procedures (including critical  care time)  Medications Ordered in ED Medications  sodium chloride 0.9 % injection (not administered)  sodium chloride 0.9 % bolus 1,000 mL (0 mLs Intravenous Stopped 07/03/17 2301)  prochlorperazine (COMPAZINE) injection 10 mg (10 mg Intravenous Given 07/03/17  2043)  diphenhydrAMINE (BENADRYL) injection 25 mg (25 mg Intravenous Given 07/03/17 2043)  iopamidol (ISOVUE-370) 76 % injection (100 mLs Intravenous Contrast Given 07/03/17 2019)  labetalol (NORMODYNE,TRANDATE) injection 10 mg (5 mg Intravenous Given 07/04/17 0006)  ketorolac (TORADOL) 15 MG/ML injection 15 mg (15 mg Intravenous Given 07/04/17 0118)     Initial Impression / Assessment and Plan / ED Course  I have reviewed the triage vital signs and the nursing notes.  Pertinent labs & imaging results that were available during my care of the patient were reviewed by me and considered in my medical decision making (see chart for details).     42 year old female with a history of subarachnoid hemorrhage secondary to ruptured aneurysm now status post stent placement, new aneurysm identified by CTA, with arteriogram noting 1.37mm blister aneurysm right ICA noted 05/2017, with R ICA aneurysm embolization which was scheduled for 2/20 and canceled due to hypertension who presents with headache and neck pain.  CT completed showing stable aneurysm, without signs of bleeding.  She has had intermittent headaches for some time, and headache not described as thunderclap worst headache. Improved with compazine and benadryl.  I doubt SAH at this time given CT findings, improvement.  She has multiple other symptoms including cough, congestion. Doubt pneumonia given clear breath sounds, afebrile, no hypoxia or tachycardia.  EKG without acute changes. Suspect chest wall pain by hx and exam. Tachycardia likely secondary to dehydration.  Was recently evaluated and started on medications for blood pressures which continue to be elevated today. She was given 5mg  IV labetalol. Her neuro exam is normal, no sign of CVA. BP 142/92, recommend continued medications, close PCP Follow up regarding blood pressures and follow up with Dr. Marcheta Grammes of [facility] for further eval/treatment of aneurysm.  Patient discharged in stable condition with  understanding of reasons to return.    Final Clinical Impressions(s) / ED Diagnoses   Final diagnoses:  Acute nonintractable headache, unspecified headache type  Cough  Essential hypertension  Upper respiratory tract infection, unspecified type    ED Discharge Orders    None       Alvira Monday, MD 07/04/17 [phone]

## 2017-07-03 NOTE — ED Notes (Signed)
Patient is ambulating to the rest room.

## 2017-07-04 MED ORDER — KETOROLAC TROMETHAMINE 15 MG/ML IJ SOLN
15.0000 mg | Freq: Once | INTRAMUSCULAR | Status: AC
  Administered 2017-07-04: 15 mg via INTRAVENOUS
  Filled 2017-07-04: qty 1

## 2017-07-05 ENCOUNTER — Telehealth (HOSPITAL_COMMUNITY): Payer: Self-pay | Admitting: Radiology

## 2017-07-24 NOTE — Telephone Encounter (Signed)
Called pt, left VM. Pt to call me back in reference to her BP control. JM

## 2017-07-25 ENCOUNTER — Telehealth (HOSPITAL_COMMUNITY): Payer: Self-pay | Admitting: Radiology

## 2017-07-25 NOTE — Telephone Encounter (Signed)
Pt called and wanted to know what range Dr. Corliss Skainseveshwar wants her BP to be in. Per Dr. Corliss Skainseveshwar her BP's should range between 120-140/80-89. Pt understands and agrees to this range. JM

## 2017-07-26 ENCOUNTER — Other Ambulatory Visit: Payer: Self-pay | Admitting: Radiology

## 2017-07-27 ENCOUNTER — Encounter (HOSPITAL_COMMUNITY): Payer: Self-pay | Admitting: *Deleted

## 2017-07-27 ENCOUNTER — Other Ambulatory Visit: Payer: Self-pay

## 2017-07-27 NOTE — Progress Notes (Signed)
Pt denies SOB, chest pain, and being under the care of a cardiologist. Pt denies having a stress test, echo and cardiac cath. Pt denies having a chest x ray within the last year. Pt made aware to stop taking vitamins, fish oil and herbal medications. Do not take any NSAIDs ie: Ibuprofen, Advil, Naproxen ( Aleve), Motrin, BC and Goody Powder. Pt verbalized understanding of all pre-op instructions. 

## 2017-07-30 ENCOUNTER — Encounter (HOSPITAL_COMMUNITY): Payer: Self-pay

## 2017-07-30 ENCOUNTER — Encounter (HOSPITAL_COMMUNITY)
Admission: RE | Disposition: A | Discharge status: Home / Self Care | Payer: Self-pay | Source: Ambulatory Visit | Attending: Interventional Radiology

## 2017-07-30 ENCOUNTER — Ambulatory Visit (HOSPITAL_COMMUNITY)
Admission: RE | Admit: 2017-07-30 | Discharge: 2017-07-30 | Disposition: A | Discharge status: Home / Self Care | Payer: 59 | Source: Ambulatory Visit | Attending: Interventional Radiology | Admitting: Interventional Radiology

## 2017-07-30 ENCOUNTER — Inpatient Hospital Stay (HOSPITAL_COMMUNITY)
Admission: RE | Admit: 2017-07-30 | Discharge: 2017-08-01 | DRG: 027 | Disposition: A | Discharge status: Home / Self Care | Payer: 59 | Source: Ambulatory Visit | Attending: Interventional Radiology | Admitting: Interventional Radiology

## 2017-07-30 ENCOUNTER — Encounter (HOSPITAL_COMMUNITY): Payer: Self-pay | Admitting: Certified Registered Nurse Anesthetist

## 2017-07-30 ENCOUNTER — Ambulatory Visit (HOSPITAL_COMMUNITY): Payer: 59 | Admitting: Certified Registered"

## 2017-07-30 DIAGNOSIS — J45909 Unspecified asthma, uncomplicated: Secondary | ICD-10-CM | POA: Diagnosis present

## 2017-07-30 DIAGNOSIS — E669 Obesity, unspecified: Secondary | ICD-10-CM | POA: Diagnosis present

## 2017-07-30 DIAGNOSIS — Z8249 Family history of ischemic heart disease and other diseases of the circulatory system: Secondary | ICD-10-CM | POA: Diagnosis not present

## 2017-07-30 DIAGNOSIS — Z91013 Allergy to seafood: Secondary | ICD-10-CM | POA: Diagnosis not present

## 2017-07-30 DIAGNOSIS — I729 Aneurysm of unspecified site: Secondary | ICD-10-CM | POA: Diagnosis not present

## 2017-07-30 DIAGNOSIS — R079 Chest pain, unspecified: Secondary | ICD-10-CM | POA: Diagnosis not present

## 2017-07-30 DIAGNOSIS — Z7982 Long term (current) use of aspirin: Secondary | ICD-10-CM | POA: Diagnosis not present

## 2017-07-30 DIAGNOSIS — I671 Cerebral aneurysm, nonruptured: Principal | ICD-10-CM | POA: Diagnosis present

## 2017-07-30 DIAGNOSIS — I724 Aneurysm of artery of lower extremity: Secondary | ICD-10-CM | POA: Diagnosis not present

## 2017-07-30 DIAGNOSIS — I10 Essential (primary) hypertension: Secondary | ICD-10-CM | POA: Diagnosis present

## 2017-07-30 DIAGNOSIS — Z79899 Other long term (current) drug therapy: Secondary | ICD-10-CM

## 2017-07-30 DIAGNOSIS — Z91018 Allergy to other foods: Secondary | ICD-10-CM

## 2017-07-30 DIAGNOSIS — Z7902 Long term (current) use of antithrombotics/antiplatelets: Secondary | ICD-10-CM | POA: Diagnosis not present

## 2017-07-30 DIAGNOSIS — T148XXA Other injury of unspecified body region, initial encounter: Secondary | ICD-10-CM | POA: Diagnosis not present

## 2017-07-30 DIAGNOSIS — R51 Headache: Secondary | ICD-10-CM | POA: Diagnosis present

## 2017-07-30 HISTORY — PX: RADIOLOGY WITH ANESTHESIA: SHX6223

## 2017-07-30 HISTORY — PX: IR ANGIO VERTEBRAL SEL SUBCLAVIAN INNOMINATE UNI R MOD SED: IMG5365

## 2017-07-30 HISTORY — PX: IR ANGIO INTRA EXTRACRAN SEL INTERNAL CAROTID UNI R MOD SED: IMG5362

## 2017-07-30 HISTORY — DX: Essential (primary) hypertension: I10

## 2017-07-30 HISTORY — PX: IR TRANSCATH/EMBOLIZ: IMG695

## 2017-07-30 HISTORY — PX: IR ANGIOGRAM FOLLOW UP STUDY: IMG697

## 2017-07-30 LAB — CBC WITH DIFFERENTIAL/PLATELET
BASOS ABS: 0 10*3/uL (ref 0.0–0.1)
BASOS PCT: 0 %
Eosinophils Absolute: 0.2 10*3/uL (ref 0.0–0.7)
Eosinophils Relative: 3 %
HEMATOCRIT: 37.4 % (ref 36.0–46.0)
HEMOGLOBIN: 12.1 g/dL (ref 12.0–15.0)
Lymphocytes Relative: 47 %
Lymphs Abs: 3.6 10*3/uL (ref 0.7–4.0)
MCH: 28.4 pg (ref 26.0–34.0)
MCHC: 32.4 g/dL (ref 30.0–36.0)
MCV: 87.8 fL (ref 78.0–100.0)
MONOS PCT: 8 %
Monocytes Absolute: 0.6 10*3/uL (ref 0.1–1.0)
NEUTROS ABS: 3.2 10*3/uL (ref 1.7–7.7)
NEUTROS PCT: 42 %
Platelets: 288 10*3/uL (ref 150–400)
RBC: 4.26 MIL/uL (ref 3.87–5.11)
RDW: 13.9 % (ref 11.5–15.5)
WBC: 7.6 10*3/uL (ref 4.0–10.5)

## 2017-07-30 LAB — COMPREHENSIVE METABOLIC PANEL
ALBUMIN: 3.9 g/dL (ref 3.5–5.0)
ALK PHOS: 74 U/L (ref 38–126)
ALT: 30 U/L (ref 14–54)
AST: 23 U/L (ref 15–41)
Anion gap: 10 (ref 5–15)
BILIRUBIN TOTAL: 0.4 mg/dL (ref 0.3–1.2)
BUN: 12 mg/dL (ref 6–20)
CO2: 21 mmol/L — ABNORMAL LOW (ref 22–32)
Calcium: 9.5 mg/dL (ref 8.9–10.3)
Chloride: 104 mmol/L (ref 101–111)
Creatinine, Ser: 0.83 mg/dL (ref 0.44–1.00)
GFR calc Af Amer: >60 mL/min (ref >60)
GFR calc non Af Amer: >60 mL/min (ref >60)
GLUCOSE: 110 mg/dL — AB (ref 65–99)
Potassium: 3.6 mmol/L (ref 3.5–5.1)
Sodium: 135 mmol/L (ref 135–145)
TOTAL PROTEIN: 7.5 g/dL (ref 6.5–8.1)

## 2017-07-30 LAB — POCT ACTIVATED CLOTTING TIME
ACTIVATED CLOTTING TIME: 191 s
Activated Clotting Time: 164 seconds
Activated Clotting Time: 202 seconds
Activated Clotting Time: 136 seconds

## 2017-07-30 LAB — HEPARIN LEVEL (UNFRACTIONATED): Heparin Unfractionated: <0.1 IU/mL — ABNORMAL LOW (ref 0.30–0.70)

## 2017-07-30 LAB — PROTIME-INR
INR: 0.95
Prothrombin Time: 12.6 seconds (ref 11.4–15.2)

## 2017-07-30 LAB — APTT: APTT: 24 s (ref 24–36)

## 2017-07-30 LAB — PLATELET INHIBITION P2Y12: Platelet Function  P2Y12: 84 [PRU] — ABNORMAL LOW (ref 194–418)

## 2017-07-30 LAB — CORTISOL: Cortisol, Plasma: 1.4 ug/dL

## 2017-07-30 LAB — POCT PREGNANCY, URINE: PREG TEST UR: NEGATIVE

## 2017-07-30 SURGERY — RADIOLOGY WITH ANESTHESIA
Anesthesia: General

## 2017-07-30 MED ORDER — EPTIFIBATIDE 20 MG/10ML IV SOLN
INTRAVENOUS | Status: AC
  Filled 2017-07-30: qty 10

## 2017-07-30 MED ORDER — FENTANYL CITRATE (PF) 100 MCG/2ML IJ SOLN
INTRAMUSCULAR | Status: AC
  Administered 2017-07-30: 50 ug via INTRAVENOUS
  Filled 2017-07-30: qty 2

## 2017-07-30 MED ORDER — OXYCODONE HCL 5 MG PO TABS
5.0000 mg | ORAL_TABLET | Freq: Once | ORAL | Status: AC | PRN
  Administered 2017-07-30: 5 mg via ORAL

## 2017-07-30 MED ORDER — PROPOFOL 10 MG/ML IV BOLUS
INTRAVENOUS | Status: DC | PRN
  Administered 2017-07-30: 150 mg via INTRAVENOUS

## 2017-07-30 MED ORDER — SODIUM CHLORIDE 0.9 % IV SOLN
INTRAVENOUS | Status: DC
  Administered 2017-07-30: 06:00:00 via INTRAVENOUS

## 2017-07-30 MED ORDER — CLOPIDOGREL BISULFATE 75 MG PO TABS
75.0000 mg | ORAL_TABLET | Freq: Every day | ORAL | Status: DC
  Administered 2017-07-30 – 2017-08-01 (×3): 75 mg via ORAL
  Filled 2017-07-30 (×3): qty 1

## 2017-07-30 MED ORDER — HEPARIN (PORCINE) IN NACL 100-0.45 UNIT/ML-% IJ SOLN
INTRAMUSCULAR | Status: AC
  Administered 2017-07-30: 500 [IU]/h via INTRAVENOUS
  Filled 2017-07-30: qty 250

## 2017-07-30 MED ORDER — SUGAMMADEX SODIUM 200 MG/2ML IV SOLN
INTRAVENOUS | Status: DC | PRN
  Administered 2017-07-30: 200 mg via INTRAVENOUS

## 2017-07-30 MED ORDER — IBUPROFEN 200 MG PO TABS
200.0000 mg | ORAL_TABLET | Freq: Every evening | ORAL | Status: DC | PRN
  Administered 2017-08-01: 200 mg via ORAL
  Filled 2017-07-30: qty 1

## 2017-07-30 MED ORDER — NIMODIPINE 30 MG PO CAPS
ORAL_CAPSULE | ORAL | Status: AC
  Administered 2017-07-30: 60 mg via ORAL
  Filled 2017-07-30: qty 2

## 2017-07-30 MED ORDER — NITROGLYCERIN 1 MG/10 ML FOR IR/CATH LAB
INTRA_ARTERIAL | Status: AC
  Filled 2017-07-30: qty 10

## 2017-07-30 MED ORDER — KETOROLAC TROMETHAMINE 30 MG/ML IJ SOLN
INTRAMUSCULAR | Status: AC
  Administered 2017-07-30: 30 mg
  Filled 2017-07-30: qty 1

## 2017-07-30 MED ORDER — CLOPIDOGREL BISULFATE 75 MG PO TABS
75.0000 mg | ORAL_TABLET | ORAL | Status: AC
  Administered 2017-07-30: 75 mg via ORAL

## 2017-07-30 MED ORDER — FENTANYL CITRATE (PF) 250 MCG/5ML IJ SOLN
INTRAMUSCULAR | Status: AC
  Filled 2017-07-30: qty 5

## 2017-07-30 MED ORDER — ASPIRIN EC 325 MG PO TBEC
325.0000 mg | DELAYED_RELEASE_TABLET | ORAL | Status: AC
  Administered 2017-07-30: 325 mg via ORAL
  Filled 2017-07-30: qty 1

## 2017-07-30 MED ORDER — LABETALOL HCL 5 MG/ML IV SOLN
5.0000 mg | Freq: Once | INTRAVENOUS | Status: AC
Start: 2017-07-30 — End: 2017-07-30
  Administered 2017-07-30: 5 mg via INTRAVENOUS

## 2017-07-30 MED ORDER — ACETAMINOPHEN 325 MG PO TABS
650.0000 mg | ORAL_TABLET | ORAL | Status: DC | PRN
  Administered 2017-07-31: 650 mg via ORAL
  Filled 2017-07-30: qty 2

## 2017-07-30 MED ORDER — NICARDIPINE HCL IN NACL 20-0.86 MG/200ML-% IV SOLN
0.0000 mg/h | INTRAVENOUS | Status: DC
  Administered 2017-07-30: 5 mg/h via INTRAVENOUS
  Administered 2017-07-30: 13 mg/h via INTRAVENOUS
  Administered 2017-07-30: 15 mg/h via INTRAVENOUS
  Administered 2017-07-31: 11 mg/h via INTRAVENOUS
  Administered 2017-07-31: 5 mg/h via INTRAVENOUS
  Filled 2017-07-30 (×5): qty 200

## 2017-07-30 MED ORDER — IOPAMIDOL (ISOVUE-300) INJECTION 61%
INTRAVENOUS | Status: AC
  Administered 2017-07-30: 90 mL
  Filled 2017-07-30: qty 150

## 2017-07-30 MED ORDER — HEPARIN (PORCINE) IN NACL 100-0.45 UNIT/ML-% IJ SOLN: 600.0000 [IU]/h | INTRAMUSCULAR | Status: DC

## 2017-07-30 MED ORDER — HEPARIN SODIUM (PORCINE) 1000 UNIT/ML IJ SOLN
INTRAMUSCULAR | Status: DC | PRN
  Administered 2017-07-30: 1000 [IU] via INTRAVENOUS
  Administered 2017-07-30: 3000 [IU] via INTRAVENOUS

## 2017-07-30 MED ORDER — MIDAZOLAM HCL 2 MG/2ML IJ SOLN
INTRAMUSCULAR | Status: AC
  Filled 2017-07-30: qty 2

## 2017-07-30 MED ORDER — SODIUM CHLORIDE 0.9 % IV SOLN
INTRAVENOUS | Status: DC
  Administered 2017-07-30 – 2017-07-31 (×2): via INTRAVENOUS

## 2017-07-30 MED ORDER — HYDROMORPHONE HCL 1 MG/ML IJ SOLN
INTRAMUSCULAR | Status: AC
  Administered 2017-07-30: 0.5 mg via INTRAVENOUS
  Filled 2017-07-30: qty 1

## 2017-07-30 MED ORDER — ASPIRIN 81 MG PO CHEW: 324.0000 mg | CHEWABLE_TABLET | Freq: Every day | ORAL | Status: DC

## 2017-07-30 MED ORDER — CEFAZOLIN SODIUM-DEXTROSE 2-4 GM/100ML-% IV SOLN
2.0000 g | INTRAVENOUS | Status: AC
  Administered 2017-07-30: 2 g via INTRAVENOUS

## 2017-07-30 MED ORDER — KETOROLAC TROMETHAMINE 30 MG/ML IJ SOLN
30.0000 mg | Freq: Three times a day (TID) | INTRAMUSCULAR | Status: DC | PRN
  Administered 2017-07-30 – 2017-07-31 (×2): 30 mg via INTRAVENOUS
  Filled 2017-07-30 (×3): qty 1

## 2017-07-30 MED ORDER — ACETAMINOPHEN 650 MG RE SUPP: 650.0000 mg | RECTAL | Status: DC | PRN

## 2017-07-30 MED ORDER — LABETALOL HCL 5 MG/ML IV SOLN
5.0000 mg | INTRAVENOUS | Status: AC
  Administered 2017-07-30 (×2): 5 mg via INTRAVENOUS

## 2017-07-30 MED ORDER — ROCURONIUM BROMIDE 100 MG/10ML IV SOLN
INTRAVENOUS | Status: DC | PRN
  Administered 2017-07-30: 50 mg via INTRAVENOUS
  Administered 2017-07-30 (×3): 10 mg via INTRAVENOUS

## 2017-07-30 MED ORDER — ONDANSETRON HCL 4 MG/2ML IJ SOLN
INTRAMUSCULAR | Status: DC | PRN
  Administered 2017-07-30: 4 mg via INTRAVENOUS

## 2017-07-30 MED ORDER — OXYCODONE HCL 5 MG/5ML PO SOLN: 5.0000 mg | Freq: Once | ORAL | Status: AC | PRN

## 2017-07-30 MED ORDER — ALBUTEROL SULFATE (2.5 MG/3ML) 0.083% IN NEBU: 2.5000 mg | INHALATION_SOLUTION | Freq: Four times a day (QID) | RESPIRATORY_TRACT | Status: DC | PRN

## 2017-07-30 MED ORDER — CLOPIDOGREL BISULFATE 75 MG PO TABS: 75.0000 mg | ORAL_TABLET | Freq: Every day | ORAL | Status: DC

## 2017-07-30 MED ORDER — IBUPROFEN-DIPHENHYDRAMINE HCL 200-25 MG PO CAPS: 1.0000 | ORAL_CAPSULE | Freq: Every evening | ORAL | Status: DC | PRN

## 2017-07-30 MED ORDER — ASPIRIN 325 MG PO TABS
325.0000 mg | ORAL_TABLET | Freq: Every day | ORAL | Status: DC
  Administered 2017-07-30 – 2017-08-01 (×3): 325 mg via ORAL
  Filled 2017-07-30 (×3): qty 1

## 2017-07-30 MED ORDER — NIMODIPINE 30 MG PO CAPS
0.0000 mg | ORAL_CAPSULE | ORAL | Status: AC
  Administered 2017-07-30: 60 mg via ORAL

## 2017-07-30 MED ORDER — LIDOCAINE HCL 1 % IJ SOLN
INTRAMUSCULAR | Status: AC
  Filled 2017-07-30: qty 20

## 2017-07-30 MED ORDER — HEPARIN (PORCINE) IN NACL 100-0.45 UNIT/ML-% IJ SOLN
500.0000 [IU]/h | INTRAMUSCULAR | Status: DC
  Administered 2017-07-30: 500 [IU]/h via INTRAVENOUS

## 2017-07-30 MED ORDER — HYDROMORPHONE HCL 1 MG/ML IJ SOLN
0.5000 mg | INTRAMUSCULAR | Status: AC | PRN
  Administered 2017-07-30 (×4): 0.5 mg via INTRAVENOUS

## 2017-07-30 MED ORDER — DIPHENHYDRAMINE HCL 25 MG PO CAPS
25.0000 mg | ORAL_CAPSULE | Freq: Every evening | ORAL | Status: DC | PRN
  Administered 2017-07-31 – 2017-08-01 (×2): 25 mg via ORAL
  Filled 2017-07-30 (×2): qty 1

## 2017-07-30 MED ORDER — BUTALBITAL-APAP-CAFFEINE 50-325-40 MG PO TABS
1.0000 | ORAL_TABLET | Freq: Every day | ORAL | Status: DC | PRN
  Administered 2017-07-31: 1 via ORAL
  Filled 2017-07-30: qty 1

## 2017-07-30 MED ORDER — FENTANYL CITRATE (PF) 100 MCG/2ML IJ SOLN
INTRAMUSCULAR | Status: DC | PRN
  Administered 2017-07-30 (×2): 50 ug via INTRAVENOUS

## 2017-07-30 MED ORDER — FENTANYL CITRATE (PF) 100 MCG/2ML IJ SOLN
25.0000 ug | INTRAMUSCULAR | Status: DC | PRN
  Administered 2017-07-30 (×3): 50 ug via INTRAVENOUS

## 2017-07-30 MED ORDER — OXYCODONE HCL 5 MG PO TABS
ORAL_TABLET | ORAL | Status: AC
  Filled 2017-07-30: qty 1

## 2017-07-30 MED ORDER — LABETALOL HCL 5 MG/ML IV SOLN
INTRAVENOUS | Status: AC
  Administered 2017-07-30: 5 mg via INTRAVENOUS
  Filled 2017-07-30: qty 4

## 2017-07-30 MED ORDER — LIDOCAINE HCL (CARDIAC) 20 MG/ML IV SOLN
INTRAVENOUS | Status: DC | PRN
  Administered 2017-07-30: 60 mg via INTRAVENOUS

## 2017-07-30 MED ORDER — EPTIFIBATIDE 20 MG/10ML IV SOLN
INTRAVENOUS | Status: DC | PRN
  Administered 2017-07-30 (×3): 1.5 mg

## 2017-07-30 MED ORDER — BUTALBITAL-APAP-CAFF-COD 50-325-40-30 MG PO CAPS: 1.0000 | ORAL_CAPSULE | Freq: Every day | ORAL | Status: DC | PRN

## 2017-07-30 MED ORDER — IOPAMIDOL (ISOVUE-300) INJECTION 61%
INTRAVENOUS | Status: AC
  Filled 2017-07-30: qty 50

## 2017-07-30 MED ORDER — PROMETHAZINE HCL 25 MG/ML IJ SOLN: 6.2500 mg | INTRAMUSCULAR | Status: DC | PRN

## 2017-07-30 MED ORDER — CLOPIDOGREL BISULFATE 75 MG PO TABS
ORAL_TABLET | ORAL | Status: AC
  Administered 2017-07-30: 75 mg via ORAL
  Filled 2017-07-30: qty 1

## 2017-07-30 MED ORDER — ASPIRIN EC 325 MG PO TBEC
DELAYED_RELEASE_TABLET | ORAL | Status: AC
  Administered 2017-07-30: 325 mg via ORAL
  Filled 2017-07-30: qty 1

## 2017-07-30 MED ORDER — IOPAMIDOL (ISOVUE-300) INJECTION 61%
INTRAVENOUS | Status: AC
Start: 2017-07-30 — End: 2017-07-30
  Administered 2017-07-30: 30 mL
  Filled 2017-07-30: qty 150

## 2017-07-30 MED ORDER — LABETALOL HCL 5 MG/ML IV SOLN
5.0000 mg | Freq: Once | INTRAVENOUS | Status: AC
  Administered 2017-07-30: 5 mg via INTRAVENOUS

## 2017-07-30 MED ORDER — LABETALOL HCL 5 MG/ML IV SOLN
INTRAVENOUS | Status: DC | PRN
  Administered 2017-07-30 (×2): 5 mg via INTRAVENOUS

## 2017-07-30 MED ORDER — ACETAMINOPHEN 160 MG/5ML PO SOLN: 650.0000 mg | ORAL | Status: DC | PRN

## 2017-07-30 MED ORDER — DEXAMETHASONE SODIUM PHOSPHATE 10 MG/ML IJ SOLN
INTRAMUSCULAR | Status: DC | PRN
  Administered 2017-07-30: 10 mg via INTRAVENOUS

## 2017-07-30 NOTE — Progress Notes (Signed)
Pt's PACU RN Quentin AngstJena called around 14:55 hrs stating pt was bleeding from access site from neuro intervention today.  I removed dressing noted some bleeding from arteriotomy site.  I held manual pressure using new hemostasis v pad from 1500 hrs to 1515 hrs.  Hemostasis was achieved and site was redressed using tegaderm and gauze with V-Pad remaining.

## 2017-07-30 NOTE — Progress Notes (Signed)
ANTICOAGULATION CONSULT NOTE - Initial Consult  Pharmacy Consult for Heparin Indication: Dr. Corliss Skainseveshwar heparin post procedure  Allergies  Allergen Reactions  . Banana Itching and Rash  . Fish-Derived Products Itching and Rash    Patient Measurements: Height: 5\' 4"  (162.6 cm) Weight: 182 lb (82.6 kg) IBW/kg (Calculated) : 54.7  Vital Signs: Temp: 97.7 F (36.5 C) (03/25 1310) Temp Source: Oral (03/25 0629) BP: 115/81 (03/25 1256) Pulse Rate: 95 (03/25 1345)  Labs: Recent Labs    07/30/17 0648  HGB 12.1  HCT 37.4  PLT 288  APTT 24  LABPROT 12.6  INR 0.95  CREATININE 0.83    Estimated Creatinine Clearance: 92.8 mL/min (by C-G formula based on SCr of 0.83 mg/dL).   Medical History: Past Medical History:  Diagnosis Date  . Asthma   . Brain aneurysm   . Family history of brain aneurysm   . Frequent headaches   . Hypertension   . Wears glasses     Assessment: 42 year old post embolization / Dr. Corliss Skainseveshwar procedure. Heparin to continue until 7 am  Goal of Therapy:  Heparin level 0.1-0.25 units/ml Monitor platelets by anticoagulation protocol: Yes   Plan:  Heparin at 500 units / hr 8 pm heparin level Heparin off at 7 am  Thank you Okey RegalLisa Lelaina Oatis, PharmD  07/30/2017,2:11 PM

## 2017-07-30 NOTE — Anesthesia Procedure Notes (Signed)
Arterial Line Insertion Start/End3/25/2019 8:05 AM Performed by: Jed LimerickHarder, Ranae Casebier S, CRNA, CRNA  Patient location: Pre-op. Preanesthetic checklist: patient identified, IV checked, site marked, risks and benefits discussed, surgical consent, monitors and equipment checked, pre-op evaluation, timeout performed and anesthesia consent Lidocaine 1% used for infiltration Left, radial was placed Catheter size: 20 G Hand hygiene performed  and maximum sterile barriers used   Attempts: 1 Procedure performed without using ultrasound guided technique. Following insertion, dressing applied and Biopatch. Post procedure assessment: normal and unchanged  Patient tolerated the procedure well with no immediate complications.

## 2017-07-30 NOTE — Anesthesia Procedure Notes (Signed)
Procedure Name: Intubation Date/Time: 07/30/2017 9:06 AM Performed by: Candis Shine, CRNA Pre-anesthesia Checklist: Patient identified, Emergency Drugs available, Suction available and Patient being monitored Patient Re-evaluated:Patient Re-evaluated prior to induction Oxygen Delivery Method: Circle System Utilized Preoxygenation: Pre-oxygenation with 100% oxygen Induction Type: IV induction Ventilation: Mask ventilation without difficulty Laryngoscope Size: Mac and 3 Grade View: Grade I Tube type: Oral Tube size: 7.0 mm Number of attempts: 1 Airway Equipment and Method: Stylet Placement Confirmation: ETT inserted through vocal cords under direct vision,  positive ETCO2 and breath sounds checked- equal and bilateral Secured at: 21 cm Tube secured with: Tape Dental Injury: Teeth and Oropharynx as per pre-operative assessment

## 2017-07-30 NOTE — Progress Notes (Signed)
Patient complaining of pain. On the phone with Dr. Corliss Skainseveshwar notified of bleeding from R groin site again. IR techs at beside for dressing change. Patient still c/o of pain. Verbal order for IV pain meds from Dr. Kieth BrightlyHachett. Heparin on hold for 30mins.

## 2017-07-30 NOTE — Progress Notes (Signed)
Patient ID: Anita Rhodes, female   DOB: 1976/03/02, 42 y.o.   MRN: [id number] INR. Post procedure patient extubated without difficulty. VSS. Denies anyH/As,N/V. Awakening gradually with appropriate responses and motor activity. Pupils 2.5 mm RRR directly. No  Facial asymmetry. Tongue midline. No mpronation drift of outstretched hands. RT groin soft.No hematoma or bleeding. Distal pulses Palpable bilaterally. S.Tyrone Pautsch MD

## 2017-07-30 NOTE — Progress Notes (Signed)
Pt's PACU RN Quentin AngstJena called around 1150 hrs stating pt was bleeding from access site from neuro intervention today.  I removed dressing noted some bleeding from arteriotomy site.  I held manual pressure using new hemostasis v pad from 1155 hrs tod 1205 hrs.  Hemostasis was achieved and site was redressed using tegaderm and gauze.  EchoStarCory Christella App Rt-R

## 2017-07-30 NOTE — Progress Notes (Signed)
ANTICOAGULATION CONSULT NOTE  Pharmacy Consult for Heparin Indication: Dr. Corliss Skainseveshwar heparin post procedure  Allergies  Allergen Reactions  . Banana Itching and Rash  . Fish-Derived Products Itching and Rash    Patient Measurements: Height: 5\' 4"  (162.6 cm) Weight: 182 lb (82.6 kg) IBW/kg (Calculated) : 54.7  Vital Signs: Temp: 97.7 F (36.5 C) (03/25 2005) BP: 125/68 (03/25 2130) Pulse Rate: 104 (03/25 1959)  Labs: Recent Labs    07/30/17 0648 07/30/17 2034  HGB 12.1  --   HCT 37.4  --   PLT 288  --   APTT 24  --   LABPROT 12.6  --   INR 0.95  --   HEPARINUNFRC  --  <0.10*  CREATININE 0.83  --     Estimated Creatinine Clearance: 92.8 mL/min (by C-G formula based on SCr of 0.83 mg/dL).   Assessment: 42 year old s/p right ICA aneurysm embolization on IV heparin drip. Heparin to continue until 7 am.  Heparin level is undetectable at <0.1 on 500 units/hr. Noted patient had bleeding from access site twice tonight and heparin drip was held from 16:35 to 17:05. No new CBC. Spoke with RN and no bleeding since this afternoon - will increase rate conservatively.  Goal of Therapy:  Heparin level 0.1-0.25 units/ml Monitor platelets by anticoagulation protocol: Yes   Plan:  Increase heparin drip to 600 units/hr Heparin off at 7 am   Loura BackJennifer [place], PharmD, BCPS Clinical Pharmacist Clinical phone for 07/30/2017 until 10p is [phone] After 10p, please call [facility] at [phone] for assistance 07/30/2017 10:28 PM

## 2017-07-30 NOTE — Anesthesia Preprocedure Evaluation (Signed)
Anesthesia Evaluation  Patient identified by MRN, date of birth, ID band Patient awake    Reviewed: Allergy & Precautions, NPO status , Patient's Chart, lab work & pertinent test results  Airway Mallampati: II  TM Distance: >3 FB Neck ROM: Full    Dental  (+) Dental Advisory Given   Pulmonary asthma ,    breath sounds clear to auscultation       Cardiovascular hypertension, Pt. on medications + Peripheral Vascular Disease   Rhythm:Regular Rate:Normal     Neuro/Psych  Headaches,    GI/Hepatic negative GI ROS, Neg liver ROS,   Endo/Other  negative endocrine ROS  Renal/GU negative Renal ROS     Musculoskeletal   Abdominal   Peds  Hematology negative hematology ROS (+)   Anesthesia Other Findings   Reproductive/Obstetrics                             Lab Results  Component Value Date   WBC 4.4 07/03/2017   HGB 15.0 07/03/2017   HCT 44.0 07/03/2017   MCV 88.9 07/03/2017   PLT 239 07/03/2017   Lab Results  Component Value Date   CREATININE 0.70 07/03/2017   BUN 10 07/03/2017   NA 139 07/03/2017   K 3.4 (L) 07/03/2017   CL 104 07/03/2017   CO2 22 07/03/2017    Anesthesia Physical Anesthesia Plan  ASA: III  Anesthesia Plan: General   Post-op Pain Management:    Induction: Intravenous  PONV Risk Score and Plan: 3 and Ondansetron, Dexamethasone, Treatment may vary due to age or medical condition and Midazolam  Airway Management Planned: Oral ETT  Additional Equipment: Arterial line  Intra-op Plan:   Post-operative Plan: Extubation in OR  Informed Consent: I have reviewed the patients History and Physical, chart, labs and discussed the procedure including the risks, benefits and alternatives for the proposed anesthesia with the patient or authorized representative who has indicated his/her understanding and acceptance.   Dental advisory given  Plan Discussed with:  CRNA  Anesthesia Plan Comments:         Anesthesia Quick Evaluation

## 2017-07-30 NOTE — Anesthesia Postprocedure Evaluation (Signed)
Anesthesia Post Note  Patient: Anita Rhodes  Procedure(s) Performed: EMBOLIZATION (N/A )     Patient location during evaluation: PACU Anesthesia Type: General Level of consciousness: awake and alert Pain management: pain level controlled Vital Signs Assessment: post-procedure vital signs reviewed and stable Respiratory status: spontaneous breathing, nonlabored ventilation, respiratory function stable and patient connected to nasal cannula oxygen Cardiovascular status: blood pressure returned to baseline and stable Postop Assessment: no apparent nausea or vomiting Anesthetic complications: no    Last Vitals:  Vitals:   07/30/17 1156 07/30/17 1211  BP: 119/77 114/73  Pulse: 85 85  Resp: 15 15  Temp:    SpO2: 98% 97%    Last Pain:  Vitals:   07/30/17 1222  TempSrc:   PainSc: 6                  Kennieth RadFitzgerald, Qualyn Oyervides E

## 2017-07-30 NOTE — H&P (Deleted)
  The note originally documented on this encounter has been moved the the encounter in which it belongs.  

## 2017-07-30 NOTE — Progress Notes (Signed)
Referring Physician(s): None  Supervising Physician: Julieanne Cottoneveshwar, Sanjeev  [facility]  [facility]  Chief Complaint: Right ICA aneurysm s/p embolization 07/30/2017.  Subjective:  Right ICA aneurysm s/p embolization 07/30/2017. Patient seen in PACU. Alert and responsive. Denies weakness, numbness, H/A, vision changes, or speech changes. Groin c/d/i.  Allergies: Banana and Fish-derived products  Medications: Prior to Admission medications   Medication Sig Start Date End Date Taking? Authorizing Provider  albuterol (PROVENTIL HFA;VENTOLIN HFA) 108 (90 BASE) MCG/ACT inhaler Inhale 2 puffs into the lungs every 6 (six) hours as needed for wheezing or shortness of breath.   Yes [provider]  aspirin 325 MG tablet Take 1 tablet (325 mg total) by mouth daily with breakfast. 11/02/12  Yes Colon BranchHirsch, James, MD  butalbital-acetaminophen-caffeine (FIORICET WITH CODEINE) [phone] MG per capsule Take 1 capsule by mouth daily as needed for headache.  04/02/13  Yes [provider]  clopidogrel (PLAVIX) 75 MG tablet Take 75 mg by mouth daily. 06/17/17  Yes [provider]  Ibuprofen-Diphenhydramine HCl (ADVIL PM) 200-25 MG CAPS Take 1 tablet by mouth at bedtime as needed (for sleep/headaches.).   Yes [provider]  Multiple Vitamin (MULTIVITAMIN WITH MINERALS) TABS tablet Take 1 tablet by mouth daily.   Yes [provider]  NIFEdipine (PROCARDIA XL/ADALAT-CC) 60 MG 24 hr tablet Take 90 mg by mouth daily. Medication now increased to 90mg    Yes [provider]     Vital Signs: BP 116/81   Pulse 93   Temp 97.7 F (36.5 C)   Resp 16   Ht 5\' 4"  (1.626 m)   Wt 182 lb (82.6 kg)   LMP 07/08/2017 (Approximate)   SpO2 98%   BMI 31.24 kg/m   Physical Exam  Constitutional: She is oriented to person, place, and time. She appears well-developed and well-nourished. No distress.  Cardiovascular: Normal rate, regular rhythm and normal heart  sounds.  No murmur heard. Pulmonary/Chest: Effort normal and breath sounds normal. She has no wheezes.  Neurological: She is alert and oriented to person, place, and time.  No weakness. No speech changes. PERRL. EOM intact bilaterally. No facial asymmetry. Tongue midline. No pronator drift. Gait not assessed. Motor power symmetric proportional to effort. Fine motor movements symmetric bilaterally. Distal pulses 2+ bilaterally.  Skin: Skin is warm and dry.  Right groin incision soft without hematoma or active bleeding.  Psychiatric: She has a normal mood and affect. Her behavior is normal. Judgment and thought content normal.  Nursing note and vitals reviewed.   Imaging: No results found.  Labs:  CBC: Recent Labs    05/22/17 0647 06/25/17 1139 07/03/17 1918 07/03/17 1936 07/30/17 0648  WBC 6.1 5.6 4.4  --  7.6  HGB 13.3 13.0 13.2 15.0 12.1  HCT 40.4 39.8 41.8 44.0 37.4  PLT 289 261 239  --  288    COAGS: Recent Labs    05/22/17 0647 06/25/17 1139 07/30/17 0648  INR 0.93 0.95 0.95  APTT 24 24 24     BMP: Recent Labs    05/22/17 0647 06/25/17 1139 07/03/17 1918 07/03/17 1936 07/30/17 0648  NA 137 137 136 139 135  K 3.5 3.9 3.4* 3.4* 3.6  CL 104 106 104 104 104  CO2 21* 20* 22  --  21*  GLUCOSE 105* 103* 98 94 110*  BUN 16 7 12 10 12   CALCIUM 9.9 9.7 10.1  --  9.5  CREATININE 0.89 0.72 0.77 0.70 0.83  GFRNONAA >60 >60 >60  --  >  60  GFRAA >60 >60 >60  --  >60    LIVER FUNCTION TESTS: Recent Labs    07/03/17 1918 07/30/17 0648  BILITOT 0.3 0.4  AST 31 23  ALT 36 30  ALKPHOS 85 74  PROT 8.8* 7.5  ALBUMIN 4.4 3.9    Assessment and Plan:  Right ICA aneurysm s/p embolization 07/30/2017. Patient to transfer to neuro ICU once bed becomes available. Continue neuro and BP checks. Continue Plavix 75mg  once a day and Aspirin 325mg  once a day. Groin stable. IR to follow.  Electronically Signed: Elwin Mocha, PA-C 07/30/2017, 4:20 PM   I  spent a total of 25 Minutes at the the patient's bedside AND on the patient's hospital floor or unit, greater than 50% of which was counseling/coordinating care for right ICA aneurysm.

## 2017-07-30 NOTE — Procedures (Signed)
S/P RT common and rt [facility] arteriograms followed  By endovascular treatment of x 2 blister aneurysms of RT ICA supraclinoid segment using x1 3.5275mm x 16mm pipeline flex flow diverter device

## 2017-07-30 NOTE — Transfer of Care (Signed)
Immediate Anesthesia Transfer of Care Note  Patient: Anita Rhodes  Procedure(s) Performed: EMBOLIZATION (N/A )  Patient Location: PACU  Anesthesia Type:General  Level of Consciousness: awake, alert  and oriented  Airway & Oxygen Therapy: Patient Spontanous Breathing and Patient connected to nasal cannula oxygen  Post-op Assessment: Report given to RN and Post -op Vital signs reviewed and stable  Post vital signs: Reviewed and stable  Last Vitals:  Vitals Value Taken Time  BP 121/79 07/30/2017 11:41 AM  Temp 36.4 C 07/30/2017 11:41 AM  Pulse 84 07/30/2017 11:46 AM  Resp 14 07/30/2017 11:46 AM  SpO2 99 % 07/30/2017 11:46 AM  Vitals shown include unvalidated device data.  Last Pain:  Vitals:   07/30/17 0657  TempSrc:   PainSc: 0-No pain      Patients Stated Pain Goal: 0 (07/30/17 0657)  Complications: No apparent anesthesia complications

## 2017-07-30 NOTE — H&P (Signed)
Chief Complaint: Patient was seen in consultation today for right ICA aneurysm.  Referring Physician(s): None  Supervising Physician: Julieanne Cottoneveshwar, Sanjeev  Patient Status: [facility] - Out-pt  History of Present Illness: Anita Rhodes is a 42 y.o. female  Hx saccular blister aneurysm of right ICA.  IR angio 05/22/2017: 1. A 1.82 x 1.23 mm saccular blister aneurysm arising from the posteromedial wall of the right internal carotid artery intracranially between the origin of the ophthalmic artery, and the right posterior communicating artery. 2. Wide patency of the previously positioned pipeline flow diverter device extending into the left internal carotid artery supraclinoid segment without angiographic evidence of the previously ruptured blister aneurysm arising in the left internal carotid artery supraclinoid segment.  Plan for image-guided cerebral angiogram with intervention today. Patient is on Plavix 75mg  and Aspirin 325mg . Denies H/A, weakness, syncope, dizziness, vision changes, or swallowing issues.  Past Medical History:  Diagnosis Date  . Asthma   . Brain aneurysm   . Family history of brain aneurysm   . Frequent headaches   . Hypertension   . Wears glasses     Past Surgical History:  Procedure Laterality Date  . arthroscopic knee    . IR 3D INDEPENDENT WKST  05/22/2017  . IR ANGIO INTRA EXTRACRAN SEL COM CAROTID INNOMINATE BILAT MOD SED  05/22/2017  . IR ANGIO VERTEBRAL SEL VERTEBRAL BILAT MOD SED  05/22/2017  . OTHER SURGICAL HISTORY     aneurysm repair.  Marland Kitchen. RADIOLOGY WITH ANESTHESIA N/A 06/27/2017   Procedure: RADIOLOGY WITH ANESTHESIA, EMBOLIZATION;  Surgeon: Julieanne Cottoneveshwar, Sanjeev, MD;  Location: [facility] OR;  Service: Radiology;  Laterality: N/A;    Allergies: Banana and Fish-derived products  Medications: Prior to Admission medications   Medication Sig Start Date End Date Taking? Authorizing Provider  albuterol (PROVENTIL HFA;VENTOLIN HFA) 108 (90 BASE) MCG/ACT  inhaler Inhale 2 puffs into the lungs every 6 (six) hours as needed for wheezing or shortness of breath.    [provider]  aspirin 325 MG tablet Take 1 tablet (325 mg total) by mouth daily with breakfast. 11/02/12   Colon BranchHirsch, James, MD  butalbital-acetaminophen-caffeine (FIORICET WITH CODEINE) 336485035150-325-40-30 MG per capsule Take 1 capsule by mouth daily as needed for headache.  04/02/13   [provider]  clopidogrel (PLAVIX) 75 MG tablet Take 75 mg by mouth daily. 06/17/17   [provider]  Ibuprofen-Diphenhydramine HCl (ADVIL PM) 200-25 MG CAPS Take 1 tablet by mouth at bedtime as needed (for sleep/headaches.).    [provider]  Multiple Vitamin (MULTIVITAMIN WITH MINERALS) TABS tablet Take 1 tablet by mouth daily.    [provider]  NIFEdipine (PROCARDIA XL/ADALAT-CC) 60 MG 24 hr tablet Take 90 mg by mouth daily. Medication now increased to 90mg     [provider]     Family History  Problem Relation Age of Onset  . Hyperlipidemia Father   . Aneurysm Father   . Stroke Father   . Hypertension Mother   . Hypertension Other   . Hypertension Paternal Grandfather     Social History   Socioeconomic History  . Marital status: Single    Spouse name: Not on file  . Number of children: Not on file  . Years of education: Not on file  . Highest education level: Not on file  Occupational History  . Not on file  Social Needs  . Financial resource strain: Not on file  . Food insecurity:    Worry: Not on file  Inability: Not on file  . Transportation needs:    Medical: Not on file    Non-medical: Not on file  Tobacco Use  . Smoking status: Never Smoker  . Smokeless tobacco: Never Used  Substance and Sexual Activity  . Alcohol use: Yes    Comment: social  . Drug use: No  . Sexual activity: Not on file  Lifestyle  . Physical activity:    Days per week: Not on file    Minutes per session: Not on file  . Stress: Not on file    Relationships  . Social connections:    Talks on phone: Not on file    Gets together: Not on file    Attends religious service: Not on file    Active member of club or organization: Not on file    Attends meetings of clubs or organizations: Not on file    Relationship status: Not on file  Other Topics Concern  . Not on file  Social History Narrative  . Not on file     Review of Systems: A 12 point ROS discussed and pertinent positives are indicated in the HPI above.  All other systems are negative.  Review of Systems  Constitutional: Negative for activity change and fever.  Eyes: Negative for visual disturbance.  Neurological: Negative for dizziness, syncope, speech difficulty, weakness and headaches.  Psychiatric/Behavioral: Negative for behavioral problems and confusion.    Vital Signs: LMP 07/08/2017 (Approximate)   Physical Exam  Constitutional: She is oriented to person, place, and time. She appears well-developed and well-nourished. No distress.  Cardiovascular: Normal rate, regular rhythm, normal heart sounds and intact distal pulses.  No murmur heard. Pulmonary/Chest: Effort normal and breath sounds normal. She has no wheezes.  Neurological: She is alert and oriented to person, place, and time.  Skin: Skin is warm and dry.  Psychiatric: She has a normal mood and affect. Her behavior is normal. Judgment and thought content normal.  Nursing note and vitals reviewed.    MD Evaluation Airway: WNL Heart: WNL Abdomen: WNL Chest/ Lungs: WNL ASA  Classification: 3 Mallampati/Airway Score: Two   Imaging: Ct Angio Head W Or Wo Contrast  Result Date: 07/03/2017 CLINICAL DATA:  Initial evaluation for acute neck pain with dizziness. History of aneurysm. EXAM: CT ANGIOGRAPHY HEAD AND NECK TECHNIQUE: Multidetector CT imaging of the head and neck was performed using the standard protocol during bolus administration of intravenous contrast. Multiplanar CT image  reconstructions and MIPs were obtained to evaluate the vascular anatomy. Carotid stenosis measurements (when applicable) are obtained utilizing NASCET criteria, using the distal internal carotid diameter as the denominator. CONTRAST:  ISOVUE-370 IOPAMIDOL (ISOVUE-370) INJECTION 76% COMPARISON:  Prior arteriogram from 05/22/2017. FINDINGS: Brain: Cerebral volume within normal limits for patient age. No evidence for acute intracranial hemorrhage. No findings to suggest acute large vessel territory infarct. No mass lesion, midline shift, or mass effect. Ventricles are normal in size without evidence for hydrocephalus. No extra-axial fluid collection identified. Vascular: No hyperdense vessel. Metallic stent noted at the supraclinoid left ICA. Skull: Scalp soft tissues demonstrate no acute abnormality. Calvarium intact. Sinuses/Orbits: Globes and orbital soft tissues within normal limits. Visualized paranasal sinuses are clear. No mastoid effusion. CTA NECK FINDINGS Aortic arch: Visualized aortic arch of normal caliber with normal branch pattern. No flow-limiting stenosis about the origin of the great vessels. Visualized subclavian arteries widely patent. Right carotid system: Right common and internal carotid arteries widely patent without stenosis, dissection, or occlusion. No atheromatous narrowing  about the right carotid bifurcation. Left carotid system: Left common and internal carotid arteries widely patent without stenosis, dissection, or occlusion. No atheromatous narrowing about the left carotid bifurcation. Vertebral arteries: Both of the vertebral arteries arise from the subclavian arteries and are widely patent to the skull base without stenosis, dissection, or occlusion. Skeleton: No acute osseous abnormality. No worrisome lytic or blastic osseous lesions. Other neck: No acute soft tissue abnormality within the neck. Salivary glands are normal. 9 mm left thyroid nodule noted, of doubtful clinical  significance. No adenopathy. Upper chest: Visualized upper mediastinum demonstrates no acute abnormality. Substernal extension of the thyroid on the left noted. Partially visualized lungs are clear. Mild paraseptal emphysema noted. Review of the MIP images confirms the above findings CTA HEAD FINDINGS Anterior circulation: Petrous segments patent bilaterally. Pipeline stent in place within the cavernous/supraclinoid left ICA without complication. Patent flow seen through the stent itself. Left ICA terminus widely patent. Cavernous and supraclinoid right ICA widely patent without stenosis. Tiny saccular aneurysm measuring approximately 1.5 x 2 mm seen extending from the medial aspect of the cavernous right ICA (series 11, image 235), grossly stable from previous. Right ICA terminus widely patent. A1 segments widely patent. Patent anterior communicating artery. Anterior cerebral arteries widely patent to their distal aspects without stenosis. M1 segments widely patent bilaterally. Normal MCA bifurcations. No proximal M2 occlusion. Distal MCA branches well opacified and symmetric. Posterior circulation: Vertebral arteries patent to the vertebrobasilar junction without stenosis. Posterior inferior cerebral arteries patent bilaterally. Basilar artery somewhat tortuous but widely patent to its distal aspect. Superior cerebral arteries patent bilaterally. Hypoplastic left P1 segment. PCAs widely patent to their distal aspects without stenosis. Venous sinuses: Patent. Anatomic variants: None significant. Delayed phase: No abnormal enhancement. Review of the MIP images confirms the above findings IMPRESSION: 1. Negative CTA of the head and neck. No acute vascular abnormality identified. No large vessel occlusion. No high-grade or flow-limiting stenosis. 2. Continued patency of pipeline stent position within the cavernous/supraclinoid left ICA. No recurrent aneurysm identified. 3. Stable 1.5 x 2 mm cavernous right ICA  aneurysm. Electronically Signed   By: Rise Mu M.D.   On: 07/03/2017 21:41   Ct Angio Neck W And/or Wo Contrast  Result Date: 07/03/2017 CLINICAL DATA:  Initial evaluation for acute neck pain with dizziness. History of aneurysm. EXAM: CT ANGIOGRAPHY HEAD AND NECK TECHNIQUE: Multidetector CT imaging of the head and neck was performed using the standard protocol during bolus administration of intravenous contrast. Multiplanar CT image reconstructions and MIPs were obtained to evaluate the vascular anatomy. Carotid stenosis measurements (when applicable) are obtained utilizing NASCET criteria, using the distal internal carotid diameter as the denominator. CONTRAST:  ISOVUE-370 IOPAMIDOL (ISOVUE-370) INJECTION 76% COMPARISON:  Prior arteriogram from 05/22/2017. FINDINGS: Brain: Cerebral volume within normal limits for patient age. No evidence for acute intracranial hemorrhage. No findings to suggest acute large vessel territory infarct. No mass lesion, midline shift, or mass effect. Ventricles are normal in size without evidence for hydrocephalus. No extra-axial fluid collection identified. Vascular: No hyperdense vessel. Metallic stent noted at the supraclinoid left ICA. Skull: Scalp soft tissues demonstrate no acute abnormality. Calvarium intact. Sinuses/Orbits: Globes and orbital soft tissues within normal limits. Visualized paranasal sinuses are clear. No mastoid effusion. CTA NECK FINDINGS Aortic arch: Visualized aortic arch of normal caliber with normal branch pattern. No flow-limiting stenosis about the origin of the great vessels. Visualized subclavian arteries widely patent. Right carotid system: Right common and internal carotid arteries widely patent without  stenosis, dissection, or occlusion. No atheromatous narrowing about the right carotid bifurcation. Left carotid system: Left common and internal carotid arteries widely patent without stenosis, dissection, or occlusion. No  atheromatous narrowing about the left carotid bifurcation. Vertebral arteries: Both of the vertebral arteries arise from the subclavian arteries and are widely patent to the skull base without stenosis, dissection, or occlusion. Skeleton: No acute osseous abnormality. No worrisome lytic or blastic osseous lesions. Other neck: No acute soft tissue abnormality within the neck. Salivary glands are normal. 9 mm left thyroid nodule noted, of doubtful clinical significance. No adenopathy. Upper chest: Visualized upper mediastinum demonstrates no acute abnormality. Substernal extension of the thyroid on the left noted. Partially visualized lungs are clear. Mild paraseptal emphysema noted. Review of the MIP images confirms the above findings CTA HEAD FINDINGS Anterior circulation: Petrous segments patent bilaterally. Pipeline stent in place within the cavernous/supraclinoid left ICA without complication. Patent flow seen through the stent itself. Left ICA terminus widely patent. Cavernous and supraclinoid right ICA widely patent without stenosis. Tiny saccular aneurysm measuring approximately 1.5 x 2 mm seen extending from the medial aspect of the cavernous right ICA (series 11, image 235), grossly stable from previous. Right ICA terminus widely patent. A1 segments widely patent. Patent anterior communicating artery. Anterior cerebral arteries widely patent to their distal aspects without stenosis. M1 segments widely patent bilaterally. Normal MCA bifurcations. No proximal M2 occlusion. Distal MCA branches well opacified and symmetric. Posterior circulation: Vertebral arteries patent to the vertebrobasilar junction without stenosis. Posterior inferior cerebral arteries patent bilaterally. Basilar artery somewhat tortuous but widely patent to its distal aspect. Superior cerebral arteries patent bilaterally. Hypoplastic left P1 segment. PCAs widely patent to their distal aspects without stenosis. Venous sinuses: Patent.  Anatomic variants: None significant. Delayed phase: No abnormal enhancement. Review of the MIP images confirms the above findings IMPRESSION: 1. Negative CTA of the head and neck. No acute vascular abnormality identified. No large vessel occlusion. No high-grade or flow-limiting stenosis. 2. Continued patency of pipeline stent position within the cavernous/supraclinoid left ICA. No recurrent aneurysm identified. 3. 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 Electronically Signed   By: Rise Mu M.D.   On: 07/03/2017 21:41    Labs:  CBC: Recent Labs    05/22/17 0647 06/25/17 1139 07/03/17 1918 07/03/17 1936 07/30/17 0648  WBC 6.1 5.6 4.4  --  7.6  HGB 13.3 13.0 13.2 15.0 12.1  HCT 40.4 39.8 41.8 44.0 37.4  PLT 289 261 239  --  288    COAGS: Recent Labs    05/22/17 0647 06/25/17 1139 07/30/17 0648  INR 0.93 0.95 0.95  APTT 24 24 24     BMP: Recent Labs    05/22/17 0647 06/25/17 1139 07/03/17 1918 07/03/17 1936  NA 137 137 136 139  K 3.5 3.9 3.4* 3.4*  CL 104 106 104 104  CO2 21* 20* 22  --   GLUCOSE 105* 103* 98 94  BUN 16 7 12 10   CALCIUM 9.9 9.7 10.1  --   CREATININE 0.89 0.72 0.77 0.70  GFRNONAA >60 >60 >60  --   GFRAA >60 >60 >60  --     LIVER FUNCTION TESTS: Recent Labs    07/03/17 1918  BILITOT 0.3  AST 31  ALT 36  ALKPHOS 85  PROT 8.8*  ALBUMIN 4.4    TUMOR MARKERS: No results for input(s): AFPTM, CEA, CA199, CHROMGRNA in the last 8760 hours.  Assessment and Plan:  Right ICA aneurysm. Plan  for image-guided cerebral angiogram with possible intervention today. Patient is NPO. She denies fever and WBCs WNL. INR 0.95 and P2Y12 84. She did take her Plavix 75mg  and Aspirin 325mg  today. Patient will be transferred to neuro ICU post procedure with plans for discharge tomorrow AM. Patient and husband understand and agree.   Risks and benefits of cerebral angiogram with intervention were discussed with the patient including, but not  limited to bleeding, infection, vascular injury, contrast induced renal failure, stroke or even death. This interventional procedure involves the use of X-rays and because of the nature of the planned procedure, it is possible that we will have prolonged use of X-ray fluoroscopy. Potential radiation risks to you include (but are not limited to) the following: - A slightly elevated risk for cancer  several years later in life. This risk is typically less than 0.5% percent. This risk is low in comparison to the normal incidence of human cancer, which is 33% for women and 50% for men according to the [employer]. - Radiation induced injury can include skin redness, resembling a rash, tissue breakdown / ulcers and hair loss (which can be temporary or permanent).  The likelihood of either of these occurring depends on the difficulty of the procedure and whether you are sensitive to radiation due to previous procedures, disease, or genetic conditions.  IF your procedure requires a prolonged use of radiation, you will be notified and given written instructions for further action.  It is your responsibility to monitor the irradiated area for the 2 weeks following the procedure and to notify your physician if you are concerned that you have suffered a radiation induced injury.   All of the patient's questions were answered, patient is agreeable to proceed. Consent signed and in chart.  Thank you for this interesting consult.  I greatly enjoyed meeting Anita Rhodes and look forward to participating in their care.  A copy of this report was sent to the requesting provider on this date.  Electronically Signed: Elwin Mocha, PA-C 07/30/2017, 7:50 AM   I spent a total of 30 Minutes in face to face in clinical consultation, greater than 50% of which was counseling/coordinating care for right ICA aneurysm.

## 2017-07-31 ENCOUNTER — Other Ambulatory Visit (HOSPITAL_COMMUNITY): Payer: 59

## 2017-07-31 ENCOUNTER — Inpatient Hospital Stay (HOSPITAL_COMMUNITY): Payer: 59

## 2017-07-31 DIAGNOSIS — R079 Chest pain, unspecified: Secondary | ICD-10-CM

## 2017-07-31 DIAGNOSIS — I724 Aneurysm of artery of lower extremity: Secondary | ICD-10-CM

## 2017-07-31 DIAGNOSIS — I729 Aneurysm of unspecified site: Secondary | ICD-10-CM

## 2017-07-31 DIAGNOSIS — I671 Cerebral aneurysm, nonruptured: Principal | ICD-10-CM

## 2017-07-31 DIAGNOSIS — T148XXA Other injury of unspecified body region, initial encounter: Secondary | ICD-10-CM

## 2017-07-31 LAB — CBC WITH DIFFERENTIAL/PLATELET
BASOS PCT: 0 %
Basophils Absolute: 0 10*3/uL (ref 0.0–0.1)
Eosinophils Absolute: 0 10*3/uL (ref 0.0–0.7)
Eosinophils Relative: 0 %
HCT: 33.7 % — ABNORMAL LOW (ref 36.0–46.0)
HEMOGLOBIN: 10.8 g/dL — AB (ref 12.0–15.0)
Lymphocytes Relative: 18 %
Lymphs Abs: 2.1 10*3/uL (ref 0.7–4.0)
MCH: 27.6 pg (ref 26.0–34.0)
MCHC: 32 g/dL (ref 30.0–36.0)
MCV: 86 fL (ref 78.0–100.0)
MONO ABS: 0.9 10*3/uL (ref 0.1–1.0)
MONOS PCT: 8 %
NEUTROS ABS: 8.4 10*3/uL — AB (ref 1.7–7.7)
NEUTROS PCT: 74 %
Platelets: 283 10*3/uL (ref 150–400)
RBC: 3.92 MIL/uL (ref 3.87–5.11)
RDW: 13.8 % (ref 11.5–15.5)
WBC: 11.3 10*3/uL — ABNORMAL HIGH (ref 4.0–10.5)

## 2017-07-31 LAB — BASIC METABOLIC PANEL
ANION GAP: 10 (ref 5–15)
BUN: 8 mg/dL (ref 6–20)
CALCIUM: 9.3 mg/dL (ref 8.9–10.3)
CO2: 18 mmol/L — AB (ref 22–32)
CREATININE: 0.67 mg/dL (ref 0.44–1.00)
Chloride: 107 mmol/L (ref 101–111)
Glucose, Bld: 146 mg/dL — ABNORMAL HIGH (ref 65–99)
Potassium: 3.6 mmol/L (ref 3.5–5.1)
Sodium: 135 mmol/L (ref 135–145)

## 2017-07-31 LAB — TROPONIN I

## 2017-07-31 LAB — MRSA PCR SCREENING: MRSA by PCR: NEGATIVE

## 2017-07-31 MED ORDER — SIMETHICONE 80 MG PO CHEW
80.0000 mg | CHEWABLE_TABLET | Freq: Four times a day (QID) | ORAL | Status: DC | PRN
  Administered 2017-07-31: 80 mg via ORAL
  Filled 2017-07-31: qty 1

## 2017-07-31 NOTE — Consult Note (Signed)
Name: Anita Rhodes MRN: [id number] DOB: 1975-06-21    ADMISSION DATE:  07/30/2017 CONSULTATION DATE:  07/31/2017  REFERRING MD :  Dr. Corliss Skains  CHIEF COMPLAINT:  Chest pain  HISTORY OF PRESENT ILLNESS:   42 year old female with past medical significant for but not limited to non smoker, hypertension, obesity, asthma, with prior [facility] after ruptured left ICA aneurysm s/p stent placement 10/2012 with ongoing monitoring by Dr. Corliss Skains since.  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  It appears her blood pressure has been poorly controlled at times thus intervention delayed to obtain better blood pressure control.  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  Patient was asymptotic prior to procedure and taking plavix and aspirin.    She underwent procedure with endovascular treatment of 2 blister aneurysms of right ICA.  Post-operatively she was baseline neuro status with two episodes of bleeding at groin site with hemostasis maintained with manual pressure.  She returned to the Neuro ICU for further recovery requiring cardene infusion for BP goals and subtherapeutic on heparin gtt followed by pharmacy.  Around 0230 this am, she developed dull like chest pain with Twave changes on EKG.  PCCM consulted for further assistance.   She reports epigastric/ substernal dullness, constant that has been waxing and waning with some radiation of pain into left shoulder.  The pain changes at times with deep breath but not always, and palpation seems to change the pain.  She reports she has been burping frequently today, but otherwise denies shortness of breath, nausea, vomiting, visual changes, cough, tingling/ numbness, abdominal pain or back pain.   PAST MEDICAL HISTORY :   has a past medical history of Asthma, Brain aneurysm, Family history of brain aneurysm,  Frequent headaches, Hypertension, and Wears glasses.  has a past surgical history that includes arthroscopic knee; Other surgical history; IR ANGIO INTRA EXTRACRAN SEL COM CAROTID INNOMINATE BILAT MOD SED (05/22/2017); IR 3D Independent Wkst (05/22/2017); IR ANGIO VERTEBRAL SEL VERTEBRAL BILAT MOD SED (05/22/2017); and Radiology with anesthesia (N/A, 06/27/2017). Prior to Admission medications   Medication Sig Start Date End Date Taking? Authorizing Provider  albuterol (PROVENTIL HFA;VENTOLIN HFA) 108 (90 BASE) MCG/ACT inhaler Inhale 2 puffs into the lungs every 6 (six) hours as needed for wheezing or shortness of breath.   Yes [provider]  aspirin 325 MG tablet Take 1 tablet (325 mg total) by mouth daily with breakfast. 11/02/12  Yes Colon Branch, MD  butalbital-acetaminophen-caffeine (FIORICET WITH CODEINE) [phone] MG per capsule Take 1 capsule by mouth daily as needed for headache.  04/02/13  Yes [provider]  clopidogrel (PLAVIX) 75 MG tablet Take 75 mg by mouth daily. 06/17/17  Yes [provider]  Ibuprofen-Diphenhydramine HCl (ADVIL PM) 200-25 MG CAPS Take 1 tablet by mouth at bedtime as needed (for sleep/headaches.).   Yes [provider]  Multiple Vitamin (MULTIVITAMIN WITH MINERALS) TABS tablet Take 1 tablet by mouth daily.   Yes [provider]  NIFEdipine (PROCARDIA XL/ADALAT-CC) 60 MG 24 hr tablet Take 90 mg by mouth daily. Medication now increased to 90mg    Yes [provider]   Allergies  Allergen Reactions  . Banana Itching and Rash  . Fish-Derived Products Itching and Rash    FAMILY HISTORY:  family history includes Aneurysm in her father; Hyperlipidemia in her father;  Hypertension in her mother, other, and paternal grandfather; Stroke in her father. SOCIAL HISTORY:  reports that she has never smoked. She has never used smokeless tobacco. She reports that she drinks alcohol. She reports that she does not use  drugs.  REVIEW OF SYSTEMS:  POSITIVES IN BOLD Gen: Denies fever, chills, weight change, fatigue HEENT: Denies blurred vision, double vision, hearing loss,  PULM: Denies shortness of breath, cough, sputum production, hemoptysis, wheezing CV: Denies chest pain, edema, orthopnea, palpitations GI: Denies abdominal pain, nausea, vomiting, diarrhea,  Neuro: Denies headache, numbness, weakness, slurred speech  SUBJECTIVE:  Pain currently at a 5/10, but able to rest and intermittently fall asleep  VITAL SIGNS: Temp:  [97.5 F (36.4 C)-98.8 F (37.1 C)] 98.8 F (37.1 C) (03/26 0000) Pulse Rate:  [83-117] 108 (03/26 0200) Resp:  [12-24] 20 (03/26 0200) BP: (111-141)/(64-92) 124/72 (03/26 0200) SpO2:  [95 %-100 %] 99 % (03/26 0200) Arterial Line BP: (124-159)/(57-85) 127/61 (03/26 0200) Weight:  [182 lb (82.6 kg)] 182 lb (82.6 kg) (03/25 0657)  PHYSICAL EXAMINATION: General:  Adult AA female lying in bed in NAD HEENT: MM pink/moist, pupils 4/ reactive, sclerae anicertic Neuro: Alert, oriented, non-focal CV: ST 101, RRR, no m/r/g, right groin site soft, with bulky dry gauze dressing PULM: even/non-labored, lungs bilaterally clear, 100% on room air, normal respirations, no wheezing GI: obese, soft, non-tender, bs active  Extremities: warm/dry, no edema  Skin: no rashes    Recent Labs  Lab 07/30/17 0648  NA 135  K 3.6  CL 104  CO2 21*  BUN 12  CREATININE 0.83  GLUCOSE 110*   Recent Labs  Lab 07/30/17 0648  HGB 12.1  HCT 37.4  WBC 7.6  PLT 288   No results found.    SIGNIFICANT EVENTS: 3/25 Admitted   LINES/TUBES: PIV x 2  3/25 Left radial aline >>  DISCUSSION: 50 yoF with PMH of HTN, asthma, and aneurysms admitted 3/25 for elective cerebral angiogram with intervention.  S/p endovascular repair of 2 blister aneurysms of Right ICA with stent. Returned to ICU on cardene for bp control and heparin gtt.  Developed chest pain with nonspecific twave changes.  PCCM asked  to consult.   ASSESSMENT / PLAN:  Chest pain- with history of poorly controlled HTN - C/o of substernal/ epigastric dull like pain/pressure, constant for last 30 mins, varying in intensity, with radiation into left shoulder with changes in quality of pain at times with deep breathing/ palpation/ movement. - Patient reports frequent belching today, without n/v or SOB.  Remains on room air with no recent changes in her hemodynamics.  DDx include cardiac origin vs ischemia vs gas with diaphragmatic irration with referred pain to shoulder vs less likely PE - initial EKG with baseline wonder in V4/ V5 and possible LVH P:  Continue tele monitoring  Repeated EKG showing ST at 101, normal axis, nonspecific Twave abnormality and is unchanged from prior EKG in 06/2017, no emergent indications at this time.  Trend troponin x 3, and repeat EKG in 6 hours Stat CBC to assess for potential blood loss which could contribute to demand ischemia.   Bmet pending to assess electrolytes  Continue asa, plavix, and lowdose heparin gtt as below Continue cardene IV for SBP 120-140 per Deveshwar Consider Cardiology consult in am and baseline Echo given hx of longstanding hypertension simethicone prn to help with belching  Blister aneurysms of Right ICA x 2 s/p endovascular repair with stent P:  Per Interventional Vascular Service  To remain on heparin gtt, asa, plavix, nimodipine Frequent neuro exams Prn toradol and oxy IR   Remainder per primary team.    Patient updated on plan of care.    Posey BoyerBrooke Jodeen Mclin, AGACNP-BC [facility] Pgr: [phone] or if no answer [phone] 07/31/2017, 4:25 AM

## 2017-07-31 NOTE — Discharge Summary (Signed)
Patient ID: Anita Rhodes MRN: [id number] DOB/AGE: 42/20/1977 42 y.o.  Admit date: 07/30/2017 Discharge date: 07/31/2017  Supervising Physician: Anita Rhodes  Patient Status: [name] - In-pt  Admission Diagnoses: Brain aneurysm  Discharge Diagnoses:  Active Problems:   Brain aneurysm   Acute chest pain  Discharged Condition: stable  Hospital Course:  Patient scheduled for image-guided cerebral angiogram with right ICA blister aneurysm embolization 07/30/2017. Patient tolerated procedure well without major complications and was transferred to neuro ICU for overnight observation.   Patient complained of right groin incision tenderness 07/31/2017. Doppler ultrasound 07/31/2017 *Preliminary results*: 1. The right common femoral and superficial femoral arteries are patent with no evidence of pseudoaneurysm or arteriovenous fistula. No evidence of deep vein thrombosis involving the right common femoral vein.  Patient developed onset of chest pressure early morning 07/31/2017. 12 lead EKG demonstrated ST abnormalities. Cardiac enzymes negative x2. Cardiology consulted, recommended echocardiogram to check for wall motion abnormality. Spoke with cardiology, state that if echo is normal, patient is approved for discharge with outpatient follow-up. Nurse aware of discharge plan based on results of echo.  Patient alert resting in chair. Vital signs stable. Patient states her chest pressure is gone and she is asymptomatic at this time. Continue taking Plavix 75mg  once a day and Aspirin 325mg  once a day. Follow-up with Dr. Corliss Skains in 3 weeks, schedulers will call patient to set up appointment.  Consults: cardiology  Discharge Exam: Blood pressure 135/74, pulse 97, temperature 98.7 F (37.1 C), temperature source Oral, resp. rate 17, height 5\' 4"  (1.626 m), weight 182 lb (82.6 kg), last menstrual period 07/08/2017, SpO2 100 %. Physical Exam  Constitutional: She is oriented to  person, place, and time. She appears well-developed and well-nourished. No distress.  Cardiovascular: Normal rate, regular rhythm and normal heart sounds.  No murmur heard. Pulmonary/Chest: Effort normal and breath sounds normal. She has no wheezes.  Neurological: She is alert and oriented to person, place, and time.  No weakness. No speech changes. PERRL. EOMs intact bilaterally. No facial asymmetry. Tongue midline. No pronator drift. Gait not assessed. Motor power symmetric proportional to effort. Fine motor movements intact and symmetric bilaterally. Distal pulses 2+ bilaterally.   Skin: Skin is warm and dry.  Right groin incision soft with mild tenderness, no hematoma or active bleeding.  Psychiatric: She has a normal mood and affect. Her behavior is normal. Judgment and thought content normal.  Nursing note and vitals reviewed.   Disposition: Discharge disposition: 01-Home or Self Care       Discharge Instructions    Call MD for:  difficulty breathing, headache or visual disturbances   Complete by:  As directed    Call MD for:  extreme fatigue   Complete by:  As directed    Call MD for:  hives   Complete by:  As directed    Call MD for:  persistant dizziness or light-headedness   Complete by:  As directed    Call MD for:  persistant nausea and vomiting   Complete by:  As directed    Call MD for:  redness, tenderness, or signs of infection (pain, swelling, redness, odor or green/yellow discharge around incision site)   Complete by:  As directed    Call MD for:  severe uncontrolled pain   Complete by:  As directed    Call MD for:  temperature >100.4   Complete by:  As directed    Diet - low sodium heart healthy   Complete by:  As directed    Driving Restrictions   Complete by:  As directed    Do not drive self for 2 weeks.   Increase activity slowly   Complete by:  As directed    Lifting restrictions   Complete by:  As directed    Do not lift more than 10  pounds for 2 weeks.     Allergies as of 07/31/2017      Reactions   Banana Itching, Rash   Fish-derived Products Itching, Rash      Medication List    TAKE these medications   ADVIL PM 200-25 MG Caps Generic drug:  Ibuprofen-diphenhydrAMINE HCl Take 1 tablet by mouth at bedtime as needed (for sleep/headaches.).   albuterol 108 (90 Base) MCG/ACT inhaler Commonly known as:  PROVENTIL HFA;VENTOLIN HFA Inhale 2 puffs into the lungs every 6 (six) hours as needed for wheezing or shortness of breath.   aspirin 325 MG tablet Take 1 tablet (325 mg total) by mouth daily with breakfast.   butalbital-acetaminophen-caffeine 50-325-40-30 MG capsule Commonly known as:  FIORICET WITH CODEINE Take 1 capsule by mouth daily as needed for headache.   clopidogrel 75 MG tablet Commonly known as:  PLAVIX Take 75 mg by mouth daily.   multivitamin with minerals Tabs tablet Take 1 tablet by mouth daily.   NIFEdipine 60 MG 24 hr tablet Commonly known as:  PROCARDIA XL/ADALAT-CC Take 90 mg by mouth daily. Medication now increased to 90mg       Follow-up Information    Anita Cottoneveshwar, Sanjeev, MD Follow up in 3 week(s).   Specialties:  Interventional Radiology, Radiology Why:  Schedulers will call you to schedule follow-up in 3 weeks after discharge. Contact information: [address] [place] [address] [phone]            Electronically Signed: Elwin MochaAlexandra Louk, PA-C 07/31/2017, 4:46 PM   I have spent Greater Than 30 Minutes discharging Anita Rhodes.

## 2017-07-31 NOTE — Progress Notes (Signed)
Pt informed this RN of chest pain. 12 lead EKG obtained and Dr. Corliss Skainseveshwar notified. MD is to consult CCM for further orders. Vital signs stable, no signs of distress. Will continue to monitor.

## 2017-07-31 NOTE — Consult Note (Addendum)
The patient has been seen in conjunction with Laverda Page, [facility] . All aspects of care have been considered and discussed. The patient has been personally interviewed, examined, and all clinical data has been reviewed.   Symptoms are atypical for myocardial ischemia.  No objective evidence of ischemia.  Plan to cycle cardiac markers and do further ischemic evaluation if they become abnormal or EKG shows evidence of ischemia.  Otherwise, if patient is able to ambulate without symptoms approach will be conservative.  Cardiology Consult    Patient ID: THURZA KWIECINSKI MRN: [id number], DOB/AGE: 09-25-1975   Admit date: 07/30/2017 Date of Consult: 07/31/2017  Primary Physician: Patient, No Pcp Per Primary Cardiologist: New Requesting Provider: Dr. Corliss Skains Reason for Consultation: Chest pain  BEVERLIE KURIHARA is a 42 y.o. female who is being seen today for the evaluation of chest pain at the request of Dr. Corliss Skains.  Patient Profile    42 yo female with PMH of HTN, obesity, asthma, and prior SAH with ruptured left ICA s/p stent placement in 6/14. 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   Past Medical History   Past Medical History:  Diagnosis Date  . Asthma   . Brain aneurysm   . Family history of brain aneurysm   . Frequent headaches   . Hypertension   . Wears glasses     Past Surgical History:  Procedure Laterality Date  . arthroscopic knee    . IR 3D INDEPENDENT WKST  05/22/2017  . IR ANGIO INTRA EXTRACRAN SEL COM CAROTID INNOMINATE BILAT MOD SED  05/22/2017  . IR ANGIO VERTEBRAL SEL VERTEBRAL BILAT MOD SED  05/22/2017  . OTHER SURGICAL HISTORY     aneurysm repair.  Marland Kitchen RADIOLOGY WITH ANESTHESIA N/A 06/27/2017   Procedure: RADIOLOGY WITH ANESTHESIA, EMBOLIZATION;  Surgeon: Julieanne Cotton, MD;  Location: [facility] OR;  Service: Radiology;  Laterality: N/A;      Allergies  Allergies  Allergen Reactions  . Banana Itching and Rash  . Fish-Derived Products Itching and Rash    History of Present Illness    Mrs. 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 Reports being followed by Dr. Corliss Skains over the past several years since her first aneurysm. Has been on full strength ASA since that time. Had a CTA on 1/19 that showed a new blister aneurysm of the right ICA. Has noted trouble controlling her blood pressures at home as well. Was placed Plavix about a week ago in prep for surgery. Presented on 3/25 for elective cerebral angiogram with intervention and reported being in her usual state of health prior to surgery.   Underwent surgery with treatment of 2 blister aneurysms of the right ICA. Post op was doing well. Did have issues with rebleeding in the groin resolving with manual pressure. Blood pressures were noted to be elevated and she was placed on cardene. Reports she was watching TV around 2am this morning when she developed a sudden onset of left sided chest pressure/heaviness with radiation into the left shoulder. Called the RN. Reports she was given some pain medication. EKG done noted SR with nonspecific T wave changes as noted on previous EKG back in 2/19. Chest pressure seemed to be worse with inspiration and somewhat tender to palpation. Symptoms have lingered throughout the morning. Trop cycled and  neg x2 thus far. Follow up EKG later this morning without ischemic changes. Of note, possible family hx of heart disease with her mother but she is unsure of what.   Inpatient Medications    . aspirin  325 mg Oral Daily   Or  . aspirin  324 mg Per Tube Daily  . clopidogrel  75 mg Oral Daily   Or  . clopidogrel  75 mg Per Tube Daily    Family History    Family History  Problem Relation Age of Onset  . Hyperlipidemia Father   . Aneurysm Father   .  Stroke Father   . Hypertension Mother   . Hypertension Other   . Hypertension Paternal Grandfather     Social History    Social History   Socioeconomic History  . Marital status: Single    Spouse name: Not on file  . Number of children: Not on file  . Years of education: Not on file  . Highest education level: Not on file  Occupational History  . Not on file  Social Needs  . Financial resource strain: Not on file  . Food insecurity:    Worry: Not on file    Inability: Not on file  . Transportation needs:    Medical: Not on file    Non-medical: Not on file  Tobacco Use  . Smoking status: Never Smoker  . Smokeless tobacco: Never Used  Substance and Sexual Activity  . Alcohol use: Yes    Comment: social  . Drug use: No  . Sexual activity: Not on file  Lifestyle  . Physical activity:    Days per week: Not on file    Minutes per session: Not on file  . Stress: Not on file  Relationships  . Social connections:    Talks on phone: Not on file    Gets together: Not on file    Attends religious service: Not on file    Active member of club or organization: Not on file    Attends meetings of clubs or organizations: Not on file    Relationship status: Not on file  . Intimate partner violence:    Fear of current or ex partner: Not on file    Emotionally abused: Not on file    Physically abused: Not on file    Forced sexual activity: Not on file  Other Topics Concern  . Not on file  Social History Narrative  . Not on file     Review of Systems    See HPI  All other systems reviewed and are otherwise negative except as noted above.  Physical Exam    Blood pressure 125/84, pulse 87, temperature 99 F (37.2 C), temperature source Oral, resp. rate 16, height 5\' 4"  (1.626 m), weight 182 lb (82.6 kg), last menstrual period 07/08/2017, SpO2 100 %.  General: Pleasant, young AAF NAD Psych: Normal affect. Neuro: Alert and oriented X 3. Moves all extremities  spontaneously. HEENT: Normal  Neck: Supple, no JVD. Lungs:  Resp regular and unlabored, CTA. Heart: RRR no s3, s4, or murmurs. Abdomen: Soft, non-tender, non-distended, BS + x 4.  Extremities: No clubbing, cyanosis or edema. DP/PT/Radials 2+ and equal bilaterally.  Labs    Troponin (Point of Care Test) No results for input(s): TROPIPOC in the last 72 hours. Recent Labs    07/31/17 0322 07/31/17 1026  TROPONINI <0.03 <0.03   Lab Results  Component Value Date   WBC 11.3 (H) 07/31/2017  HGB 10.8 (L) 07/31/2017   HCT 33.7 (L) 07/31/2017   MCV 86.0 07/31/2017   PLT 283 07/31/2017    Recent Labs  Lab 07/30/17 0648 07/31/17 0322  NA 135 135  K 3.6 3.6  CL 104 107  CO2 21* 18*  BUN 12 8  CREATININE 0.83 0.67  CALCIUM 9.5 9.3  PROT 7.5  --   BILITOT 0.4  --   ALKPHOS 74  --   ALT 30  --   AST 23  --   GLUCOSE 110* 146*   No results found for: CHOL, HDL, LDLCALC, TRIG No results found for: [facility]   Radiology Studies    Ct Angio Head W Or Wo Contrast  Result Date: 07/03/2017 CLINICAL DATA:  Initial evaluation for acute neck pain with dizziness. History of aneurysm. EXAM: CT ANGIOGRAPHY HEAD AND NECK TECHNIQUE: Multidetector CT imaging of the head and neck was performed using the standard protocol during bolus administration of intravenous contrast. Multiplanar CT image reconstructions and MIPs were obtained to evaluate the vascular anatomy. Carotid stenosis measurements (when applicable) are obtained utilizing NASCET criteria, using the distal internal carotid diameter as the denominator. CONTRAST:  ISOVUE-370 IOPAMIDOL (ISOVUE-370) INJECTION 76% COMPARISON:  Prior arteriogram from 05/22/2017. FINDINGS: Brain: Cerebral volume within normal limits for patient age. No evidence for acute intracranial hemorrhage. No findings to suggest acute large vessel territory infarct. No mass lesion, midline shift, or mass effect. Ventricles are normal in size without evidence for  hydrocephalus. No extra-axial fluid collection identified. Vascular: No hyperdense vessel. Metallic stent noted at the supraclinoid left ICA. Skull: Scalp soft tissues demonstrate no acute abnormality. Calvarium intact. Sinuses/Orbits: Globes and orbital soft tissues within normal limits. Visualized paranasal sinuses are clear. No mastoid effusion. CTA NECK FINDINGS Aortic arch: Visualized aortic arch of normal caliber with normal branch pattern. No flow-limiting stenosis about the origin of the great vessels. Visualized subclavian arteries widely patent. Right carotid system: Right common and internal carotid arteries widely patent without stenosis, dissection, or occlusion. No atheromatous narrowing about the right carotid bifurcation. Left carotid system: Left common and internal carotid arteries widely patent without stenosis, dissection, or occlusion. No atheromatous narrowing about the left carotid bifurcation. Vertebral arteries: Both of the vertebral arteries arise from the subclavian arteries and are widely patent to the skull base without stenosis, dissection, or occlusion. Skeleton: No acute osseous abnormality. No worrisome lytic or blastic osseous lesions. Other neck: No acute soft tissue abnormality within the neck. Salivary glands are normal. 9 mm left thyroid nodule noted, of doubtful clinical significance. No adenopathy. Upper chest: Visualized upper mediastinum demonstrates no acute abnormality. Substernal extension of the thyroid on the left noted. Partially visualized lungs are clear. Mild paraseptal emphysema noted. Review of the MIP images confirms the above findings CTA HEAD FINDINGS Anterior circulation: Petrous segments patent bilaterally. Pipeline stent in place within the cavernous/supraclinoid left ICA without complication. Patent flow seen through the stent itself. Left ICA terminus widely patent. Cavernous and supraclinoid right ICA widely patent without stenosis. Tiny saccular aneurysm  measuring approximately 1.5 x 2 mm seen extending from the medial aspect of the cavernous right ICA (series 11, image 235), grossly stable from previous. Right ICA terminus widely patent. A1 segments widely patent. Patent anterior communicating artery. Anterior cerebral arteries widely patent to their distal aspects without stenosis. M1 segments widely patent bilaterally. Normal MCA bifurcations. No proximal M2 occlusion. Distal MCA branches well opacified and symmetric. Posterior circulation: Vertebral arteries patent to the vertebrobasilar junction without  stenosis. Posterior inferior cerebral arteries patent bilaterally. Basilar artery somewhat tortuous but widely patent to its distal aspect. Superior cerebral arteries patent bilaterally. Hypoplastic left P1 segment. PCAs widely patent to their distal aspects without stenosis. Venous sinuses: Patent. Anatomic variants: None significant. Delayed phase: No abnormal enhancement. Review of the MIP images confirms the above findings IMPRESSION: 1. Negative CTA of the head and neck. No acute vascular abnormality identified. No large vessel occlusion. No high-grade or flow-limiting stenosis. 2. Continued patency of pipeline stent position within the cavernous/supraclinoid left ICA. No recurrent aneurysm identified. 3. 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 Electronically Signed   By: Rise MuBenjamin  McClintock M.D.   On: 07/03/2017 21:41   Ct Angio Neck W And/or Wo Contrast  Result Date: 07/03/2017 CLINICAL DATA:  Initial evaluation for acute neck pain with dizziness. History of aneurysm. EXAM: CT ANGIOGRAPHY HEAD AND NECK TECHNIQUE: Multidetector CT imaging of the head and neck was performed using the standard protocol during bolus administration of intravenous contrast. Multiplanar CT image reconstructions and MIPs were obtained to evaluate the vascular anatomy. Carotid stenosis measurements (when applicable) are obtained utilizing NASCET criteria, using the  distal internal carotid diameter as the denominator. CONTRAST:  100mL ISOVUE-370 IOPAMIDOL (ISOVUE-370) INJECTION 76% COMPARISON:  Prior arteriogram from 05/22/2017. FINDINGS: Brain: Cerebral volume within normal limits for patient age. No evidence for acute intracranial hemorrhage. No findings to suggest acute large vessel territory infarct. No mass lesion, midline shift, or mass effect. Ventricles are normal in size without evidence for hydrocephalus. No extra-axial fluid collection identified. Vascular: No hyperdense vessel. Metallic stent noted at the supraclinoid left ICA. Skull: Scalp soft tissues demonstrate no acute abnormality. Calvarium intact. Sinuses/Orbits: Globes and orbital soft tissues within normal limits. Visualized paranasal sinuses are clear. No mastoid effusion. CTA NECK FINDINGS Aortic arch: Visualized aortic arch of normal caliber with normal branch pattern. No flow-limiting stenosis about the origin of the great vessels. Visualized subclavian arteries widely patent. Right carotid system: Right common and internal carotid arteries widely patent without stenosis, dissection, or occlusion. No atheromatous narrowing about the right carotid bifurcation. Left carotid system: Left common and internal carotid arteries widely patent without stenosis, dissection, or occlusion. No atheromatous narrowing about the left carotid bifurcation. Vertebral arteries: Both of the vertebral arteries arise from the subclavian arteries and are widely patent to the skull base without stenosis, dissection, or occlusion. Skeleton: No acute osseous abnormality. No worrisome lytic or blastic osseous lesions. Other neck: No acute soft tissue abnormality within the neck. Salivary glands are normal. 9 mm left thyroid nodule noted, of doubtful clinical significance. No adenopathy. Upper chest: Visualized upper mediastinum demonstrates no acute abnormality. Substernal extension of the thyroid on the left noted. Partially  visualized lungs are clear. Mild paraseptal emphysema noted. Review of the MIP images confirms the above findings CTA HEAD FINDINGS Anterior circulation: Petrous segments patent bilaterally. Pipeline stent in place within the cavernous/supraclinoid left ICA without complication. Patent flow seen through the stent itself. Left ICA terminus widely patent. Cavernous and supraclinoid right ICA widely patent without stenosis. Tiny saccular aneurysm measuring approximately 1.5 x 2 mm seen extending from the medial aspect of the cavernous right ICA (series 11, image 235), grossly stable from previous. Right ICA terminus widely patent. A1 segments widely patent. Patent anterior communicating artery. Anterior cerebral arteries widely patent to their distal aspects without stenosis. M1 segments widely patent bilaterally. Normal MCA bifurcations. No proximal M2 occlusion. Distal MCA branches well opacified and symmetric. Posterior circulation: Vertebral  arteries patent to the vertebrobasilar junction without stenosis. Posterior inferior cerebral arteries patent bilaterally. Basilar artery somewhat tortuous but widely patent to its distal aspect. Superior cerebral arteries patent bilaterally. Hypoplastic left P1 segment. PCAs widely patent to their distal aspects without stenosis. Venous sinuses: Patent. Anatomic variants: None significant. Delayed phase: No abnormal enhancement. Review of the MIP images confirms the above findings IMPRESSION: 1. Negative CTA of the head and neck. No acute vascular abnormality identified. No large vessel occlusion. No high-grade or flow-limiting stenosis. 2. Continued patency of pipeline stent position within the cavernous/supraclinoid left ICA. No recurrent aneurysm identified. 3. 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 Electronically Signed   By: Rise Mu M.D.   On: 07/03/2017 21:41    ECG & Cardiac Imaging    EKG:  The EKG was personally reviewed and demonstrates SR  with nonspecific T wave changes similar to previous EKGs.  Assessment & Plan    42 yo female with PMH of HTN, obesity, asthma, and prior SAH with ruptured left ICA s/p stent placement in 6/14. 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   1. Chest pressure: Reports onset of chest pressure around 2-2:30am while laying in bed watching TV. Worse with inspiration and palpation. EKG non acute. Trop neg x2 thus far. Still with mild lingering pain but seems to be somewhat reproducible to palpation on exam. Does have RFs of HTN that seems to have been uncontrolled prior to admission.  -- check echo for WMA  2. Right blister aneurysm s/p embolization: on 325mg  ASA and plavix 75mg  daily. Having some groin site tenderness with plans for groin [place] today. Plan per primary  3. HTN: Was on cardene drip post procedure but has now been weaned. On nifedipine 60mg  as outpatient.   Janice Coffin, NP-C Pager [phone] 07/31/2017, 12:41 PM

## 2017-07-31 NOTE — Progress Notes (Signed)
Referring Physician(s): None  Supervising Physician: Julieanne Cotton  Patient Status:  [facility] - In-pt  Chief Complaint:   Right ICA aneurysm s/p embolization 07/30/2017. Chest pain. Headache.  Subjective:  Right ICA aneurysm s/p embolization 07/30/2017. Alert and responsive. Groin c/d/i with mild tenderness. Complains of H/A, rated 4-5/10. Denies weakness, numbness/tingling, syncope, vision changes, or speech changes.  Complains of chest pain since last evening, now rated 7/10. EKG 07/31/2017 at 02:34 demonstrated ST abnormalities. Describes pain as pressure on chest. Hx GERD, states "this feeling is different". Denies shortness of breath.  Allergies: Banana and Fish-derived products  Medications: Prior to Admission medications   Medication Sig Start Date End Date Taking? Authorizing Provider  albuterol (PROVENTIL HFA;VENTOLIN HFA) 108 (90 BASE) MCG/ACT inhaler Inhale 2 puffs into the lungs every 6 (six) hours as needed for wheezing or shortness of breath.   Yes [provider]  aspirin 325 MG tablet Take 1 tablet (325 mg total) by mouth daily with breakfast. 11/02/12  Yes Colon Branch, MD  butalbital-acetaminophen-caffeine (FIORICET WITH CODEINE) [phone] MG per capsule Take 1 capsule by mouth daily as needed for headache.  04/02/13  Yes [provider]  clopidogrel (PLAVIX) 75 MG tablet Take 75 mg by mouth daily. 06/17/17  Yes [provider]  Ibuprofen-Diphenhydramine HCl (ADVIL PM) 200-25 MG CAPS Take 1 tablet by mouth at bedtime as needed (for sleep/headaches.).   Yes [provider]  Multiple Vitamin (MULTIVITAMIN WITH MINERALS) TABS tablet Take 1 tablet by mouth daily.   Yes [provider]  NIFEdipine (PROCARDIA XL/ADALAT-CC) 60 MG 24 hr tablet Take 90 mg by mouth daily. Medication now increased to 90mg    Yes [provider]     Vital Signs: BP 130/78   Pulse (!) 103   Temp 99.2 F (37.3 C) (Oral)    Resp 19   Ht 5\' 4"  (1.626 m)   Wt 182 lb (82.6 kg)   LMP 07/08/2017 (Approximate)   SpO2 100%   BMI 31.24 kg/m   Physical Exam  Constitutional: She is oriented to person, place, and time. She appears well-developed and well-nourished. No distress.  Cardiovascular: Normal rate, regular rhythm and normal heart sounds.  No murmur heard. Pulmonary/Chest: Effort normal and breath sounds normal. She has no wheezes.  Neurological: She is alert and oriented to person, place, and time.  No weakness. No speech changes. PERRL. EOMs intact bilaterally. No facial asymmetry. Tongue midline. No pronator drift. Gait not assessed. Motor power symmetric proportional to effort. Fine motor movements intact and symmetric bilaterally. Distal pulses 2+ bilaterally.  Skin: Skin is warm and dry.  Right groin soft with mild tenderness, no hematoma or active bleeding.  Psychiatric: She has a normal mood and affect. Her behavior is normal. Judgment and thought content normal.  Nursing note and vitals reviewed.   Imaging: No results found.  Labs:  CBC: Recent Labs    06/25/17 1139 07/03/17 1918 07/03/17 1936 07/30/17 0648 07/31/17 0322  WBC 5.6 4.4  --  7.6 11.3*  HGB 13.0 13.2 15.0 12.1 10.8*  HCT 39.8 41.8 44.0 37.4 33.7*  PLT 261 239  --  288 283    COAGS: Recent Labs    05/22/17 0647 06/25/17 1139 07/30/17 0648  INR 0.93 0.95 0.95  APTT 24 24 24     BMP: Recent Labs    06/25/17 1139 07/03/17 1918 07/03/17 1936 07/30/17 0648 07/31/17 0322  NA 137 136 139 135 135  K 3.9 3.4* 3.4* 3.6 3.6  CL 106 104 104 104 107  CO2 20* 22  --  21* 18*  GLUCOSE 103* 98 94 110* 146*  BUN 7 12 10 12 8   CALCIUM 9.7 10.1  --  9.5 9.3  CREATININE 0.72 0.77 0.70 0.83 0.67  GFRNONAA >60 >60  --  >60 >60  GFRAA >60 >60  --  >60 >60    LIVER FUNCTION TESTS: Recent Labs    07/03/17 1918 07/30/17 0648  BILITOT 0.3 0.4  AST 31 23  ALT 36 30  ALKPHOS 85 74  PROT 8.8* 7.5  ALBUMIN 4.4  3.9    Assessment and Plan:  Right ICA aneurysm s/p embolization 07/30/2017. Patient recovering well. Informed nurse to remove foley and arterial line. Continue Plavix 75mg  once a day and Aspirin 325mg  once a day. Groin stable. If tenderness continues, will order ultrasound. IR to follow.  Chest pain. Cardiology consult sent. Patient to remain NPO and on bedrest until consult complete.  Headache. Continue Toradol 30mg  IV every 8 hours as needed for pain.  Electronically Signed: Elwin MochaAlexandra Kore Madlock, PA-C 07/31/2017, 9:24 AM   I spent a total of 20 minutes at the the patient's bedside AND on the patient's hospital floor or unit, greater than 50% of which was counseling/coordinating care for right ICA aneurysm s/p embolization AND chest pain AND headache.

## 2017-07-31 NOTE — Progress Notes (Signed)
Paged on call cardiology fellow in regards to pending echo results for discharge. Was told that the echo would probably not be read until the morning d/t echo being done so late in the evening.

## 2017-07-31 NOTE — Discharge Instructions (Signed)
Endovascular Therapy for Cerebral Aneurysm, Care After °This sheet gives you information about how to care for yourself after your procedure. Your health care provider may also give you more specific instructions. If you have problems or questions, contact your health care provider. °What can I expect after the procedure? °After the procedure, it is common to have: °· Pain, tenderness, and swelling around your incision. °· Headaches. ° °Follow these instructions at home: °Medicines °· Take over-the-counter and prescription medicines only as told by your health care provider. °· If you were prescribed medicines to prevent blood clots (antiplatelet medicines), talk with your health care provider about the risks. These medicines can increase bleeding, so you may need to avoid certain activities. °Incision care °· Follow instructions from your health care provider about how to take care of your incision. Make sure you: °? Wash your hands with soap and water before you change your bandage (dressing). If soap and water are not available, use hand sanitizer. °? Change your dressing as told by your health care provider. °? Leave stitches (sutures), skin glue, or adhesive strips in place. These skin closures may need to stay in place for 2 weeks or longer. If adhesive strip edges start to loosen and curl up, you may trim the loose edges. Do not remove adhesive strips completely unless your health care provider tells you to do that. °· Check your incision area every day for signs of infection. Check for: °? Redness, swelling, or pain. °? Fluid or blood. °? Warmth. °? Pus or a bad smell. °Activity °· Ask your health care provider what activities are safe for you during recovery. Most people can return to normal activities 2-6 weeks after the procedure. °· Do not drive until your health care provider approves. °· Do not drive or use heavy machinery while taking prescription pain medicine. °· Do not lift anything that is heavier  than 10 lb (4.5 kg), or the limit that you are told, until your health care provider says that it is safe. °· Exercise regularly, as directed by your health care provider. °· Attend rehabilitation therapy as told by your health care provider. This may include: °? Physical and occupational therapy. °? Speech-language therapy. °? Brain exercises. °? Balance exercises. °? Individual or group therapy. °? Education about your condition and treatment. °Eating and drinking °· Drink enough fluid to keep your urine pale yellow. °· Eat a healthy diet. This includes plenty of fruits and vegetables, whole grains, low-fat dairy products, and lean protein. °General instructions °· Do not use any products that contain nicotine or tobacco, such as cigarettes and e-cigarettes. If you need help quitting, ask your health care provider. °· Do not take baths, swim, or use a hot tub until your health care provider approves. Ask your health care provider if you may take showers. You may only be allowed to take sponge baths for bathing. °· Manage your stress. If you need help with this, talk with your health care provider. °· Wear compression stockings as told by your health care provider. These stockings help to prevent blood clots and reduce swelling in your legs. °· Keep all follow-up visits as told by your health care provider. This is important. °Contact a health care provider if: °· You have redness, swelling, or pain around your incision. °· You have fluid or blood coming from your incision. °· Your incision feels warm to the touch. °· You have pus or a bad smell coming from your incision. °· You have   a fever. °Get help right away if: °· You have: °? Stiffness in your neck. °? Pain, numbness, weakness, or swelling in your legs. °? Severe chest pain. °? Difficulty breathing. °? Confusion. °· You have any symptoms of stroke. "BE FAST" is an easy way to remember the main warning signs of stroke: °? B - Balance. Signs are dizziness,  sudden trouble walking, or loss of balance. °? E - Eyes. Signs are trouble seeing or a sudden change in vision. °? F - Face. Signs are sudden weakness or numbness of the face, or the face or eyelid drooping on one side. °? A - Arms. Signs are weakness or numbness in an arm. This happens suddenly and usually on one side of the body. °? S - Speech. Signs are sudden trouble speaking, slurred speech, or trouble understanding what people say. °? T - Time. Time to call emergency services. Write down what time symptoms started. °· You have other signs of stroke, such as: °? A sudden, severe headache that does not get better with medicine. °? Sudden nausea or vomiting. °? A seizure. °These symptoms may represent a serious problem that is an emergency. Do not wait to see if the symptoms will go away. Get medical help right away. Call your local emergency services (911 in the [place] Do not drive yourself to the hospital. °Summary °· After this procedure, it is common to have some pain and swelling around your incision. °· Follow instructions from your health care provider about how to take care of your incision. Check for signs of infection every day. °· Most people can return to normal activities 2-6 weeks after the procedure. Ask your health care provider what activities are safe for you during recovery. °· Do not drive until your health care provider approves. Do not drive while taking prescription pain medicine. °· Do not use any products that contain nicotine or tobacco, such as cigarettes and e-cigarettes. If you need help quitting, ask your health care provider. °This information is not intended to replace advice given to you by your health care provider. Make sure you discuss any questions you have with your health care provider. °Document Released: 07/31/2016 Document Revised: 07/31/2016 Document Reviewed: 07/31/2016 °Elsevier Interactive Patient Education © 2018 [employer] ° °

## 2017-07-31 NOTE — Progress Notes (Signed)
*  PRELIMINARY RESULTS* Vascular Ultrasound Limited Right Lower Extremity Arterial Duplex has been completed.   The right common femoral and superficial femoral arteries are patent with no evidence of pseudoaneurysm or arteriovenous fistula. No evidence of deep vein thrombosis involving the right common femoral vein.  07/31/2017 2:55 PM Gertie FeyMichelle Ioma Chismar, BS, RVT, RDCS, RDMS

## 2017-07-31 NOTE — Progress Notes (Signed)
  Echocardiogram 2D Echocardiogram has been performed.  Anita Rhodes, Anita Rhodes 07/31/2017, 5:50 PM

## 2017-08-01 ENCOUNTER — Other Ambulatory Visit (HOSPITAL_COMMUNITY): Payer: Self-pay | Admitting: Interventional Radiology

## 2017-08-01 ENCOUNTER — Encounter (HOSPITAL_COMMUNITY): Payer: Self-pay | Admitting: Interventional Radiology

## 2017-08-01 DIAGNOSIS — I671 Cerebral aneurysm, nonruptured: Secondary | ICD-10-CM

## 2017-08-01 DIAGNOSIS — R079 Chest pain, unspecified: Secondary | ICD-10-CM

## 2017-08-01 LAB — ECHOCARDIOGRAM COMPLETE
HEIGHTINCHES: 64 in
WEIGHTICAEL: 2912 [oz_av]

## 2017-08-01 NOTE — Progress Notes (Signed)
Name: Anita Rhodes MRN: [id number] DOB: [birth year]    ADMISSION DATE:  07/30/2017 CONSULTATION DATE:  07/31/2017  REFERRING MD :  Dr. 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 Corliss Skains since.  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  It appears her blood pressure has been poorly controlled at times thus intervention delayed to obtain better blood pressure control.  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  Patient was asymptotic prior to procedure and taking plavix and aspirin.    She underwent procedure with endovascular treatment of 2 blister aneurysms of right ICA.  Post-operatively she was baseline neuro status with two episodes of bleeding at groin site with hemostasis maintained with manual pressure.  She returned to the Neuro ICU for further recovery requiring cardene infusion for BP goals and subtherapeutic on heparin gtt followed by pharmacy.  Around 0230 this am, she developed dull like chest pain with Twave changes on EKG.  PCCM consulted for further assistance.   She reports epigastric/ substernal dullness, constant that has been waxing and waning with some radiation of pain into left shoulder.  The pain changes at times with deep breath but not always, and palpation seems to change the pain.  She reports she has been burping frequently today, but otherwise denies shortness of breath, nausea, vomiting, visual changes, cough, tingling/ numbness, abdominal pain or back pain.    SUBJECTIVE:  Pain currently at a 5/10, but able to rest and intermittently fall asleep  VITAL SIGNS: Temp:   [98.6 F (37 C)-99.4 F (37.4 C)] 99.2 F (37.3 C) (03/27 0800) Pulse Rate:  [70-98] 79 (03/27 1000) Resp:  [13-21] 19 (03/27 1000) BP: (123-152)/(74-106) 133/87 (03/27 1000) SpO2:  [92 %-100 %] 100 % (03/27 1000) Arterial Line BP: (145-160)/(77-86) 160/86 (03/26 1400)  PHYSICAL EXAMINATION: General:  Adult AA female lying in bed in NAD HEENT: MM pink/moist, pupils 4/ reactive, sclerae anicertic Neuro: awake, Alert, oriented, non-focal CV: ST 101, RRR, no m/r/g, right groin site soft, with bulky dry gauze dressing PULM: even/non-labored, lungs bilaterally clear, 100% on room air, normal respirations, no wheezing GI: obese, soft, non-tender, bs active  Extremities: warm/dry, no edema  Skin: no rashes    Recent Labs  Lab 07/30/17 0648 07/31/17 0322  NA 135 135  K 3.6 3.6  CL 104 107  CO2 21* 18*  BUN 12 8  CREATININE 0.83 0.67  GLUCOSE 110* 146*   Recent Labs  Lab 07/30/17 0648 07/31/17 0322  HGB 12.1 10.8*  HCT 37.4 33.7*  WBC 7.6 11.3*  PLT 288 283   No results found.    SIGNIFICANT EVENTS: 3/25 Admitted   LINES/TUBES: PIV x 2  3/25 Left radial aline >>out  DISCUSSION: 47 yoF with PMH of HTN, asthma, and aneurysms admitted 3/25 for elective cerebral angiogram with intervention.  S/p endovascular repair of 2 blister aneurysms of Right ICA with stent. Returned to ICU on cardene for bp control and heparin gtt.  Developed chest pain with nonspecific twave changes.  PCCM asked to consult.   ASSESSMENT / PLAN:  Chest pain- with history of  poorly controlled HTN. No evidence of ischemia.  No chest pain this am.  Hemodynamically stable.  PLAN -  Continue asa, plavix Follow troponin - remain neg  Awaiting echo read  Likely d/c home today per primary team  outpt cardiology and PCP f/u  BP management    Blister aneurysms of Right ICA x 2 s/p endovascular repair with stent P:  Per Interventional Vascular Service  To remain on heparin gtt, asa, plavix,  nimodipine Frequent neuro exams Prn toradol and oxy IR    Likely d/c home if echo ok.  PCCM singing off please call back if needed.     Dirk DressKaty Adolph Clutter, NP 08/01/2017  10:45 AM Pager: [phone] or [phone]

## 2017-08-01 NOTE — Progress Notes (Signed)
Discharge education given. All questions answered. Patient discharged home with boyfriend. Dicie BeamFrazier, Tobby Fawcett RN BSN

## 2017-08-01 NOTE — Progress Notes (Signed)
Pt ambulated throughout the hallway x2 with no chest pain. PT sitting up in chair at this time. Will continue to monitor. Dicie BeamFrazier, Stevens Magwood RN BSN.

## 2017-08-01 NOTE — Progress Notes (Addendum)
Progress Note  Patient Name: Anita Rhodes Date of Encounter: 08/01/2017  Primary Cardiologist: Anita NoeHenry W Smith III, MD   Subjective   Denies any CP or SOB. Feel good  Inpatient Medications    Scheduled Meds: . aspirin  325 mg Oral Daily   Or  . aspirin  324 mg Per Tube Daily  . clopidogrel  75 mg Oral Daily   Or  . clopidogrel  75 mg Per Tube Daily   Continuous Infusions: . sodium chloride Stopped (08/01/17 0800)  . niCARDipine Stopped (07/31/17 0541)   PRN Meds: acetaminophen **OR** acetaminophen (TYLENOL) oral liquid 160 mg/5 mL **OR** acetaminophen, albuterol, butalbital-acetaminophen-caffeine, ibuprofen **AND** diphenhydrAMINE, ketorolac, simethicone   Vital Signs    Vitals:   08/01/17 0900 08/01/17 1000 08/01/17 1100 08/01/17 1200  BP: (!) 141/98 133/87 (!) 138/91 136/88  Pulse: 84 79 78 77  Resp: 16 19 16 15   Temp:      TempSrc:      SpO2: 100% 100% 100% 100%  Weight:      Height:        Intake/Output Summary (Last 24 hours) at 08/01/2017 1255 Last data filed at 08/01/2017 0600 Gross per 24 hour  Intake 1575 ml  Output -  Net 1575 ml   Filed Weights   07/30/17 0629 07/30/17 0657  Weight: 182 lb (82.6 kg) 182 lb (82.6 kg)    Telemetry    NSR with HR 70-80s - Personally Reviewed  ECG    NSR without ischemia - Personally Reviewed  Physical Exam   GEN: No acute distress.   Neck: No JVD Cardiac: RRR, no murmurs, rubs, or gallops.  Respiratory: Clear to auscultation bilaterally. GI: Soft, nontender, non-distended  MS: No edema; No deformity. Neuro:  Nonfocal  Psych: Normal affect   Labs    Chemistry Recent Labs  Lab 07/30/17 0648 07/31/17 0322  NA 135 135  K 3.6 3.6  CL 104 107  CO2 21* 18*  GLUCOSE 110* 146*  BUN 12 8  CREATININE 0.83 0.67  CALCIUM 9.5 9.3  PROT 7.5  --   ALBUMIN 3.9  --   AST 23  --   ALT 30  --   ALKPHOS 74  --   BILITOT 0.4  --   GFRNONAA >60 >60  GFRAA >60 >60  ANIONGAP 10 10      Hematology Recent Labs  Lab 07/30/17 0648 07/31/17 0322  WBC 7.6 11.3*  RBC 4.26 3.92  HGB 12.1 10.8*  HCT 37.4 33.7*  MCV 87.8 86.0  MCH 28.4 27.6  MCHC 32.4 32.0  RDW 13.9 13.8  PLT 288 283    Cardiac Enzymes Recent Labs  Lab 07/31/17 0322 07/31/17 1026 07/31/17 1938  TROPONINI <0.03 <0.03 <0.03   No results for input(s): TROPIPOC in the last 168 hours.   BNPNo results for input(s): BNP, PROBNP in the last 168 hours.   DDimer No results for input(s): DDIMER in the last 168 hours.   Radiology    No results found.  Cardiac Studies   Pending echo  Patient Profile     42 y.o. female with PMH of HTN, obesity, asthma, and prior SAH with ruptured L ICA aneurysm s/p stent 6/14 found to have a new blister aneurysm of R ICA on CTA 05/2017. She presented on 07/30/2017 for elevated cerebral angiogram. Post op, she had chest pain worse with deep inspiration.   Assessment & Plan    1. Atypical chest pain: Serial trop normal. EKG  no obvious ischemic changes. Echo done last night but not read, will inform echo reader. If no wall motion abnormality and patient can ambulate without chest pain, then she is stable for discharge from cardiac perspective. Cardiology outpatient followup arranged and placed in the chart.   Addendum: echo back, normal EF and wall motion  2. R ICA aneurysm: s/p R common and R [facility] arteriogram followed by endovascular treatment of 2 blister aneurysm of R ICA by Dr. Corliss Orton Rhodes  3. HTN: BP controlled in the hospital, however will need to make sure her BP is controlled as outpatient   For questions or updates, please contact [facility] Please consult [email or web address] for contact info under Cardiology/STEMI.      Anita Dial, PA  08/01/2017, 12:55 PM    Personally seen and examined. Agree with above.  Feels well, no CP, no SOB Exam: RRR, obese, CTAB, no edema, alert Labs: as above, reviewed ECHO: personally reviewed, EF normal, trace MR. PASP  (obesity)  CP  - atypical, reassuring ECHO and troponin. OK for DC. No further testing. Has outpatient followup  Obesity  - continue to encourage weight loss  RICA aneursym  - Dr. Corliss Anita Rhodes. Plavix, ASA  OK with DC  Anita Schultz, MD

## 2017-08-02 ENCOUNTER — Encounter (HOSPITAL_COMMUNITY): Payer: Self-pay | Admitting: Interventional Radiology

## 2017-08-10 ENCOUNTER — Encounter (HOSPITAL_COMMUNITY): Payer: Self-pay | Admitting: Interventional Radiology

## 2017-08-10 ENCOUNTER — Telehealth: Payer: Self-pay | Admitting: Student

## 2017-08-10 ENCOUNTER — Ambulatory Visit (HOSPITAL_COMMUNITY)
Admission: RE | Admit: 2017-08-10 | Discharge: 2017-08-10 | Disposition: A | Discharge status: Home / Self Care | Payer: 59 | Source: Ambulatory Visit | Attending: Interventional Radiology | Admitting: Interventional Radiology

## 2017-08-10 DIAGNOSIS — I671 Cerebral aneurysm, nonruptured: Secondary | ICD-10-CM

## 2017-08-10 HISTORY — PX: IR RADIOLOGIST EVAL & MGMT: IMG5224

## 2017-08-10 NOTE — Telephone Encounter (Signed)
Called patient regarding message I received this afternoon. Patient was seen by Dr. Corliss Skainseveshwar today at 13:00 for follow-up regarding her right ICA blister aneurysms x2 s/p embolization 07/30/2017.  Patient states that Dr. Corliss Skainseveshwar told her she can return to work this Monday 08/13/2017. States she does not feel like she is ready to go back to work at this time due to her headaches. I asked when she would like to return back to work. States that she feels like she needs another week off. Informed patient that, per Dr. Corliss Skainseveshwar, she does not have to go back to work this week. She will now be cleared for work Monday 08/20/2017. Informed patient that if she needs more time off than this, she is to go to her PCP to ask for more time off.  All questions answered and concerns addressed. Patient conveys understanding and agrees with plan.  Waylan Bogalexandra M Louk, PA-C 08/10/2017, 4:00 PM

## 2017-08-12 ENCOUNTER — Emergency Department (HOSPITAL_COMMUNITY): Payer: 59

## 2017-08-12 ENCOUNTER — Other Ambulatory Visit: Payer: Self-pay

## 2017-08-12 ENCOUNTER — Emergency Department (HOSPITAL_COMMUNITY)
Admission: EM | Admit: 2017-08-12 | Discharge: 2017-08-12 | Disposition: A | Discharge status: Home / Self Care | Payer: 59 | Attending: Emergency Medicine | Admitting: Emergency Medicine

## 2017-08-12 ENCOUNTER — Encounter (HOSPITAL_COMMUNITY): Payer: Self-pay | Admitting: Emergency Medicine

## 2017-08-12 DIAGNOSIS — R1032 Left lower quadrant pain: Secondary | ICD-10-CM | POA: Diagnosis present

## 2017-08-12 DIAGNOSIS — Z79899 Other long term (current) drug therapy: Secondary | ICD-10-CM | POA: Diagnosis not present

## 2017-08-12 DIAGNOSIS — R109 Unspecified abdominal pain: Secondary | ICD-10-CM

## 2017-08-12 DIAGNOSIS — N946 Dysmenorrhea, unspecified: Secondary | ICD-10-CM | POA: Diagnosis not present

## 2017-08-12 DIAGNOSIS — J45909 Unspecified asthma, uncomplicated: Secondary | ICD-10-CM | POA: Insufficient documentation

## 2017-08-12 DIAGNOSIS — I10 Essential (primary) hypertension: Secondary | ICD-10-CM | POA: Diagnosis not present

## 2017-08-12 DIAGNOSIS — R102 Pelvic and perineal pain: Secondary | ICD-10-CM

## 2017-08-12 LAB — URINALYSIS, ROUTINE W REFLEX MICROSCOPIC
Bacteria, UA: NONE SEEN
Bilirubin Urine: NEGATIVE
Glucose, UA: NEGATIVE mg/dL
KETONES UR: 80 mg/dL — AB
Leukocytes, UA: NEGATIVE
Nitrite: NEGATIVE
PH: 8 (ref 5.0–8.0)
Protein, ur: 30 mg/dL — AB
Specific Gravity, Urine: 1.021 (ref 1.005–1.030)

## 2017-08-12 LAB — COMPREHENSIVE METABOLIC PANEL
ALBUMIN: 4.6 g/dL (ref 3.5–5.0)
ALT: 30 U/L (ref 14–54)
AST: 21 U/L (ref 15–41)
Alkaline Phosphatase: 91 U/L (ref 38–126)
Anion gap: 11 (ref 5–15)
BUN: 16 mg/dL (ref 6–20)
CHLORIDE: 108 mmol/L (ref 101–111)
CO2: 20 mmol/L — AB (ref 22–32)
CREATININE: 0.92 mg/dL (ref 0.44–1.00)
Calcium: 10.5 mg/dL — ABNORMAL HIGH (ref 8.9–10.3)
GFR calc Af Amer: >60 mL/min (ref >60)
GFR calc non Af Amer: >60 mL/min (ref >60)
Glucose, Bld: 115 mg/dL — ABNORMAL HIGH (ref 65–99)
Potassium: 3.8 mmol/L (ref 3.5–5.1)
SODIUM: 139 mmol/L (ref 135–145)
Total Bilirubin: 0.6 mg/dL (ref 0.3–1.2)
Total Protein: 8.8 g/dL — ABNORMAL HIGH (ref 6.5–8.1)

## 2017-08-12 LAB — LIPASE, BLOOD: LIPASE: 25 U/L (ref 11–51)

## 2017-08-12 LAB — CBC
HCT: 39.6 % (ref 36.0–46.0)
Hemoglobin: 13.2 g/dL (ref 12.0–15.0)
MCH: 28.9 pg (ref 26.0–34.0)
MCHC: 33.3 g/dL (ref 30.0–36.0)
MCV: 86.8 fL (ref 78.0–100.0)
PLATELETS: 365 10*3/uL (ref 150–400)
RBC: 4.56 MIL/uL (ref 3.87–5.11)
RDW: 14 % (ref 11.5–15.5)
WBC: 10.3 10*3/uL (ref 4.0–10.5)

## 2017-08-12 LAB — POC URINE PREG, ED: Preg Test, Ur: NEGATIVE

## 2017-08-12 LAB — I-STAT BETA HCG BLOOD, ED (MC, WL, AP ONLY)

## 2017-08-12 MED ORDER — HYDROMORPHONE HCL 1 MG/ML IJ SOLN: 0.5000 mg | Freq: Once | INTRAMUSCULAR | Status: DC

## 2017-08-12 MED ORDER — HYDROCODONE-ACETAMINOPHEN 5-325 MG PO TABS: 1.0000 | ORAL_TABLET | ORAL | 0 refills | Status: DC | PRN

## 2017-08-12 MED ORDER — HYDROMORPHONE HCL 1 MG/ML IJ SOLN
1.0000 mg | INTRAMUSCULAR | Status: AC | PRN
  Administered 2017-08-12 (×2): 1 mg via INTRAVENOUS
  Filled 2017-08-12 (×2): qty 1

## 2017-08-12 MED ORDER — ONDANSETRON 4 MG PO TBDP
4.0000 mg | ORAL_TABLET | Freq: Once | ORAL | Status: AC | PRN
  Administered 2017-08-12: 4 mg via ORAL
  Filled 2017-08-12: qty 1

## 2017-08-12 MED ORDER — KETOROLAC TROMETHAMINE 30 MG/ML IJ SOLN
INTRAMUSCULAR | Status: AC
  Filled 2017-08-12: qty 1

## 2017-08-12 MED ORDER — KETOROLAC TROMETHAMINE 30 MG/ML IJ SOLN
30.0000 mg | Freq: Once | INTRAMUSCULAR | Status: AC
  Administered 2017-08-12: 30 mg via INTRAVENOUS

## 2017-08-12 MED ORDER — SODIUM CHLORIDE 0.9 % IV BOLUS
1000.0000 mL | Freq: Once | INTRAVENOUS | Status: AC
  Administered 2017-08-12: 1000 mL via INTRAVENOUS

## 2017-08-12 MED ORDER — ONDANSETRON HCL 4 MG/2ML IJ SOLN
4.0000 mg | Freq: Once | INTRAMUSCULAR | Status: AC
Start: 2017-08-12 — End: 2017-08-12
  Administered 2017-08-12: 4 mg via INTRAVENOUS
  Filled 2017-08-12: qty 2

## 2017-08-12 NOTE — ED Triage Notes (Signed)
Pt c/o abd pain, mid-lower back pain with vomiting that started today. Denies any diarrhea or urinary problems.

## 2017-08-12 NOTE — ED Provider Notes (Addendum)
[facility]-EMERGENCY DEPT Provider Note   CSN: [id number] Arrival date & time: 08/12/17  1740     History   Chief Complaint Chief Complaint  Patient presents with  . Abdominal Pain  . Back Pain  . Emesis    HPI Anita Rhodes is a 42 y.o. female.  Complaint is abdominal pain  HPI: 42 year old female.  She presents here with abdominal pain starting approximately noon today.  Tribes it is colicky.  It is suprapubic.  Bilateral lower quadrants.  He goes to her back.  It is in the abdomen, not to the flank.  Nausea and vomiting.  She is on her menstrual cycle.  Has had minimal difficulty in the past with menstrual cramping.  Kidney stones.  No dysuria or recent frequency.  History of ovarian disease.  Had embolization of CNS aneurysm at [facility] 3-4 weeks ago.  Is doing well with without headaches.  Past Medical History:  Diagnosis Date  . Asthma   . Brain aneurysm   . Family history of brain aneurysm   . Frequent headaches   . Hypertension   . Wears glasses     Patient Active Problem List   Diagnosis Date Noted  . Acute chest pain   . Brain aneurysm 07/30/2017  . Increased anion gap metabolic acidosis 10/22/2012  . Asthma 10/22/2012  . New onset of headaches 10/22/2012  . Subarachnoid hemorrhage (HCC) 10/22/2012  . Respiratory failure, post-operative (HCC) 10/22/2012    Past Surgical History:  Procedure Laterality Date  . arthroscopic knee    . IR 3D INDEPENDENT WKST  05/22/2017  . IR ANGIO INTRA EXTRACRAN SEL COM CAROTID INNOMINATE BILAT MOD SED  05/22/2017  . IR ANGIO INTRA EXTRACRAN SEL INTERNAL CAROTID UNI R MOD SED  07/30/2017  . IR ANGIO VERTEBRAL SEL SUBCLAVIAN INNOMINATE UNI R MOD SED  07/30/2017  . IR ANGIO VERTEBRAL SEL VERTEBRAL BILAT MOD SED  05/22/2017  . IR ANGIOGRAM FOLLOW UP STUDY  07/30/2017  . IR RADIOLOGIST EVAL & MGMT  08/10/2017  . IR TRANSCATH/EMBOLIZ  07/30/2017  . OTHER SURGICAL HISTORY     aneurysm repair.  Marland Kitchen  RADIOLOGY WITH ANESTHESIA N/A 06/27/2017   Procedure: RADIOLOGY WITH ANESTHESIA, EMBOLIZATION;  Surgeon: Julieanne Cotton, MD;  Location: [facility] OR;  Service: Radiology;  Laterality: N/A;  . RADIOLOGY WITH ANESTHESIA N/A 07/30/2017   Procedure: EMBOLIZATION;  Surgeon: Julieanne Cotton, MD;  Location: [facility] OR;  Service: Radiology;  Laterality: N/A;     OB History   None      Home Medications    Prior to Admission medications   Medication Sig Start Date End Date Taking? Authorizing Provider  albuterol (PROVENTIL HFA;VENTOLIN HFA) 108 (90 BASE) MCG/ACT inhaler Inhale 2 puffs into the lungs every 6 (six) hours as needed for wheezing or shortness of breath.   Yes [provider]  aspirin 325 MG tablet Take 1 tablet (325 mg total) by mouth daily with breakfast. 11/02/12  Yes Colon Branch, MD  butalbital-acetaminophen-caffeine (FIORICET WITH CODEINE) [phone] MG per capsule Take 1 capsule by mouth every 6 (six) hours as needed for headache.  04/02/13  Yes [provider]  clopidogrel (PLAVIX) 75 MG tablet Take 75 mg by mouth daily. 06/17/17  Yes [provider]  Ibuprofen-Diphenhydramine HCl (ADVIL PM) 200-25 MG CAPS Take 1 tablet by mouth at bedtime as needed (for sleep/headaches.).   Yes [provider]  Multiple Vitamin (MULTIVITAMIN WITH MINERALS) TABS tablet Take 1 tablet by mouth daily.  Yes [provider]  HYDROcodone-acetaminophen (NORCO/VICODIN) 5-325 MG tablet Take 1 tablet by mouth every 4 (four) hours as needed. 08/12/17   Rolland Porter, MD  NIFEdipine (PROCARDIA XL/ADALAT-CC) 90 MG 24 hr tablet Take 90 mg by mouth daily. 07/25/17   [provider]    Family History Family History  Problem Relation Age of Onset  . Hyperlipidemia Father   . Aneurysm Father   . Stroke Father   . Hypertension Mother   . Hypertension Other   . Hypertension Paternal Grandfather     Social History Social History   Tobacco Use  . Smoking status:  Never Smoker  . Smokeless tobacco: Never Used  Substance Use Topics  . Alcohol use: Yes    Comment: social  . Drug use: No     Allergies   Banana and Fish-derived products   Review of Systems Review of Systems  Constitutional: Negative for appetite change, chills, diaphoresis, fatigue and fever.  HENT: Negative for mouth sores, sore throat and trouble swallowing.   Eyes: Negative for visual disturbance.  Respiratory: Negative for cough, chest tightness, shortness of breath and wheezing.   Cardiovascular: Negative for chest pain.  Gastrointestinal: Positive for abdominal pain. Negative for abdominal distention, diarrhea, nausea and vomiting.  Endocrine: Negative for polydipsia, polyphagia and polyuria.  Genitourinary: Positive for menstrual problem. Negative for dysuria, frequency and hematuria.  Musculoskeletal: Negative for gait problem.  Skin: Negative for color change, pallor and rash.  Neurological: Negative for dizziness, syncope, light-headedness and headaches.  Hematological: Does not bruise/bleed easily.  Psychiatric/Behavioral: Negative for behavioral problems and confusion.     Physical Exam Updated Vital Signs BP 139/86 (BP Location: Left Arm)   Pulse (!) 115   Temp 98.4 F (36.9 C) (Oral)   Resp 18   Ht 5\' 4"  (1.626 m)   Wt 82.6 kg (182 lb)   LMP 08/08/2017   SpO2 100%   BMI 31.24 kg/m   Physical Exam  Constitutional: She is oriented to person, place, and time. She appears well-developed and well-nourished. No distress.  HENT:  Head: Normocephalic.  Eyes: Pupils are equal, round, and reactive to light. Conjunctivae are normal. No scleral icterus.  Neck: Normal range of motion. Neck supple. No thyromegaly present.  Cardiovascular: Normal rate and regular rhythm. Exam reveals no gallop and no friction rub.  No murmur heard. Pulmonary/Chest: Effort normal and breath sounds normal. No respiratory distress. She has no wheezes. She has no rales.  Abdominal:  Soft. Bowel sounds are normal. She exhibits no distension. There is no tenderness. There is no rebound.  Patient appears uncomfortable but has episodes of colic where her pain becomes worse.  She places her hand or suprapubic abdomen.  Musculoskeletal: Normal range of motion.  Neurological: She is alert and oriented to person, place, and time.  Skin: Skin is warm and dry. No rash noted.  Psychiatric: She has a normal mood and affect. Her behavior is normal.     ED Treatments / Results  Labs (all labs ordered are listed, but only abnormal results are displayed) Labs Reviewed  COMPREHENSIVE METABOLIC PANEL - Abnormal; Notable for the following components:      Result Value   CO2 20 (*)    Glucose, Bld 115 (*)    Calcium 10.5 (*)    Total Protein 8.8 (*)    All other components within normal limits  LIPASE, BLOOD  CBC  URINALYSIS, ROUTINE W REFLEX MICROSCOPIC  I-STAT BETA HCG BLOOD, ED ([facility], [facility],  [facility] ONLY)  POC URINE PREG, ED    EKG None  Radiology [place] Pelvis Transvanginal Non-ob (tv Only)  Result Date: 08/12/2017 CLINICAL DATA:  Pelvic pain.  Assess for ovarian torsion. EXAM: TRANSABDOMINAL AND TRANSVAGINAL ULTRASOUND OF PELVIS DOPPLER ULTRASOUND OF OVARIES TECHNIQUE: Both transabdominal and transvaginal ultrasound examinations of the pelvis were performed. Transabdominal technique was performed for global imaging of the pelvis including uterus, ovaries, adnexal regions, and pelvic cul-de-sac. It was necessary to proceed with endovaginal exam following the transabdominal exam to visualize the bilateral ovaries. Color and duplex Doppler ultrasound was utilized to evaluate blood flow to the ovaries. COMPARISON:  CT abdomen pelvis August 12, 2017 FINDINGS: Uterus Measurements: 10.3 x 3.9 x 5.3 cm. There is no bicornuate uterus. The CT question uterine abnormality is due to a pedunculated left uterine fibroid measuring 5.2 x 4.1 x 4.7 cm. Endometrium Thickness: 6.5 mm. Blood products and fluid is  identified in the endometrial cavity correlating to the CT finding. The patient is on her period currently. Right ovary Measurements: 3.8 x 2.1 x 2.1 cm. Normal appearance/no adnexal mass. Left ovary Measurements: 3.5 x 2 x 2.5 cm.  Normal appearance/no adnexal mass. Pulsed Doppler evaluation of both ovaries demonstrates normal low-resistance arterial and venous waveforms. Other findings No abnormal free fluid. IMPRESSION: No evidence of ovarian torsion. There is no bicornuate uterus. The CT question uterine abnormality is due to a pedunculated left uterine fibroid measuring 5.2 x 4.1 x 4.7 cm. Blood products and fluid is identified in the endometrial cavity correlating to the CT finding. The patient is on her period currently. Electronically Signed   By: Sherian ReinWei-Chen  Lin M.D.   On: 08/12/2017 23:03   [place] Pelvis (transabdominal Only)  Result Date: 08/12/2017 CLINICAL DATA:  Pelvic pain.  Assess for ovarian torsion. EXAM: TRANSABDOMINAL AND TRANSVAGINAL ULTRASOUND OF PELVIS DOPPLER ULTRASOUND OF OVARIES TECHNIQUE: Both transabdominal and transvaginal ultrasound examinations of the pelvis were performed. Transabdominal technique was performed for global imaging of the pelvis including uterus, ovaries, adnexal regions, and pelvic cul-de-sac. It was necessary to proceed with endovaginal exam following the transabdominal exam to visualize the bilateral ovaries. Color and duplex Doppler ultrasound was utilized to evaluate blood flow to the ovaries. COMPARISON:  CT abdomen pelvis August 12, 2017 FINDINGS: Uterus Measurements: 10.3 x 3.9 x 5.3 cm. There is no bicornuate uterus. The CT question uterine abnormality is due to a pedunculated left uterine fibroid measuring 5.2 x 4.1 x 4.7 cm. Endometrium Thickness: 6.5 mm. Blood products and fluid is identified in the endometrial cavity correlating to the CT finding. The patient is on her period currently. Right ovary Measurements: 3.8 x 2.1 x 2.1 cm. Normal appearance/no adnexal  mass. Left ovary Measurements: 3.5 x 2 x 2.5 cm.  Normal appearance/no adnexal mass. Pulsed Doppler evaluation of both ovaries demonstrates normal low-resistance arterial and venous waveforms. Other findings No abnormal free fluid. IMPRESSION: No evidence of ovarian torsion. There is no bicornuate uterus. The CT question uterine abnormality is due to a pedunculated left uterine fibroid measuring 5.2 x 4.1 x 4.7 cm. Blood products and fluid is identified in the endometrial cavity correlating to the CT finding. The patient is on her period currently. Electronically Signed   By: Sherian ReinWei-Chen  Lin M.D.   On: 08/12/2017 23:03   [place] Pelvic Doppler (torsion R/o Or Mass Arterial Flow)  Result Date: 08/12/2017 CLINICAL DATA:  Pelvic pain.  Assess for ovarian torsion. EXAM: TRANSABDOMINAL AND TRANSVAGINAL ULTRASOUND OF PELVIS DOPPLER ULTRASOUND OF OVARIES TECHNIQUE: Both  transabdominal and transvaginal ultrasound examinations of the pelvis were performed. Transabdominal technique was performed for global imaging of the pelvis including uterus, ovaries, adnexal regions, and pelvic cul-de-sac. It was necessary to proceed with endovaginal exam following the transabdominal exam to visualize the bilateral ovaries. Color and duplex Doppler ultrasound was utilized to evaluate blood flow to the ovaries. COMPARISON:  CT abdomen pelvis August 12, 2017 FINDINGS: Uterus Measurements: 10.3 x 3.9 x 5.3 cm. There is no bicornuate uterus. The CT question uterine abnormality is due to a pedunculated left uterine fibroid measuring 5.2 x 4.1 x 4.7 cm. Endometrium Thickness: 6.5 mm. Blood products and fluid is identified in the endometrial cavity correlating to the CT finding. The patient is on her period currently. Right ovary Measurements: 3.8 x 2.1 x 2.1 cm. Normal appearance/no adnexal mass. Left ovary Measurements: 3.5 x 2 x 2.5 cm.  Normal appearance/no adnexal mass. Pulsed Doppler evaluation of both ovaries demonstrates normal low-resistance  arterial and venous waveforms. Other findings No abnormal free fluid. IMPRESSION: No evidence of ovarian torsion. There is no bicornuate uterus. The CT question uterine abnormality is due to a pedunculated left uterine fibroid measuring 5.2 x 4.1 x 4.7 cm. Blood products and fluid is identified in the endometrial cavity correlating to the CT finding. The patient is on her period currently. Electronically Signed   By: Sherian Rein M.D.   On: 08/12/2017 23:03   Ct Renal Stone Study  Result Date: 08/12/2017 CLINICAL DATA:  Mid lower abdomen pain starting today. EXAM: CT ABDOMEN AND PELVIS WITHOUT CONTRAST TECHNIQUE: Multidetector CT imaging of the abdomen and pelvis was performed following the standard protocol without IV contrast. COMPARISON:  None. FINDINGS: Lower chest: No acute abnormality. Hepatobiliary: No focal liver abnormality is seen. No gallstones, gallbladder wall thickening, or biliary dilatation. Pancreas: Unremarkable. No pancreatic ductal dilatation or surrounding inflammatory changes. Spleen: Normal in size without focal abnormality. Adrenals/Urinary Tract: Adrenal glands are unremarkable. Kidneys are normal, without renal calculi, focal lesion, or hydronephrosis. Bladder is unremarkable. Stomach/Bowel: Stomach is within normal limits. Appendix appears normal. No evidence of bowel wall thickening, distention, or inflammatory changes. Vascular/Lymphatic: No significant vascular findings are present. No enlarged abdominal or pelvic lymph nodes. Reproductive: There is question bicornuate uterus. Fluids/ question blood is identified in the lower uterine segment. The bilateral ovaries are normal. Other: None. Musculoskeletal: No acute or significant osseous findings. IMPRESSION: No bowel obstruction.  The appendix is normal. No nephrolithiasis or hydroureteronephrosis is identified bilaterally. Question bicornuate uterus. Fluid/question blood is identified in the lower uterine segment. Electronically  Signed   By: Sherian Rein M.D.   On: 08/12/2017 21:34    Procedures Procedures (including critical care time)  Medications Ordered in ED Medications  HYDROmorphone (DILAUDID) injection 0.5 mg (has no administration in time range)  ketorolac (TORADOL) 30 MG/ML injection 30 mg (has no administration in time range)  ondansetron (ZOFRAN-ODT) disintegrating tablet 4 mg (4 mg Oral Given 08/12/17 1749)  ondansetron (ZOFRAN) injection 4 mg (4 mg Intravenous Given 08/12/17 2038)  HYDROmorphone (DILAUDID) injection 1 mg (1 mg Intravenous Given 08/12/17 2253)  sodium chloride 0.9 % bolus 1,000 mL (1,000 mLs Intravenous New Bag/Given 08/12/17 2118)     Initial Impression / Assessment and Plan / ED Course  I have reviewed the triage vital signs and the nursing notes.  Pertinent labs & imaging results that were available during my care of the patient were reviewed by me and considered in my medical decision making (see chart for details).  Diagnosis renal colic, ovarian torsion, ovarian cyst rupture, UTI/Pilo.  Menstrual syndrome, intestinal colic.  N.p.o.  Urine does not show infection or marked blood.  CT scan of abdomen shows no acute findings.  Patient's awaiting ovarian ultrasound.  IV is placed.  Given IV Dilaudid.  Her symptoms have improved.  Is not febrile.  Does not have leukocytosis.  Final Clinical Impressions(s) / ED Diagnoses   Final diagnoses:  Abdominal pain, unspecified abdominal location  Dysmenorrhea    CT and ultrasound did not show erosive abnormalities.  Ultrasound confirms normal blood flow to both bruits.  She does have blood distending her uterus which is menstrual.  This may be heavy her symptoms are related to Plavix and her first period since starting on Plavix from her stroke.  Think she is appropriate for discharge home.  Her last heart rate is 95.  Was given 1 dose of Toradol.  She cannot have regular anti-inflammatories and I discussed this with her at home.  Advised to  return for fever, vomiting, other new or worsening symptoms.  ED Discharge Orders        Ordered    HYDROcodone-acetaminophen (NORCO/VICODIN) 5-325 MG tablet  Every 4 hours PRN     08/12/17 2326       Rolland Porter, MD 08/12/17 2230    Rolland Porter, MD 08/12/17 2328

## 2017-08-12 NOTE — Discharge Instructions (Addendum)
YOur CT and Ultrasound do not show any concerning abnormalities. There is bllod in the uterus, which is normal during menstrual cycle.  Your pain may be secondary to heavier bleeding from the plavix you are on after your stroke. Hydrocodone for pain. Return to ER with any worsening, or changing symptoms.

## 2017-08-23 ENCOUNTER — Ambulatory Visit: Payer: 59 | Admitting: Cardiology

## 2017-08-27 ENCOUNTER — Ambulatory Visit: Payer: 59 | Admitting: Physician Assistant

## 2017-09-19 NOTE — Progress Notes (Signed)
Cardiology Office Note:    Date:  09/20/2017   ID:  [name], DOB 1975/08/30, MRN [id number]  PCP:  Ronney Lion, NP  Cardiologist:  Lesleigh Noe, MD   Referring MD: Ronney Lion, NP   Chief Complaint  Patient presents with  . Chest Pain    hospital follow up    History of Present Illness:    Anita Rhodes is a 42 y.o. 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 She was hospitalized 07/2017 for planned treatment of her blister aneurysms. Post-operatively, she complained of chest pain that was worse with deep inspiration. Echo obtained that hospitalization was normal with no wall motion abnormality. troponins were negative and EKG was without ischemia. No intervention initiated and she was scheduled for cardiology follow up. She has previously had trouble controlling her blood pressure at home.   Pt presents today for hospital follow up. She is here alone. Pt continues to have chest pain. She has had 3-4 occurrences since her discharge in March. The pain typically occurs when at rest, located in her central to left chest, does not radiate. Pain lasts about 5-10 min per episodes. No aggravating or relieving factors. Pain is rated as a 5/10 and is describes as a sharp, poking pain in her chest. No associated symptoms. She is a nonsmoker, but does have a history of HTN and family history of premature heart disease. The patient suspects a GI origin for her chest pain.    Past Medical History:  Diagnosis Date  . Asthma   . Brain aneurysm   . Family history of brain aneurysm   . Frequent headaches   . Hypertension   . Wears glasses     Past Surgical History:  Procedure Laterality Date  . arthroscopic knee    . IR 3D INDEPENDENT WKST  05/22/2017  . IR ANGIO INTRA EXTRACRAN SEL COM CAROTID INNOMINATE BILAT MOD SED  05/22/2017  . IR ANGIO INTRA EXTRACRAN SEL  INTERNAL CAROTID UNI R MOD SED  07/30/2017  . IR ANGIO VERTEBRAL SEL SUBCLAVIAN INNOMINATE UNI R MOD SED  07/30/2017  . IR ANGIO VERTEBRAL SEL VERTEBRAL BILAT MOD SED  05/22/2017  . IR ANGIOGRAM FOLLOW UP STUDY  07/30/2017  . IR RADIOLOGIST EVAL & MGMT  08/10/2017  . IR TRANSCATH/EMBOLIZ  07/30/2017  . OTHER SURGICAL HISTORY     aneurysm repair.  Anita Rhodes RADIOLOGY WITH ANESTHESIA N/A 06/27/2017   Procedure: RADIOLOGY WITH ANESTHESIA, EMBOLIZATION;  Surgeon: Julieanne Cotton, MD;  Location: [facility] OR;  Service: Radiology;  Laterality: N/A;  . RADIOLOGY WITH ANESTHESIA N/A 07/30/2017   Procedure: EMBOLIZATION;  Surgeon: Julieanne Cotton, MD;  Location: [facility] OR;  Service: Radiology;  Laterality: N/A;    Current Medications: Current Meds  Medication Sig  . albuterol (PROVENTIL HFA;VENTOLIN HFA) 108 (90 BASE) MCG/ACT inhaler Inhale 2 puffs into the lungs every 6 (six) hours as needed for wheezing or shortness of breath.  Anita Rhodes aspirin 325 MG tablet Take 1 tablet (325 mg total) by mouth daily with breakfast.  . butalbital-acetaminophen-caffeine (FIORICET WITH CODEINE) 50-325-40-30 MG per capsule Take 1 capsule by mouth every 6 (six) hours as needed for headache.   . clopidogrel (PLAVIX) 75 MG tablet Take 75 mg by mouth daily.  Anita Rhodes HYDROcodone-acetaminophen (NORCO/VICODIN) 5-325 MG tablet Take 1 tablet by mouth every 4 (four) hours as needed.  Anita Rhodes  Ibuprofen-Diphenhydramine HCl (ADVIL PM) 200-25 MG CAPS Take 1 tablet by mouth at bedtime as needed (for sleep/headaches.).  Anita Rhodes Multiple Vitamin (MULTIVITAMIN WITH MINERALS) TABS tablet Take 1 tablet by mouth daily.  Anita Rhodes NIFEdipine (PROCARDIA XL/ADALAT-CC) 90 MG 24 hr tablet Take 90 mg by mouth daily.     Allergies:   Banana and Fish-derived products   Social History   Socioeconomic History  . Marital status: Single    Spouse name: Not on file  . Number of children: Not on file  . Years of education: Not on file  . Highest education level: Not on file  Occupational  History  . Not on file  Social Needs  . Financial resource strain: Not on file  . Food insecurity:    Worry: Not on file    Inability: Not on file  . Transportation needs:    Medical: Not on file    Non-medical: Not on file  Tobacco Use  . Smoking status: Never Smoker  . Smokeless tobacco: Never Used  Substance and Sexual Activity  . Alcohol use: Yes    Comment: social  . Drug use: No  . Sexual activity: Not on file  Lifestyle  . Physical activity:    Days per week: Not on file    Minutes per session: Not on file  . Stress: Not on file  Relationships  . Social connections:    Talks on phone: Not on file    Gets together: Not on file    Attends religious service: Not on file    Active member of club or organization: Not on file    Attends meetings of clubs or organizations: Not on file    Relationship status: Not on file  Other Topics Concern  . Not on file  Social History Narrative  . Not on file     Family History: The patient's family history includes Aneurysm in her father; Hyperlipidemia in her father; Hypertension in her mother, other, and paternal grandfather; Stroke in her father.  ROS:   Please see the history of present illness.    All other systems reviewed and are negative.  EKGs/Labs/Other Studies Reviewed:    The following studies were reviewed today:  Echo 07/31/17 Study Conclusions - Left ventricle: The cavity size was normal. Wall thickness was   normal. Systolic function was normal. The estimated ejection   fraction was in the range of 60% to 65%. Wall motion was normal;   there were no regional wall motion abnormalities. Left   ventricular diastolic function parameters were normal. - Aortic valve: There was no stenosis. - Mitral valve: There was trivial regurgitation. - Right ventricle: The cavity size was normal. Systolic function   was normal. - Tricuspid valve: Peak RV-RA gradient (S): 27 mm Hg. - Pulmonary arteries: PA peak pressure: 30  mm Hg (S). - Inferior vena cava: The vessel was normal in size. The   respirophasic diameter changes were in the normal range (>= 50%),   consistent with normal central venous pressure.  Impressions: - Normal study.   EKG:  EKG is ordered today.  The ekg ordered today demonstrates sinus rhythm  Recent Labs: 08/12/2017: ALT 30; BUN 16; Creatinine, Ser 0.92; Hemoglobin 13.2; Platelets 365; Potassium 3.8; Sodium 139  Recent Lipid Panel No results found for: CHOL, TRIG, HDL, CHOLHDL, VLDL, LDLCALC, LDLDIRECT  Physical Exam:    VS:  BP (!) 128/96   Pulse 88   Ht  (1.626 m)   Wt 186  lb 12.8 oz (84.7 kg)   BMI 32.06 kg/m     Wt Readings from Last 3 Encounters:  09/20/17 186 lb 12.8 oz (84.7 kg)  08/12/17 182 lb (82.6 kg)  07/30/17 182 lb (82.6 kg)     GEN: Well nourished, well developed in no acute distress HEENT: Normal NECK: No JVD; No carotid bruits LYMPHATICS: No lymphadenopathy CARDIAC: RRR, no murmurs, rubs, gallops RESPIRATORY:  Clear to auscultation without rales, wheezing or rhonchi  ABDOMEN: Soft, non-tender, non-distended MUSCULOSKELETAL:  No edema; No deformity  SKIN: Warm and dry NEUROLOGIC:  Alert and oriented x 3 PSYCHIATRIC:  Normal affect   ASSESSMENT:    1. Chest pain, unspecified type   2. Essential hypertension   3. Cerebral aneurysm    PLAN:    In order of problems listed above:  Chest pain, unspecified type Echo in hospital normal. Enzymes remained negative. EKG without acute ischemia. EKG today without ischemia. She continues to describe atypical chest pain. Risk factors include HTN. She has a family history of early heart disease (maternal aunt 65s, and grandmother 23s). Given her risk factors, will obtain coronary CT. If this can't be completed, will consider a nuclear stress test. Will also obtain lipids, LFTs, and A1c for secondary prevention. CBC (on DAPT) and BMP for kidney function prior to contrast load. She will need lopressor prior  to CT coronary as her HR is in the 80-90s today.   Essential hypertension Procardia. Pressures have been running 130/80s at home. No medication changes today.   Cerebral aneurysm On 325 mg ASA and plavix per neurology. Pressures are controlled.    Medication Adjustments/Labs and Tests Ordered: Current medicines are reviewed at length with the patient today.  Concerns regarding medicines are outlined above.  Orders Placed This Encounter  Procedures  . CT CORONARY MORPH W/CTA COR W/SCORE W/CA W/CM &/OR WO/CM  . CT CORONARY FRACTIONAL FLOW RESERVE DATA PREP  . CT CORONARY FRACTIONAL FLOW RESERVE FLUID ANALYSIS  . Lipid panel  . Hepatic function panel  . CBC  . Basic metabolic panel  . Hemoglobin A1c  . EKG 12-Lead   Meds ordered this encounter  Medications  . metoprolol tartrate (LOPRESSOR) 50 MG tablet    Sig: Take 1 tablet (50 mg total) by mouth once for 1 dose. Take one hour before procedure    Dispense:  1 tablet    Refill:  0    Signed, Marcelino Duster, [name]  09/20/2017 9:04 AM    [facility]

## 2017-09-20 ENCOUNTER — Ambulatory Visit (INDEPENDENT_AMBULATORY_CARE_PROVIDER_SITE_OTHER): Payer: 59 | Admitting: Physician Assistant

## 2017-09-20 ENCOUNTER — Encounter: Payer: Self-pay | Admitting: Cardiology

## 2017-09-20 VITALS — BP 128/96 | HR 88 | Ht 64.0 in | Wt 186.8 lb

## 2017-09-20 DIAGNOSIS — I671 Cerebral aneurysm, nonruptured: Secondary | ICD-10-CM

## 2017-09-20 DIAGNOSIS — I10 Essential (primary) hypertension: Secondary | ICD-10-CM

## 2017-09-20 DIAGNOSIS — R079 Chest pain, unspecified: Secondary | ICD-10-CM | POA: Diagnosis not present

## 2017-09-20 MED ORDER — METOPROLOL TARTRATE 50 MG PO TABS: 50.0000 mg | ORAL_TABLET | Freq: Once | ORAL | 0 refills | Status: DC

## 2017-09-20 NOTE — Patient Instructions (Addendum)
Medication Instructions:   Your physician recommends that you continue on your current medications as directed. Please refer to the Current Medication list given to you today.   If you need a refill on your cardiac medications before your next appointment, please call your pharmacy.  Labwork:  LIPIDS  LFT  A1C  CBC BMET    Testing/Procedures:   Non-Cardiac CT scanning, (CAT scanning), is a noninvasive, special x-ray that produces cross-sectional images of the body using x-rays and a computer. CT scans help physicians diagnose and treat medical conditions. For some CT exams, a contrast material is used to enhance visibility in the area of the body being studied. CT scans provide greater clarity and reveal more details than regular x-ray exams.    Follow-Up: Your physician wants you to follow-up in:  IN  6  MONTHS WITH DR Marlou Starks will receive a reminder letter in the mail two months in advance. If you don't receive a letter, please call our office to schedule the follow-up appointment.      Any Other Special Instructions Will Be Listed Below (If Applicable).  Please arrive at the [facility] main entrance of [facility] at  (30-45 minutes prior to test start time)  [facility] [address] [place], [place] [address] [phone]  Proceed to the [facility] Radiology Department (First Floor).  Please follow these instructions carefully (unless otherwise directed):  Hold all erectile dysfunction medications at least 48 hours prior to test.  On the Night Before the Test: . Drink plenty of water. . Do not consume any caffeinated/decaffeinated beverages or chocolate 12 hours prior to your test. . Do not take any antihistamines 12 hours prior to your test. . If you take Metformin do not take 24 hours prior to test. . If the patient has contrast allergy: ? Patient will need a prescription for Prednisone and very clear instructions (as follows): 1. Prednisone  50 mg - take 13 hours prior to test 2. Take another Prednisone 50 mg 7 hours prior to test 3. Take another Prednisone 50 mg 1 hour prior to test 4. Take Benadryl 50 mg 1 hour prior to test . Patient must complete all four doses of above prophylactic medications. . Patient will need a ride after test due to Benadryl.  On the Day of the Test: . Drink plenty of water. Do not drink any water within one hour of the test. . Do not eat any food 4 hours prior to the test. . You may take your regular medications prior to the test. . IF NOT ON A BETA BLOCKER - Take 50 mg of lopressor (metoprolol) one hour before the test. . HOLD Furosemide morning of the test.  After the Test: . Drink plenty of water. . After receiving IV contrast, you may experience a mild flushed feeling. This is normal. . On occasion, you may experience a mild rash up to 24 hours after the test. This is not dangerous. If this occurs, you can take Benadryl 25 mg and increase your fluid intake. . If you experience trouble breathing, this can be serious. If it is severe call 911 IMMEDIATELY. If it is mild, please call our office. . If you take any of these medications: Glipizide/Metformin, Avandament, Glucavance, please do not take 48 hours after completing test.

## 2017-09-21 ENCOUNTER — Other Ambulatory Visit: Payer: Self-pay | Admitting: *Deleted

## 2017-09-21 DIAGNOSIS — I10 Essential (primary) hypertension: Secondary | ICD-10-CM

## 2017-09-21 DIAGNOSIS — Z79899 Other long term (current) drug therapy: Secondary | ICD-10-CM

## 2017-09-21 DIAGNOSIS — E7800 Pure hypercholesterolemia, unspecified: Secondary | ICD-10-CM

## 2017-09-21 LAB — BASIC METABOLIC PANEL
BUN / CREAT RATIO: 14 (ref 9–23)
BUN: 12 mg/dL (ref 6–24)
CO2: 23 mmol/L (ref 20–29)
Calcium: 10.8 mg/dL — ABNORMAL HIGH (ref 8.7–10.2)
Chloride: 100 mmol/L (ref 96–106)
Creatinine, Ser: 0.87 mg/dL (ref 0.57–1.00)
GFR, EST AFRICAN AMERICAN: 96 mL/min/{1.73_m2} (ref >59)
GFR, EST NON AFRICAN AMERICAN: 83 mL/min/{1.73_m2} (ref >59)
Glucose: 94 mg/dL (ref 65–99)
POTASSIUM: 4.4 mmol/L (ref 3.5–5.2)
SODIUM: 138 mmol/L (ref 134–144)

## 2017-09-21 LAB — CBC
HEMATOCRIT: 36.4 % (ref 34.0–46.6)
Hemoglobin: 12.4 g/dL (ref 11.1–15.9)
MCH: 27.9 pg (ref 26.6–33.0)
MCHC: 34.1 g/dL (ref 31.5–35.7)
MCV: 82 fL (ref 79–97)
Platelets: 368 10*3/uL (ref 150–379)
RBC: 4.44 x10E6/uL (ref 3.77–5.28)
RDW: 14.4 % (ref 12.3–15.4)
WBC: 5.3 10*3/uL (ref 3.4–10.8)

## 2017-09-21 LAB — HEPATIC FUNCTION PANEL
ALT: 27 IU/L (ref 0–32)
AST: 18 IU/L (ref 0–40)
Albumin: 5 g/dL (ref 3.5–5.5)
Alkaline Phosphatase: 102 IU/L (ref 39–117)
BILIRUBIN, DIRECT: 0.08 mg/dL (ref 0.00–0.40)
TOTAL PROTEIN: 8.3 g/dL (ref 6.0–8.5)

## 2017-09-21 LAB — LIPID PANEL
CHOL/HDL RATIO: 3.9 ratio (ref 0.0–4.4)
Cholesterol, Total: 256 mg/dL — ABNORMAL HIGH (ref 100–199)
HDL: 66 mg/dL (ref >39)
LDL Calculated: 160 mg/dL — ABNORMAL HIGH (ref 0–99)
Triglycerides: 151 mg/dL — ABNORMAL HIGH (ref 0–149)
VLDL Cholesterol Cal: 30 mg/dL (ref 5–40)

## 2017-09-21 LAB — HEMOGLOBIN A1C
Est. average glucose Bld gHb Est-mCnc: 111 mg/dL
Hgb A1c MFr Bld: 5.5 % (ref 4.8–5.6)

## 2017-09-21 MED ORDER — ROSUVASTATIN CALCIUM 20 MG PO TABS: 20.0000 mg | ORAL_TABLET | Freq: Every day | ORAL | 3 refills | Status: DC

## 2017-10-05 ENCOUNTER — Other Ambulatory Visit: Payer: Self-pay

## 2017-10-05 ENCOUNTER — Telehealth: Payer: Self-pay | Admitting: Physician Assistant

## 2017-10-05 DIAGNOSIS — E785 Hyperlipidemia, unspecified: Secondary | ICD-10-CM

## 2017-10-05 NOTE — Telephone Encounter (Signed)
Please ask her to stop taking this medication and get her an appt in the lipid clinic. If she is having an allergic reaction to a statin, I hesitate to put her on a different statin.

## 2017-10-05 NOTE — Telephone Encounter (Signed)
New message   Pt c/o medication issue:  1. Name of Medication: rosuvastatin (CRESTOR) 20 MG tablet  2. How are you currently taking this medication (dosage and times per day)?   3. Are you having a reaction (difficulty breathing--STAT)? Lip swelling  4. What is your medication issue? Patient thinks medication may be causing a reaction

## 2017-10-05 NOTE — Telephone Encounter (Signed)
Spoke with patient who noticed some physical reactions since starting rosuvastatin on 5/17.    On 5/24, she noticed a large, hard lump on her L forearm and now recently the past 2 days she has noticed lip swelling about 2-4 hours after taking the medication.  She said that this medication is the most recently started on.  Please advise, thank you

## 2017-10-24 NOTE — Progress Notes (Signed)
Patient ID: Anita Griffithequila C Tedder                 DOB: [birth year]                    MRN: [id number]     HPI: Anita Rhodes is a 42 y.o. female patient of Dr Katrinka BlazingSmith referred to lipid clinic by Bettina GaviaAngie Duke, PA. 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 She was hospitalized 07/2017 for planned treatment of her blister aneurysms. She was seen in clinic last month with complaints of chest pain at rest.   Pt started rosuvastatin 20mg  daily on 5/17. On 5/24, she noticed a large, hard lump on her left forearm. On 5/29, she noticed lip swelling 2-4 hours after taking his rosuvastatin. She stopped taking her rosuvastatin on 5/31. Pt presents today and reports that her symptoms have continued over the past 3 weeks. She has had constant swelling in her lips and has had about 7 hives on her legs that are hot and mildly itchy/painful. Each hive lasts for a few days. Denies tongue swelling or trouble breathing, however reports it was more difficult to swallow her aspirin this morning. She has taken 1 Benadryl each day and had a few leftover codeines that she has taken. Her mom does have hives with codeine, however pt states she has taken this many times in the past and has not had any reactions.  She went to her PCP and states they ran labs but did not know the cause of her allergic reaction. She was referred to dermatology but they do not have an opening for another month.   In reviewing her medications, she started nifedipine back in February 2019. She has been taking aspirin for the last 5 years and has been on Plavix as far back as 5 years as well. The only other medication she takes is FIoricet for headaches occasionally. No new detergents or foods in the past month.  Family History: The patient's family history includes Aneurysm in her father; Hyperlipidemia in her father; Hypertension in her  mother, other, and paternal grandfather; Stroke in her father.  Social History: Denies tobacco, alcohol, and illicit drug use.  Past Medical History:  Diagnosis Date  . Asthma   . Brain aneurysm   . Family history of brain aneurysm   . Frequent headaches   . Hypertension   . Wears glasses     Current Outpatient Medications on File Prior to Visit  Medication Sig Dispense Refill  . albuterol (PROVENTIL HFA;VENTOLIN HFA) 108 (90 BASE) MCG/ACT inhaler Inhale 2 puffs into the lungs every 6 (six) hours as needed for wheezing or shortness of breath.    Marland Kitchen. aspirin 325 MG tablet Take 1 tablet (325 mg total) by mouth daily with breakfast. 30 tablet 0  . butalbital-acetaminophen-caffeine (FIORICET WITH CODEINE) 50-325-40-30 MG per capsule Take 1 capsule by mouth every 6 (six) hours as needed for headache.     . clopidogrel (PLAVIX) 75 MG tablet Take 75 mg by mouth daily.  3  . HYDROcodone-acetaminophen (NORCO/VICODIN) 5-325 MG tablet Take 1 tablet by mouth every 4 (four) hours as needed. 10 tablet 0  . Ibuprofen-Diphenhydramine HCl (ADVIL PM) 200-25 MG CAPS Take 1 tablet by mouth at bedtime as needed (for sleep/headaches.).    Marland Kitchen. metoprolol tartrate (LOPRESSOR) 50 MG tablet Take 1 tablet (50  mg total) by mouth once for 1 dose. Take one hour before procedure 1 tablet 0  . Multiple Vitamin (MULTIVITAMIN WITH MINERALS) TABS tablet Take 1 tablet by mouth daily.    Marland Kitchen NIFEdipine (PROCARDIA XL/ADALAT-CC) 90 MG 24 hr tablet Take 90 mg by mouth daily.  1   Current Facility-Administered Medications on File Prior to Visit  Medication Dose Route Frequency Provider Last Rate Last Dose  . LORazepam (ATIVAN) tablet 1 mg  1 mg Oral Once Berneta Levins, PA-C        Allergies  Allergen Reactions  . Banana Itching and Rash  . Fish-Derived Products Itching and Rash    Assessment/Plan:  1. Allergic reaction - Pt has had angioedema over the past month with lip swelling every day and 7-8 hives on her legs. She  stopped taking her rosuvastatin 3 weeks ago as this was started 2 weeks prior to symptom onset, however there was no resolution. No new foods or detergents used. Will stop nifedipine as this can be associated with angioedema, although reported incidence is < 1%. Discussed with DOD Dr End and advised pt to use antihistamines and OTC pain medication to help with symptoms. He preferred PCP to prescribe oral steroids if needed. If nifedipine is the cause, symptoms should start to improve within a few days. I do not believe Crestor was the cause of patient's allergic reaction since her symptoms have continued despite discontinuing statin 3 weeks ago. Pt plans to follow up with her PCP today as well. Pt aware to go to the ER if swelling worsens or breathing becomes compromised.  2. Hypertension - BP elevated today. Ideally did not want to start new medication today since we are unsure of cause of patient's angioedema, however in stopping patient's high dose nifedipine and with history of 2 aneurysms, will need to maintain appropriate BP control. Will start chlorthalidone 25mg  daily with BP and BMET check in 2 weeks.  Will follow up in pharmacy clinic in 2 weeks. Unable to discuss lipids (reason for referral today) due to acute allergic reaction. Will discuss lipids, HTN, and medication tolerability at next visit.   Shekita Boyden E. Ishita Mcnerney, PharmD, BCACP, CPP  [facility] [address], [place], [place] [address] Phone: [phone]; Fax: [phone] 10/25/2017 9:16 AM

## 2017-10-25 ENCOUNTER — Ambulatory Visit (INDEPENDENT_AMBULATORY_CARE_PROVIDER_SITE_OTHER): Payer: 59 | Admitting: Pharmacist

## 2017-10-25 ENCOUNTER — Encounter: Payer: Self-pay | Admitting: Pharmacist

## 2017-10-25 ENCOUNTER — Emergency Department (HOSPITAL_COMMUNITY)
Admission: EM | Admit: 2017-10-25 | Discharge: 2017-10-25 | Disposition: A | Discharge status: Home / Self Care | Payer: 59 | Attending: Emergency Medicine | Admitting: Emergency Medicine

## 2017-10-25 ENCOUNTER — Encounter (HOSPITAL_COMMUNITY): Payer: Self-pay | Admitting: Emergency Medicine

## 2017-10-25 DIAGNOSIS — I10 Essential (primary) hypertension: Secondary | ICD-10-CM | POA: Diagnosis not present

## 2017-10-25 DIAGNOSIS — E785 Hyperlipidemia, unspecified: Secondary | ICD-10-CM | POA: Insufficient documentation

## 2017-10-25 DIAGNOSIS — Z7901 Long term (current) use of anticoagulants: Secondary | ICD-10-CM | POA: Diagnosis not present

## 2017-10-25 DIAGNOSIS — T7840XA Allergy, unspecified, initial encounter: Secondary | ICD-10-CM | POA: Diagnosis present

## 2017-10-25 DIAGNOSIS — Z79899 Other long term (current) drug therapy: Secondary | ICD-10-CM | POA: Diagnosis not present

## 2017-10-25 DIAGNOSIS — E782 Mixed hyperlipidemia: Secondary | ICD-10-CM | POA: Diagnosis not present

## 2017-10-25 DIAGNOSIS — J45909 Unspecified asthma, uncomplicated: Secondary | ICD-10-CM | POA: Diagnosis not present

## 2017-10-25 DIAGNOSIS — T783XXA Angioneurotic edema, initial encounter: Secondary | ICD-10-CM | POA: Diagnosis not present

## 2017-10-25 DIAGNOSIS — I671 Cerebral aneurysm, nonruptured: Secondary | ICD-10-CM | POA: Insufficient documentation

## 2017-10-25 MED ORDER — PREDNISONE 20 MG PO TABS: 60.0000 mg | ORAL_TABLET | Freq: Every day | ORAL | 0 refills | Status: AC

## 2017-10-25 MED ORDER — METHYLPREDNISOLONE SODIUM SUCC 125 MG IJ SOLR
125.0000 mg | Freq: Once | INTRAMUSCULAR | Status: AC
  Administered 2017-10-25: 125 mg via INTRAVENOUS
  Filled 2017-10-25: qty 2

## 2017-10-25 MED ORDER — LORATADINE 10 MG PO TABS
10.0000 mg | ORAL_TABLET | Freq: Once | ORAL | Status: AC
  Administered 2017-10-25: 10 mg via ORAL
  Filled 2017-10-25: qty 1

## 2017-10-25 MED ORDER — FAMOTIDINE 20 MG PO TABS
40.0000 mg | ORAL_TABLET | Freq: Once | ORAL | Status: AC
  Administered 2017-10-25: 40 mg via ORAL
  Filled 2017-10-25: qty 2

## 2017-10-25 MED ORDER — FAMOTIDINE 40 MG PO TABS: 40.0000 mg | ORAL_TABLET | Freq: Every day | ORAL | 0 refills | Status: DC

## 2017-10-25 MED ORDER — CETIRIZINE HCL 10 MG PO TABS: 10.0000 mg | ORAL_TABLET | Freq: Every day | ORAL | 0 refills | Status: DC

## 2017-10-25 MED ORDER — CHLORTHALIDONE 25 MG PO TABS: 25.0000 mg | ORAL_TABLET | Freq: Every day | ORAL | 11 refills | Status: DC

## 2017-10-25 MED ORDER — EPINEPHRINE 0.3 MG/0.3ML IJ SOAJ: 0.3000 mg | Freq: Once | INTRAMUSCULAR | 0 refills | Status: AC

## 2017-10-25 NOTE — ED Provider Notes (Signed)
[facility]-EMERGENCY DEPT Provider Note   CSN: [id number] Arrival date & time: 10/25/17  7253     History   Chief Complaint Chief Complaint  Patient presents with  . Allergic Reaction    HPI Anita Rhodes is a 42 y.o. female.  HPI   Patient is a 42 year old female history of brain aneurysm, hypertension, hyperlipidemia who presents emergency department today to be evaluated for an allergic reaction.  Patient states that for the last several weeks she has had hive-like rash to her bilateral lower extremities that has been intermittent.  She also reports intermittent angioedema.  States that this is happened twice over the past 2 weeks.  States that she started to have lower lip swelling last night.  She took a Benadryl and went to bed and this morning it seems to have improved.  She denies any wheezing, shortness of breath, chest pain, nausea, vomiting, diarrhea or abdominal pain.  She denies any throat swelling or tongue swelling.  She denies any new soaps, detergents, lotions, perfumes, new pets, or new job recently.  Denies any new medications.  Of note she states she was seen by her PCP for similar complaints and had lab work drawn several weeks ago.  Initially her symptoms were thought to be due to her Crestor as that is the only new medication she had started.  Crestor was discontinued and she has still had symptoms.  She was referred to a dermatologist but cannot get in for at least 1 month.  She was also seen by her cardiologist earlier today who went through her med list and determined that the only other medication that could be contributing to her symptoms could be her nifedipine.  Her cardiologist discontinued her nifedipine today.  Past Medical History:  Diagnosis Date  . Asthma   . Brain aneurysm   . Family history of brain aneurysm   . Frequent headaches   . Hypertension   . Wears glasses     Patient Active Problem List   Diagnosis Date Noted    . Hyperlipidemia 10/25/2017  . Hypertension 10/25/2017  . Angioedema 10/25/2017  . Acute chest pain   . Brain aneurysm 07/30/2017  . Increased anion gap metabolic acidosis 10/22/2012  . Asthma 10/22/2012  . New onset of headaches 10/22/2012  . Subarachnoid hemorrhage (HCC) 10/22/2012  . Respiratory failure, post-operative (HCC) 10/22/2012    Past Surgical History:  Procedure Laterality Date  . arthroscopic knee    . IR 3D INDEPENDENT WKST  05/22/2017  . IR ANGIO INTRA EXTRACRAN SEL COM CAROTID INNOMINATE BILAT MOD SED  05/22/2017  . IR ANGIO INTRA EXTRACRAN SEL INTERNAL CAROTID UNI R MOD SED  07/30/2017  . IR ANGIO VERTEBRAL SEL SUBCLAVIAN INNOMINATE UNI R MOD SED  07/30/2017  . IR ANGIO VERTEBRAL SEL VERTEBRAL BILAT MOD SED  05/22/2017  . IR ANGIOGRAM FOLLOW UP STUDY  07/30/2017  . IR RADIOLOGIST EVAL & MGMT  08/10/2017  . IR TRANSCATH/EMBOLIZ  07/30/2017  . OTHER SURGICAL HISTORY     aneurysm repair.  Marland Kitchen RADIOLOGY WITH ANESTHESIA N/A 06/27/2017   Procedure: RADIOLOGY WITH ANESTHESIA, EMBOLIZATION;  Surgeon: Julieanne Cotton, MD;  Location: [facility] OR;  Service: Radiology;  Laterality: N/A;  . RADIOLOGY WITH ANESTHESIA N/A 07/30/2017   Procedure: EMBOLIZATION;  Surgeon: Julieanne Cotton, MD;  Location: [facility] OR;  Service: Radiology;  Laterality: N/A;     OB History   None      Home Medications    Prior to  Admission medications   Medication Sig Start Date End Date Taking? Authorizing Provider  albuterol (PROVENTIL HFA;VENTOLIN HFA) 108 (90 BASE) MCG/ACT inhaler Inhale 2 puffs into the lungs every 6 (six) hours as needed for wheezing or shortness of breath.   Yes [provider]  butalbital-acetaminophen-caffeine (FIORICET WITH CODEINE) 50-325-40-30 MG per capsule Take 1 capsule by mouth every 6 (six) hours as needed for headache.  04/02/13  Yes [provider]  chlorthalidone (HYGROTON) 25 MG tablet Take 1 tablet (25 mg total) by mouth daily. 10/25/17 01/23/18 Yes Duke,  Roe Rutherford, PA  clopidogrel (PLAVIX) 75 MG tablet Take 75 mg by mouth daily. 06/17/17  Yes [provider]  Multiple Vitamin (MULTIVITAMIN WITH MINERALS) TABS tablet Take 1 tablet by mouth daily.   Yes [provider]  aspirin 325 MG tablet Take 1 tablet (325 mg total) by mouth daily with breakfast. Patient not taking: Reported on 10/25/2017 11/02/12   Colon Branch, MD  cetirizine (ZYRTEC) 10 MG tablet Take 1 tablet (10 mg total) by mouth daily. 10/25/17   Lorel Lembo S, PA-C  EPINEPHrine 0.3 mg/0.3 mL IJ SOAJ injection Inject 0.3 mLs (0.3 mg total) into the muscle once for 1 dose. 10/25/17 10/25/17  Aerilyn Slee S, PA-C  famotidine (PEPCID) 40 MG tablet Take 1 tablet (40 mg total) by mouth daily for 7 days. 10/25/17 11/01/17  Gatsby Chismar S, PA-C  predniSONE (DELTASONE) 20 MG tablet Take 3 tablets (60 mg total) by mouth daily with breakfast for 7 days. 10/25/17 11/01/17  Anjelita Sheahan S, PA-C    Family History Family History  Problem Relation Age of Onset  . Hyperlipidemia Father   . Aneurysm Father   . Stroke Father   . Hypertension Mother   . Hypertension Other   . Hypertension Paternal Grandfather     Social History Social History   Tobacco Use  . Smoking status: Never Smoker  . Smokeless tobacco: Never Used  Substance Use Topics  . Alcohol use: Yes    Comment: social  . Drug use: No     Allergies   Banana and Fish-derived products   Review of Systems Review of Systems  Constitutional: Negative for fever.  HENT: Negative for rhinorrhea, sore throat and trouble swallowing.        Lower lip swelling. No tongue swelling.  Eyes: Negative for visual disturbance.  Respiratory: Negative for shortness of breath and wheezing.   Cardiovascular: Negative for chest pain.  Gastrointestinal: Negative for abdominal pain, constipation, diarrhea, nausea and vomiting.  Genitourinary: Negative for flank pain, frequency and urgency.  Musculoskeletal: Negative  for back pain.  Skin: Positive for rash.  Neurological: Negative for headaches.   Physical Exam Updated Vital Signs BP (!) 142/100   Pulse 91   Temp 98.7 F (37.1 C) (Oral)   Resp 14   SpO2 100%   Physical Exam  Constitutional: She appears well-developed and well-nourished. No distress.  HENT:  Head: Normocephalic and atraumatic.  Lower lip appears swollen. Upper lip is WNL. No tongue swelling or posterior pharyngeal swelling. Airway is patent and pt has normal voice. Tolerating secretions.   Eyes: Pupils are equal, round, and reactive to light. Conjunctivae and EOM are normal.  Neck: Normal range of motion. Neck supple.  Cardiovascular: Normal rate, regular rhythm, normal heart sounds and intact distal pulses.  No murmur heard. Pulmonary/Chest: Effort normal and breath sounds normal. No stridor. No respiratory distress. She has no wheezes.  Abdominal: Soft. Bowel sounds are normal. She  exhibits no distension. There is no tenderness. There is no guarding.  Musculoskeletal: She exhibits no edema.  Neurological: She is alert.  Skin: Skin is warm and dry. Capillary refill takes less than 2 seconds. Rash noted.  hive like rash to BLE that is TTP.  Psychiatric:  anxious  Nursing note and vitals reviewed.  ED Treatments / Results  Labs (all labs ordered are listed, but only abnormal results are displayed) Labs Reviewed - No data to display  EKG None  Radiology No results found.  Procedures Procedures (including critical care time)  Medications Ordered in ED Medications  famotidine (PEPCID) tablet 40 mg (40 mg Oral Given 10/25/17 1036)  loratadine (CLARITIN) tablet 10 mg (10 mg Oral Given 10/25/17 1036)  methylPREDNISolone sodium succinate (SOLU-MEDROL) 125 mg/2 mL injection 125 mg (125 mg Intravenous Given 10/25/17 1037)     Initial Impression / Assessment and Plan / ED Course  I have reviewed the triage vital signs and the nursing notes.  Pertinent labs & imaging  results that were available during my care of the patient were reviewed by me and considered in my medical decision making (see chart for details).     Final Clinical Impressions(s) / ED Diagnoses   Final diagnoses:  Allergic reaction, initial encounter   Patient re-evaluated prior to dc, is hemodynamically stable, in no respiratory distress, and denies the feeling of throat closing. She feels that her rash has improved after medications. She denies any lip or tongue swelling. No difficulty swallowing or breathing. Has tolerated po in the department. she is in NAD upon discharge. Pt has been advised to take zyrtec, pepcid, and prednisone & return to the ED if they have a mod-severe allergic rxn (s/s including throat closing, difficulty breathing, swelling of lips face or tongue). Will give rx for epi-pen. Discussed indications for use and that she needs to go immediately to the ed if she needs to use it. Pt is to follow up with their PCP. Also gave referral to allergy clinic for skin testing as her trigger has not yet been determined. Pt is agreeable with plan & verbalizes understanding. All questions answered and pt stable for discharge.    ED Discharge Orders        Ordered    famotidine (PEPCID) 40 MG tablet  Daily     10/25/17 1257    cetirizine (ZYRTEC) 10 MG tablet  Daily     10/25/17 1257    predniSONE (DELTASONE) 20 MG tablet  Daily with breakfast     10/25/17 1257    EPINEPHrine 0.3 mg/0.3 mL IJ SOAJ injection   Once     10/25/17 1257       Latayvia Mandujano S, PA-C 10/25/17 1259    Pricilla LovelessGoldston, Scott, MD 10/27/17 [phone]

## 2017-10-25 NOTE — ED Triage Notes (Addendum)
Patient here from home with complaints of allergic reaction x3 week. Reports that she does not know what she is allergic to. Primary dr thought that it may be the Procardia. States that she feels like her lips are swelling and has rash to legs.

## 2017-10-25 NOTE — Patient Instructions (Signed)
Stop taking your nifedipine - monitor your hives and your lips for swelling improvement  Start taking chlorthalidone 25mg  daily for your cholesterol  You can take ibuprofen as needed for inflammation and pain - try not to take too much since you are taking aspirin and Plavix and the ibuprofen can thin your blood   Use antihistamines as needed - Benadryl, Claritin, Zyrtec, or Allegra are all ok to take  If your symptoms do not improve within a few days, please call back to your primary care doctor. They may want to start a steroid medicine like prednisone  If you notice that your tongue starts to swell or you have any trouble breathing, go to the ER  Follow up in clinic in 2 weeks

## 2017-10-25 NOTE — Discharge Instructions (Signed)
You were given a prescription for prednisone.  Please take 60 mg daily with breakfast for the next 7 days.  You were also given a prescription for Zyrtec which she should take daily as well.  Please also take 40 mg of Pepcid daily. You were also given a prescription for an epi-pen. If you feel like your throat is swelling, you are having difficulty swallowing or breathing then you may use the epi-pen however you will need to be seen in the emergency department immediately after use.   Please follow up with your primary care provider within 5-7 days for re-evaluation of your symptoms.   You were also given a referral to an allergist.  Please make an appointment within the next 1 to 2 weeks to be seen.  Please return to the emergency department for any new or worsening symptoms.

## 2017-11-15 ENCOUNTER — Ambulatory Visit: Payer: 59 | Admitting: Pharmacist

## 2017-11-15 NOTE — Progress Notes (Deleted)
Patient ID: Anita Rhodes                 DOB: 1976-03-15                    MRN: [id number]     HPI: Anita Rhodes is a 42 y.o. female patient of Dr Katrinka Blazing referred to lipid clinic by Anita Gavia, PA. 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 She was hospitalized 07/2017 for planned treatment of her blister aneurysms. She was seen in clinic last month with complaints of chest pain at rest.   Lip swelling and hard lumps better? Tolerating chlorthalidone ok? Did she ever have coronary CT?  Needs BMET Start lipitor 10   Pt started rosuvastatin 20mg  daily on 5/17. On 5/24, she noticed a large, hard lump on her left forearm. On 5/29, she noticed lip swelling 2-4 hours after taking his rosuvastatin. She stopped taking her rosuvastatin on 5/31. Pt presents today and reports that her symptoms have continued over the past 3 weeks. She has had constant swelling in her lips and has had about 7 hives on her legs that are hot and mildly itchy/painful. Each hive lasts for a few days. Denies tongue swelling or trouble breathing, however reports it was more difficult to swallow her aspirin this morning. She has taken 1 Benadryl each day and had a few leftover codeines that she has taken. Her mom does have hives with codeine, however pt states she has taken this many times in the past and has not had any reactions.  She went to her PCP and states they ran labs but did not know the cause of her allergic reaction. She was referred to dermatology but they do not have an opening for another month.   In reviewing her medications, she started nifedipine back in February 2019. She has been taking aspirin for the last 5 years and has been on Plavix as far back as 5 years as well. The only other medication she takes is FIoricet for headaches occasionally. No new detergents or  foods in the past month.  HTN medications: chlorthalidone 25mg  daily Previously tried: nifedipine - ? Correlation with angioedema BP goal: < 130/13mmHg  Lipid meds: none Previously tried: rosuvastatin 20mg  - ? Correlation with angioedema LDL goal: < 100mg /dL for family history of early CAD  Family History: The patient's family history includes Aneurysm in her father; Hyperlipidemia in her father; Hypertension in her mother, other, and paternal grandfather; Stroke in her father.  Social History: Denies tobacco, alcohol, and illicit drug use.  Past Medical History:  Diagnosis Date  . Asthma   . Brain aneurysm   . Family history of brain aneurysm   . Frequent headaches   . Hypertension   . Wears glasses     Current Outpatient Medications on File Prior to Visit  Medication Sig Dispense Refill  . albuterol (PROVENTIL HFA;VENTOLIN HFA) 108 (90 BASE) MCG/ACT inhaler Inhale 2 puffs into the lungs every 6 (six) hours as needed for wheezing or shortness of breath.    Marland Kitchen aspirin 325 MG tablet Take 1 tablet (325 mg total) by mouth daily with breakfast. (Patient not taking: Reported on 10/25/2017) 30 tablet 0  . butalbital-acetaminophen-caffeine (FIORICET WITH CODEINE) 50-325-40-30 MG per capsule Take 1 capsule by mouth every 6 (six) hours as needed for  headache.     . cetirizine (ZYRTEC) 10 MG tablet Take 1 tablet (10 mg total) by mouth daily. 30 tablet 0  . chlorthalidone (HYGROTON) 25 MG tablet Take 1 tablet (25 mg total) by mouth daily. 30 tablet 11  . clopidogrel (PLAVIX) 75 MG tablet Take 75 mg by mouth daily.  3  . famotidine (PEPCID) 40 MG tablet Take 1 tablet (40 mg total) by mouth daily for 7 days. 7 tablet 0  . Multiple Vitamin (MULTIVITAMIN WITH MINERALS) TABS tablet Take 1 tablet by mouth daily.     No current facility-administered medications on file prior to visit.     Allergies  Allergen Reactions  . Banana Itching and Rash  . Fish-Derived Products Itching and Rash     Assessment/Plan:  1. Allergic reaction - Pt has had angioedema over the past month with lip swelling every day and 7-8 hives on her legs. She stopped taking her rosuvastatin 3 weeks ago as this was started 2 weeks prior to symptom onset, however there was no resolution. No new foods or detergents used. Will stop nifedipine as this can be associated with angioedema, although reported incidence is < 1%. Discussed with DOD Dr End and advised pt to use antihistamines and OTC pain medication to help with symptoms. He preferred PCP to prescribe oral steroids if needed. If nifedipine is the cause, symptoms should start to improve within a few days. I do not believe Crestor was the cause of patient's allergic reaction since her symptoms have continued despite discontinuing statin 3 weeks ago. Pt plans to follow up with her PCP today as well. Pt aware to go to the ER if swelling worsens or breathing becomes compromised.  2. Hypertension - BP elevated today. Ideally did not want to start new medication today since we are unsure of cause of patient's angioedema, however in stopping patient's high dose nifedipine and with history of 2 aneurysms, will need to maintain appropriate BP control. Will start chlorthalidone 25mg  daily with BP and BMET check in 2 weeks.  Will follow up in pharmacy clinic in 2 weeks. Unable to discuss lipids (reason for referral today) due to acute allergic reaction. Will discuss lipids, HTN, and medication tolerability at next visit.   Megan E. Supple, PharmD, BCACP, CPP [facility] [address], [place], [place] [address] Phone: [phone]; Fax: [phone] 11/15/2017 7:11 AM

## 2017-11-26 ENCOUNTER — Telehealth (HOSPITAL_COMMUNITY): Payer: Self-pay

## 2017-11-26 NOTE — Telephone Encounter (Signed)
Called to schedule f/u cta head/neck, no answer, left vm. AW 

## 2017-11-29 ENCOUNTER — Other Ambulatory Visit (HOSPITAL_COMMUNITY): Payer: Self-pay | Admitting: Interventional Radiology

## 2017-11-29 DIAGNOSIS — I729 Aneurysm of unspecified site: Secondary | ICD-10-CM

## 2017-12-06 ENCOUNTER — Ambulatory Visit (HOSPITAL_COMMUNITY)
Admission: RE | Admit: 2017-12-06 | Discharge: 2017-12-06 | Disposition: A | Discharge status: Home / Self Care | Payer: 59 | Source: Ambulatory Visit | Attending: Interventional Radiology | Admitting: Interventional Radiology

## 2017-12-06 ENCOUNTER — Encounter (HOSPITAL_COMMUNITY): Payer: Self-pay

## 2017-12-06 DIAGNOSIS — I729 Aneurysm of unspecified site: Secondary | ICD-10-CM | POA: Diagnosis not present

## 2017-12-06 MED ORDER — IOPAMIDOL (ISOVUE-370) INJECTION 76%
INTRAVENOUS | Status: AC
  Filled 2017-12-06: qty 50

## 2017-12-06 MED ORDER — IOPAMIDOL (ISOVUE-370) INJECTION 76%
50.0000 mL | Freq: Once | INTRAVENOUS | Status: AC | PRN
  Administered 2017-12-06: 50 mL via INTRAVENOUS

## 2017-12-10 ENCOUNTER — Telehealth (HOSPITAL_COMMUNITY): Payer: Self-pay

## 2017-12-10 NOTE — Telephone Encounter (Signed)
Pt agreed to f/u in 1 year with cta head and neck. She wanted to know if she could come off of her blood thinner. I told her I would ask Dr. Corliss Skainseveshwar and give her a call back. AW

## 2017-12-11 ENCOUNTER — Other Ambulatory Visit: Payer: Self-pay | Admitting: Physician Assistant

## 2017-12-12 ENCOUNTER — Other Ambulatory Visit: Payer: 59 | Admitting: *Deleted

## 2017-12-12 DIAGNOSIS — E7800 Pure hypercholesterolemia, unspecified: Secondary | ICD-10-CM

## 2017-12-12 DIAGNOSIS — Z79899 Other long term (current) drug therapy: Secondary | ICD-10-CM

## 2017-12-12 DIAGNOSIS — I10 Essential (primary) hypertension: Secondary | ICD-10-CM

## 2017-12-12 LAB — BASIC METABOLIC PANEL
BUN/Creatinine Ratio: 11 (ref 9–23)
BUN: 9 mg/dL (ref 6–24)
CALCIUM: 10.4 mg/dL — AB (ref 8.7–10.2)
CO2: 22 mmol/L (ref 20–29)
Chloride: 100 mmol/L (ref 96–106)
Creatinine, Ser: 0.79 mg/dL (ref 0.57–1.00)
GFR calc Af Amer: 108 mL/min/{1.73_m2} (ref >59)
GFR calc non Af Amer: 93 mL/min/{1.73_m2} (ref >59)
Glucose: 110 mg/dL — ABNORMAL HIGH (ref 65–99)
POTASSIUM: 3.4 mmol/L — AB (ref 3.5–5.2)
Sodium: 138 mmol/L (ref 134–144)

## 2017-12-12 LAB — HEPATIC FUNCTION PANEL
ALK PHOS: 87 IU/L (ref 39–117)
ALT: 22 IU/L (ref 0–32)
AST: 16 IU/L (ref 0–40)
Albumin: 4.4 g/dL (ref 3.5–5.5)
Bilirubin Total: <0.2 mg/dL (ref 0.0–1.2)
Bilirubin, Direct: 0.07 mg/dL (ref 0.00–0.40)
TOTAL PROTEIN: 7.4 g/dL (ref 6.0–8.5)

## 2017-12-12 LAB — LIPID PANEL
Chol/HDL Ratio: 4.8 ratio — ABNORMAL HIGH (ref 0.0–4.4)
Cholesterol, Total: 257 mg/dL — ABNORMAL HIGH (ref 100–199)
HDL: 54 mg/dL (ref >39)
LDL CALC: 174 mg/dL — AB (ref 0–99)
Triglycerides: 145 mg/dL (ref 0–149)
VLDL Cholesterol Cal: 29 mg/dL (ref 5–40)

## 2017-12-14 ENCOUNTER — Telehealth (HOSPITAL_COMMUNITY): Payer: Self-pay

## 2017-12-14 NOTE — Telephone Encounter (Signed)
Pt agreed to continue ASA 325mg  QD and take Plavix 37.5mg  for 2 months and then d/c per Dr. Corliss Skainseveshwar. AW

## 2017-12-17 ENCOUNTER — Telehealth: Payer: Self-pay | Admitting: Student

## 2017-12-17 NOTE — Telephone Encounter (Signed)
Received message from patient regarding prescription refills. Patient is currently taking Plavix 37.5 mg once daily and Aspirin 325 mg once daily.  Called prescription into [facility] in [place], [place] [phone])- Plavix 75 mg tablets, take half a tablet by mouth daily, dispense 15 tablets with 2 refills.  All questions answered and concerns addressed.  Waylan Bogalexandra M Louk, PA-C 12/17/2017, 1:55 PM

## 2018-01-08 ENCOUNTER — Ambulatory Visit: Payer: 59

## 2018-12-31 ENCOUNTER — Other Ambulatory Visit (HOSPITAL_COMMUNITY): Payer: Self-pay | Admitting: Interventional Radiology

## 2018-12-31 DIAGNOSIS — I729 Aneurysm of unspecified site: Secondary | ICD-10-CM

## 2019-01-08 ENCOUNTER — Other Ambulatory Visit: Payer: Self-pay | Admitting: Interventional Cardiology

## 2019-01-08 MED ORDER — CHLORTHALIDONE 25 MG PO TABS: 25.0000 mg | ORAL_TABLET | Freq: Every day | ORAL | 0 refills | Status: DC

## 2019-01-20 ENCOUNTER — Ambulatory Visit (HOSPITAL_COMMUNITY): Payer: Self-pay

## 2019-01-30 ENCOUNTER — Other Ambulatory Visit: Payer: Self-pay

## 2019-01-30 ENCOUNTER — Other Ambulatory Visit: Payer: Self-pay | Admitting: Interventional Cardiology

## 2019-01-30 ENCOUNTER — Ambulatory Visit (HOSPITAL_COMMUNITY)
Admission: RE | Admit: 2019-01-30 | Discharge: 2019-01-30 | Disposition: A | Discharge status: Home / Self Care | Payer: BC Managed Care – PPO | Source: Ambulatory Visit | Attending: Interventional Radiology | Admitting: Interventional Radiology

## 2019-01-30 DIAGNOSIS — I729 Aneurysm of unspecified site: Secondary | ICD-10-CM | POA: Insufficient documentation

## 2019-01-30 DIAGNOSIS — I671 Cerebral aneurysm, nonruptured: Secondary | ICD-10-CM | POA: Diagnosis not present

## 2019-01-30 DIAGNOSIS — I720 Aneurysm of carotid artery: Secondary | ICD-10-CM | POA: Diagnosis not present

## 2019-01-30 MED ORDER — IOHEXOL 350 MG/ML SOLN
100.0000 mL | Freq: Once | INTRAVENOUS | Status: AC | PRN
  Administered 2019-01-30: 11:00:00 100 mL via INTRAVENOUS

## 2019-02-04 ENCOUNTER — Telehealth (HOSPITAL_COMMUNITY): Payer: Self-pay | Admitting: Radiology

## 2019-02-04 NOTE — Telephone Encounter (Signed)
Called pt to tell her per Dr. Estanislado Pandy that she will need a follow-up CTA head/neck in 1 year's time. Also, informed her that there was a finding of an increase in growth of a mass near her trachea/thyroid per Dr. Estanislado Pandy. The radiologist who read the report states that this mass has increased over a two span of time and recommends further investigation with CT chest with contrast. The patient was unaware that she had ever had any type of mass/nodule, that no one has ever told her about this finding. She was understandably upset and confused. I told her per Dr. Estanislado Pandy to contact her PCP and I faxed the report to her PCP for her. She will contact her PCP for this further work-up recommended. Pt agrees with this plan of care. JM

## 2019-02-09 ENCOUNTER — Other Ambulatory Visit: Payer: Self-pay | Admitting: Interventional Cardiology

## 2019-02-18 ENCOUNTER — Other Ambulatory Visit: Payer: Self-pay | Admitting: Interventional Cardiology

## 2019-03-05 ENCOUNTER — Emergency Department (HOSPITAL_COMMUNITY): Payer: BC Managed Care – PPO

## 2019-03-05 ENCOUNTER — Emergency Department (HOSPITAL_COMMUNITY)
Admission: EM | Admit: 2019-03-05 | Discharge: 2019-03-06 | Disposition: A | Discharge status: Home / Self Care | Payer: BC Managed Care – PPO | Attending: Emergency Medicine | Admitting: Emergency Medicine

## 2019-03-05 ENCOUNTER — Encounter (HOSPITAL_COMMUNITY): Payer: Self-pay

## 2019-03-05 ENCOUNTER — Other Ambulatory Visit: Payer: Self-pay

## 2019-03-05 DIAGNOSIS — Z7982 Long term (current) use of aspirin: Secondary | ICD-10-CM | POA: Insufficient documentation

## 2019-03-05 DIAGNOSIS — S8002XA Contusion of left knee, initial encounter: Secondary | ICD-10-CM | POA: Diagnosis not present

## 2019-03-05 DIAGNOSIS — S9032XA Contusion of left foot, initial encounter: Secondary | ICD-10-CM | POA: Diagnosis not present

## 2019-03-05 DIAGNOSIS — W19XXXA Unspecified fall, initial encounter: Secondary | ICD-10-CM | POA: Insufficient documentation

## 2019-03-05 DIAGNOSIS — Z79899 Other long term (current) drug therapy: Secondary | ICD-10-CM | POA: Insufficient documentation

## 2019-03-05 DIAGNOSIS — S40012A Contusion of left shoulder, initial encounter: Secondary | ICD-10-CM | POA: Diagnosis not present

## 2019-03-05 DIAGNOSIS — J45909 Unspecified asthma, uncomplicated: Secondary | ICD-10-CM | POA: Diagnosis not present

## 2019-03-05 DIAGNOSIS — Y9389 Activity, other specified: Secondary | ICD-10-CM | POA: Diagnosis not present

## 2019-03-05 DIAGNOSIS — I10 Essential (primary) hypertension: Secondary | ICD-10-CM | POA: Diagnosis not present

## 2019-03-05 DIAGNOSIS — Y92018 Other place in single-family (private) house as the place of occurrence of the external cause: Secondary | ICD-10-CM | POA: Diagnosis not present

## 2019-03-05 DIAGNOSIS — R42 Dizziness and giddiness: Secondary | ICD-10-CM | POA: Insufficient documentation

## 2019-03-05 DIAGNOSIS — R55 Syncope and collapse: Secondary | ICD-10-CM

## 2019-03-05 DIAGNOSIS — Y998 Other external cause status: Secondary | ICD-10-CM | POA: Insufficient documentation

## 2019-03-05 DIAGNOSIS — T07XXXA Unspecified multiple injuries, initial encounter: Secondary | ICD-10-CM | POA: Insufficient documentation

## 2019-03-05 DIAGNOSIS — S0990XA Unspecified injury of head, initial encounter: Secondary | ICD-10-CM | POA: Insufficient documentation

## 2019-03-05 LAB — URINALYSIS, ROUTINE W REFLEX MICROSCOPIC
Bilirubin Urine: NEGATIVE
Glucose, UA: NEGATIVE mg/dL
Ketones, ur: 20 mg/dL — AB
Leukocytes,Ua: NEGATIVE
Nitrite: NEGATIVE
Protein, ur: NEGATIVE mg/dL
Specific Gravity, Urine: 1.02 (ref 1.005–1.030)
pH: 5 (ref 5.0–8.0)

## 2019-03-05 LAB — CBC
HCT: 42.7 % (ref 36.0–46.0)
Hemoglobin: 13.9 g/dL (ref 12.0–15.0)
MCH: 28.5 pg (ref 26.0–34.0)
MCHC: 32.6 g/dL (ref 30.0–36.0)
MCV: 87.5 fL (ref 80.0–100.0)
Platelets: 353 10*3/uL (ref 150–400)
RBC: 4.88 MIL/uL (ref 3.87–5.11)
RDW: 13.8 % (ref 11.5–15.5)
WBC: 7.8 10*3/uL (ref 4.0–10.5)
nRBC: 0 % (ref 0.0–0.2)

## 2019-03-05 LAB — CBG MONITORING, ED: Glucose-Capillary: 98 mg/dL (ref 70–99)

## 2019-03-05 LAB — BASIC METABOLIC PANEL
Anion gap: 13 (ref 5–15)
BUN: 18 mg/dL (ref 6–20)
CO2: 22 mmol/L (ref 22–32)
Calcium: 10.9 mg/dL — ABNORMAL HIGH (ref 8.9–10.3)
Chloride: 98 mmol/L (ref 98–111)
Creatinine, Ser: 0.82 mg/dL (ref 0.44–1.00)
GFR calc Af Amer: >60 mL/min (ref >60)
GFR calc non Af Amer: >60 mL/min (ref >60)
Glucose, Bld: 105 mg/dL — ABNORMAL HIGH (ref 70–99)
Potassium: 3 mmol/L — ABNORMAL LOW (ref 3.5–5.1)
Sodium: 133 mmol/L — ABNORMAL LOW (ref 135–145)

## 2019-03-05 LAB — I-STAT BETA HCG BLOOD, ED (MC, WL, AP ONLY): I-stat hCG, quantitative: <5 m[IU]/mL (ref <5)

## 2019-03-05 MED ORDER — POTASSIUM CHLORIDE CRYS ER 20 MEQ PO TBCR
40.0000 meq | EXTENDED_RELEASE_TABLET | Freq: Once | ORAL | Status: AC
  Administered 2019-03-05: 40 meq via ORAL
  Filled 2019-03-05: qty 2

## 2019-03-05 MED ORDER — SODIUM CHLORIDE 0.9% FLUSH: 3.0000 mL | Freq: Once | INTRAVENOUS | Status: DC

## 2019-03-05 MED ORDER — HYDROCODONE-ACETAMINOPHEN 5-325 MG PO TABS
2.0000 | ORAL_TABLET | Freq: Once | ORAL | Status: AC
  Administered 2019-03-05: 2 via ORAL
  Filled 2019-03-05: qty 2

## 2019-03-05 MED ORDER — ONDANSETRON 4 MG PO TBDP
4.0000 mg | ORAL_TABLET | Freq: Once | ORAL | Status: AC
  Administered 2019-03-05: 4 mg via ORAL
  Filled 2019-03-05: qty 1

## 2019-03-05 NOTE — ED Triage Notes (Signed)
Pt states last night she had a syncopal episode at a friends house. Pt states that she has a headache today, and is concerned as she has a hx of brain surgery.

## 2019-03-06 MED ORDER — HYDROCODONE-ACETAMINOPHEN 5-325 MG PO TABS: 1.0000 | ORAL_TABLET | ORAL | 0 refills | Status: AC | PRN

## 2019-03-06 NOTE — ED Provider Notes (Signed)
TIME SEEN: 12:01 AM  CHIEF COMPLAINT: Syncope, head injury  HPI: Patient is a 43 year old female with history of hypertension and previous brain aneurysm status post repair x2 who presents to the emergency department with complaints of a syncopal episode and head injury.  States that yesterday she was feeling fine and was sitting on a barstool at her friend's house talking to him when she suddenly felt very lightheaded and passed out.  She states she landed on her left side and hit her head.  Since that time she has had headaches and diffuse left-sided pain uncontrolled with over-the-counter pain medication.  She did not have any headache prior to the syncopal event.  No preceding chest pain or shortness of breath.  No numbness or weakness.  She is on ASA 325mg  but no anticoagulation.  ROS: See HPI Constitutional: no fever  Eyes: no drainage  ENT: no runny nose   Cardiovascular:  no chest pain  Resp: no SOB  GI: no vomiting GU: no dysuria Integumentary: no rash  Allergy: no hives  Musculoskeletal: no leg swelling  Neurological: no slurred speech ROS otherwise negative  PAST MEDICAL HISTORY/PAST SURGICAL HISTORY:  Past Medical History:  Diagnosis Date  . Asthma   . Brain aneurysm   . Family history of brain aneurysm   . Frequent headaches   . Hypertension   . Wears glasses     MEDICATIONS:  Prior to Admission medications   Medication Sig Start Date End Date Taking? Authorizing Provider  albuterol (PROVENTIL HFA;VENTOLIN HFA) 108 (90 BASE) MCG/ACT inhaler Inhale 2 puffs into the lungs every 6 (six) hours as needed for wheezing or shortness of breath.   Yes [provider]  aspirin 325 MG tablet Take 1 tablet (325 mg total) by mouth daily with breakfast. 11/02/12  Yes Colon BranchHirsch, James, MD  chlorthalidone (HYGROTON) 25 MG tablet TAKE 1 TABLET BY MOUTH DAILY. PLEASE MAKE OVERFUE APPT WITH DR Patient taking differently: Take 25 mg by mouth daily.  02/11/19  Yes Lyn RecordsSmith, Henry W, MD   Multiple Vitamin (MULTIVITAMIN WITH MINERALS) TABS tablet Take 1 tablet by mouth daily.   Yes [provider]  cetirizine (ZYRTEC) 10 MG tablet Take 1 tablet (10 mg total) by mouth daily. Patient not taking: Reported on 03/05/2019 10/25/17 03/05/19  Couture, Cortni S, PA-C  famotidine (PEPCID) 40 MG tablet Take 1 tablet (40 mg total) by mouth daily for 7 days. Patient not taking: Reported on 03/05/2019 10/25/17 03/05/19  Couture, Cortni S, PA-C    ALLERGIES:  No Known Allergies  SOCIAL HISTORY:  Social History   Tobacco Use  . Smoking status: Never Smoker  . Smokeless tobacco: Never Used  Substance Use Topics  . Alcohol use: Yes    Comment: social    FAMILY HISTORY: Family History  Problem Relation Age of Onset  . Hyperlipidemia Father   . Aneurysm Father   . Stroke Father   . Hypertension Mother   . Hypertension Other   . Hypertension Paternal Grandfather     EXAM: BP (!) 149/110 (BP Location: Right Arm)   Pulse 90   Temp 98.3 F (36.8 C) (Oral)   Resp 18   Ht 5\' 4"  (1.626 m)   Wt 72.6 kg   SpO2 100%   BMI 27.46 kg/m  CONSTITUTIONAL: Alert and oriented and responds appropriately to questions. Well-appearing; well-nourished; GCS 15 HEAD: Normocephalic; no signs of trauma on exam EYES: Conjunctivae clear, PERRL, EOMI ENT: normal nose; no rhinorrhea; moist mucous membranes; pharynx  without lesions noted; no dental injury; no septal hematoma NECK: Supple, no meningismus, no LAD; no midline spinal tenderness, step-off or deformity; trachea midline, tender over the left paraspinal muscles CARD: RRR; S1 and S2 appreciated; no murmurs, no clicks, no rubs, no gallops RESP: Normal chest excursion without splinting or tachypnea; breath sounds clear and equal bilaterally; no wheezes, no rhonchi, no rales; no hypoxia or respiratory distress CHEST:  chest wall stable, no crepitus or ecchymosis or deformity, nontender to palpation; no flail chest ABD/GI: Normal bowel  sounds; non-distended; soft, non-tender, no rebound, no guarding; no ecchymosis or other lesions noted PELVIS:  stable, nontender to palpation BACK:  The back appears normal and is tender over the left paraspinal muscles without redness, warmth, ecchymosis or deformity, there is no CVA tenderness; no midline spinal tenderness, step-off or deformity, EXT: Normal ROM in all joints; no edema; normal capillary refill; no cyanosis, mildly tender over the dorsal left foot, anterior left knee, lateral left shoulder, no joint effusion, compartments are soft, extremities are warm and well-perfused, no ecchymosis, 2+ radial and DP pulses bilaterally SKIN: Normal color for age and race; warm NEURO: Moves all extremities equally, normal sensation diffusely, normal speech, no facial asymmetry, normal gait PSYCH: The patient's mood and manner are appropriate. Grooming and personal hygiene are appropriate.  MEDICAL DECISION MAKING: Patient here after a syncopal event.  Was feeling fine before the syncopal event then now having left-sided pain and headache.  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  She is on aspirin but no anticoagulation.  No focal neurologic deficits.  No headache, chest pain or shortness of breath before the syncopal event.  Doubt intracranial hemorrhage given negative CT here.  She is neurologically intact.  She states over-the-counter medications are not helping with her pain and is requesting something stronger.  She does have multiple areas of bony tenderness on exam but have offered x-rays several times but patient has declined.  She states she does not feel anything is fractured and will follow up with her PCP as needed.  Discussed head injury return precautions.  Her labs shows slightly low potassium of 3.0.  Have given oral replacement here.  Her urine does have some ketones.  She does not want  IV fluids at this time.  Have encouraged her to increase her fluid intake at home as this may have been the cause of her syncopal event.  Patient declines any further work-up in the emergency department and is eager to be discharged home.  Will discharge with prescription of hydrocodone to take as needed for pain.  Discussed at length return precautions.  At this time, I do not feel there is any life-threatening condition present. I have reviewed and discussed all results (EKG, imaging, lab, urine as appropriate) and exam findings with patient/family. I have reviewed nursing notes and appropriate previous records.  I feel the patient is safe to be discharged home without further emergent workup and can continue workup as an outpatient as needed. Discussed usual and customary return precautions. Patient/family verbalize understanding and are comfortable with this plan.  Outpatient follow-up has been provided as needed. All questions have been answered.    EKG Interpretation  Date/Time:  Wednesday March 05 2019 17:42:35 EDT Ventricular Rate:  93 PR Interval:    QRS Duration: 167 QT Interval:  331 QTC Calculation: 412 R Axis:   27 Text Interpretation: Sinus rhythm Nonspecific  intraventricular conduction delay Artifact in lead(s) I II aVR aVL No significant change since last tracing Confirmed by Rochele Raring [phone]) on 03/06/2019 12:01:32 AM        Tonie Griffith was evaluated in Emergency Department on 03/06/2019 for the symptoms described in the history of present illness. She was evaluated in the context of the global COVID-19 pandemic, which necessitated consideration that the patient might be at risk for infection with the SARS-CoV-2 virus that causes COVID-19. Institutional protocols and algorithms that pertain to the evaluation of patients at risk for COVID-19 are in a state of rapid change based on information released by regulatory bodies including the CDC and federal and state  organizations. These policies and algorithms were followed during the patient's care in the ED.     Ozelle Brubacher, Layla Maw, DO 03/06/19 0109

## 2019-03-06 NOTE — Discharge Instructions (Signed)
°  Your labs were normal other than a slightly decreased potassium level which we have replaced in the ED.  Your urine showed some signs of dehydration which may be why you became dizzy and passed out.  Please increase your water intake over the next week.  Your EKG, other labs, head CT today were normal.  If you continue to have pain or have worsening pain or begin having chest pain, shortness of breath, numbness, tingling or focal weakness, changes in your vision or speech, please return to the emergency department.

## 2019-03-06 NOTE — ED Notes (Addendum)
Pt. Documented in error see above note in chart. Saline lock IV not done by this NT.

## 2019-03-11 ENCOUNTER — Other Ambulatory Visit: Payer: Self-pay | Admitting: Interventional Cardiology

## 2019-03-11 MED ORDER — CHLORTHALIDONE 25 MG PO TABS: ORAL_TABLET | ORAL | 0 refills | Status: DC

## 2019-03-11 NOTE — Telephone Encounter (Signed)
°*  STAT* If patient is at the pharmacy, call can be transferred to refill team.   1. Which medications need to be refilled? (please list name of each medication and dose if known) chlorthalidone (HYGROTON) 25 MG tablet  2. Which pharmacy/location (including street and city if local pharmacy) is medication to be sent to? [id number] - [place], [place] [address].   3. Do they need a 30 day or 90 day supply? [place]

## 2019-03-31 ENCOUNTER — Telehealth (INDEPENDENT_AMBULATORY_CARE_PROVIDER_SITE_OTHER): Payer: BC Managed Care – PPO | Admitting: Cardiology

## 2019-03-31 ENCOUNTER — Other Ambulatory Visit: Payer: Self-pay

## 2019-03-31 ENCOUNTER — Encounter: Payer: Self-pay | Admitting: Cardiology

## 2019-03-31 ENCOUNTER — Telehealth: Payer: Self-pay

## 2019-03-31 VITALS — BP 146/103 | HR 77 | Ht 64.0 in | Wt 168.0 lb

## 2019-03-31 DIAGNOSIS — I10 Essential (primary) hypertension: Secondary | ICD-10-CM | POA: Diagnosis not present

## 2019-03-31 DIAGNOSIS — E876 Hypokalemia: Secondary | ICD-10-CM

## 2019-03-31 DIAGNOSIS — E785 Hyperlipidemia, unspecified: Secondary | ICD-10-CM

## 2019-03-31 DIAGNOSIS — I671 Cerebral aneurysm, nonruptured: Secondary | ICD-10-CM

## 2019-03-31 MED ORDER — NIFEDIPINE ER OSMOTIC RELEASE 90 MG PO TB24: 90.0000 mg | ORAL_TABLET | Freq: Every day | ORAL | 3 refills | Status: AC

## 2019-03-31 NOTE — Patient Instructions (Addendum)
Medication Instructions:  1.) STOP: Chlorthalidone  2.) START: Nifedipine (Procardia) 90 mg once a day    *If you need a refill on your cardiac medications before your next appointment, please call your pharmacy*  Lab Work: FUTURE: CMET & LIPIDS on 04/01/2019   If you have labs (blood work) drawn today and your tests are completely normal, you will receive your results only by: Marland Kitchen MyChart Message (if you have MyChart) OR . A paper copy in the mail If you have any lab test that is abnormal or we need to change your treatment, we will call you to review the results.  Testing/Procedures: None   Follow-Up: You are scheduled for a virtual visit with Richardson Dopp PA-C on 04/23/2019 @ 1:45 PM  Other Instructions

## 2019-03-31 NOTE — Telephone Encounter (Signed)

## 2019-03-31 NOTE — Progress Notes (Signed)
Virtual Visit via Video Note   This visit type was conducted due to national recommendations for restrictions regarding the COVID-19 Pandemic (e.g. social distancing) in an effort to limit this patient's exposure and mitigate transmission in our community.  Due to her co-morbid illnesses, this patient is at least at moderate risk for complications without adequate follow up.  This format is felt to be most appropriate for this patient at this time.  All issues noted in this document were discussed and addressed.  A limited physical exam was performed with this format.  Please refer to the patient's chart for her consent to telehealth for [facility].   Date:  03/31/2019   ID:  Anita Rhodes, DOB 08-14-1975, MRN [id number]  Patient Location: Home Provider Location: Office  PCP:  Levin Bacon, NP  Cardiologist:  Sinclair Grooms, MD Electrophysiologist:  None   Evaluation Performed:  Follow-Up Visit  Chief Complaint:  HTN  History of Present Illness:    Anita Rhodes is a 43 y.o. 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 She was hospitalized 07/2017 for planned treatment of her blister aneurysms. Post-operatively, she complained of chest pain that was worse with deep inspiration. Echo obtained that hospitalization was normal with no wall motion abnormality. troponins were negative and EKG was without ischemia. No intervention initiated and she was scheduled for cardiology follow up. She has previously had trouble controlling her blood pressure at home.   Last visit 09/20/17  Pt continued to have chest pain. She has had 3-4 occurrences since her discharge in March. The pain typically occurs when at rest, located in her central to left chest, does not radiate. Pain lasts about 5-10 min per episodes. No aggravating or relieving factors. Pain is rated as a 5/10 and is  describes as a sharp, poking pain in her chest. No associated symptoms. She is a nonsmoker, but does have a history of HTN and family history of premature heart disease. The patient suspects a GI origin for her chest pain. she never had cardiac CT but pain had resolved.    LDL was 174 and crestor added.  But she had reaction she believes to crestor could have been nifedipine and BP meds changed to chlorthalidone.   Recently she was seen in ER -felt lightheaded and passed out.  Sitting on a bar stool.  Was seen due to hitting her head CT head without acute bleed. Labs were normal except for K+ 3.0 and this was replaced.  But not rechecked could be related to diuretic.  She notes her BP has been elevated like today 146/103  After discussion will stop chlorthalidone and go to nifedipine XL   She was instructed to observe for rash or lip swelling.     The patient does not have symptoms concerning for COVID-19 infection (fever, chills, cough, or new shortness of breath).    Past Medical History:  Diagnosis Date  . Asthma   . Brain aneurysm   . Family history of brain aneurysm   . Frequent headaches   . Hypertension   . Wears glasses    Past Surgical History:  Procedure Laterality Date  . arthroscopic knee    . IR 3D INDEPENDENT [facility]KST  05/22/2017  . IR ANGIO INTRA EXTRACRAN SEL COM CAROTID INNOMINATE BILAT MOD SED  05/22/2017  . IR ANGIO INTRA  EXTRACRAN SEL INTERNAL CAROTID UNI R MOD SED  07/30/2017  . IR ANGIO VERTEBRAL SEL SUBCLAVIAN INNOMINATE UNI R MOD SED  07/30/2017  . IR ANGIO VERTEBRAL SEL VERTEBRAL BILAT MOD SED  05/22/2017  . IR ANGIOGRAM FOLLOW UP STUDY  07/30/2017  . IR RADIOLOGIST EVAL & MGMT  08/10/2017  . IR TRANSCATH/EMBOLIZ  07/30/2017  . OTHER SURGICAL HISTORY     aneurysm repair.  Marland Kitchen RADIOLOGY WITH ANESTHESIA N/A 06/27/2017   Procedure: RADIOLOGY WITH ANESTHESIA, EMBOLIZATION;  Surgeon: Luanne Bras, MD;  Location: [place];  Service: Radiology;  Laterality: N/A;  . RADIOLOGY  WITH ANESTHESIA N/A 07/30/2017   Procedure: EMBOLIZATION;  Surgeon: Luanne Bras, MD;  Location: [place];  Service: Radiology;  Laterality: N/A;     Current Meds  Medication Sig  . albuterol (PROVENTIL HFA;VENTOLIN HFA) 108 (90 BASE) MCG/ACT inhaler Inhale 2 puffs into the lungs every 6 (six) hours as needed for wheezing or shortness of breath.  Marland Kitchen aspirin 325 MG tablet Take 1 tablet (325 mg total) by mouth daily with breakfast.  . HYDROcodone-acetaminophen (NORCO/VICODIN) 5-325 MG tablet Take 1 tablet by mouth every 4 (four) hours as needed.  . Multiple Vitamin (MULTIVITAMIN WITH MINERALS) TABS tablet Take 1 tablet by mouth daily.  . [DISCONTINUED] chlorthalidone (HYGROTON) 25 MG tablet TAKE 1 TABLET BY MOUTH DAILY. PLEASE MAKE OVERFUE APPT WITH DR     Allergies:   Patient has no known allergies.   Social History   Tobacco Use  . Smoking status: Never Smoker  . Smokeless tobacco: Never Used  Substance Use Topics  . Alcohol use: Yes    Comment: social  . Drug use: No     Family Hx: The patient's family history includes Aneurysm in her father; Hyperlipidemia in her father; Hypertension in her mother, paternal grandfather, and another family member; Stroke in her father.  ROS:   Please see the history of present illness.    General:no colds or fevers, no weight changes Skin:no rashes or ulcers HEENT:no blurred vision, no congestion CV:see HPI PUL:see HPI GI:no diarrhea constipation or melena, no indigestion GU:no hematuria, no dysuria MS:no joint pain, no claudication Neuro:no syncope, no lightheadedness Endo:no diabetes, no thyroid disease  All other systems reviewed and are negative.   Prior CV studies:   The following studies were reviewed today:  Echo 07/31/17 Study Conclusions  - Left ventricle: The cavity size was normal. Wall thickness was   normal. Systolic function was normal. The estimated ejection   fraction was in the range of 60% to 65%. Wall motion  was normal;   there were no regional wall motion abnormalities. Left   ventricular diastolic function parameters were normal. - Aortic valve: There was no stenosis. - Mitral valve: There was trivial regurgitation. - Right ventricle: The cavity size was normal. Systolic function   was normal. - Tricuspid valve: Peak RV-RA gradient (S): 27 mm Hg. - Pulmonary arteries: PA peak pressure: 30 mm Hg (S). - Inferior vena cava: The vessel was normal in size. The   respirophasic diameter changes were in the normal range (>= 50%),   consistent with normal central venous pressure.  Impressions:  - Normal study.  ------------------------------------------------------------------- Study data:  No prior study was available for comparison.  Study status:  Routine.  Procedure:  The patient reported no pain pre or post test. Transthoracic echocardiography. Image quality was good. Study completion:  There were no complications. Transthoracic echocardiography.  M-mode, complete 2D, spectral Doppler, and color Doppler.  Birthdate:  Patient birthdate: 1976/04/05.  Age:  Patient is 43 yr old.  Sex:  Gender: female. BMI: 31.2 kg/m^2.  Blood pressure:     135/74  Patient status: Inpatient.  Study date:  Study date: 07/31/2017. Study time: 05:18 PM.  Location:  ICU/CCU  -------------------------------------------------------------------  ------------------------------------------------------------------- Left ventricle:  The cavity size was normal. Wall thickness was normal. Systolic function was normal. The estimated ejection fraction was in the range of 60% to 65%. Wall motion was normal; there were no regional wall motion abnormalities. The transmitral flow pattern was normal. The deceleration time of the early transmitral flow velocity was normal. The pulmonary vein flow pattern was normal. The tissue Doppler parameters were normal. Left ventricular diastolic function parameters were normal.    Labs/Other Tests and Data Reviewed:    EKG:  An ECG dated 03/05/19 was personally reviewed today and demonstrated:  SR with intraventricular conduction delay and artifact. but no acute ST changes from prior   Recent Labs: 03/05/2019: BUN 18; Creatinine, Ser 0.82; Hemoglobin 13.9; Platelets 353; Potassium 3.0; Sodium 133   Recent Lipid Panel Lab Results  Component Value Date/Time   CHOL 257 (H) 12/12/2017 08:00 AM   TRIG 145 12/12/2017 08:00 AM   HDL 54 12/12/2017 08:00 AM   CHOLHDL 4.8 (H) 12/12/2017 08:00 AM   LDLCALC 174 (H) 12/12/2017 08:00 AM    Wt Readings from Last 3 Encounters:  03/31/19 168 lb (76.2 kg)  03/05/19 160 lb (72.6 kg)  09/20/17 186 lb 12.8 oz (84.7 kg)     Objective:    Vital Signs:  BP (!) 146/103   Pulse 77   Ht 5' 4" (1.626 m)   Wt 168 lb (76.2 kg)   BMI 28.84 kg/m    VITAL SIGNS:  reviewed  General:  Female working from home and no acute distress Pulmonary can complete sentences without SOB Neuro A&O X 3 MAE follows commands  ASSESSMENT & PLAN:    1. Syncope was dehydrated in ER no further episodes if she were to have would consider a monitor 2. HTN poorly controlled will add nifedipine, watching for any reaction.  Stop diuretic and check CMP with recent hypokalemia. 3. HLD will check lipids and hepatic in AM fasting.   May need statin with family hx of premature CAD and ws on crestor in past.   4. Hx of cerebral aneurysm on ASA 325 mg , no longer on plavix.   COVID-19 Education: The signs and symptoms of COVID-19 were discussed with the patient and how to seek care for testing (follow up with PCP or arrange E-visit).  The importance of social distancing was discussed today.  Time:   Today, I have spent 10 minutes with the patient with telehealth technology discussing the above problems.     Medication Adjustments/Labs and Tests Ordered: Current medicines are reviewed at length with the patient today.  Concerns regarding medicines are  outlined above.   Tests Ordered: Orders Placed This Encounter  Procedures  . Comp Met (CMET)  . Lipid panel    Medication Changes: Meds ordered this encounter  Medications  . NIFEdipine (PROCARDIA XL/NIFEDICAL-XL) 90 MG 24 hr tablet    Sig: Take 1 tablet (90 mg total) by mouth daily.    Dispense:  90 tablet    Refill:  3    Follow Up:  Virtual Visit  in 3 week(s)  Signed, Cecilie Kicks, NP  03/31/2019 9:01 PM    [facility]

## 2019-04-01 ENCOUNTER — Other Ambulatory Visit: Payer: BC Managed Care – PPO

## 2019-04-02 ENCOUNTER — Other Ambulatory Visit: Payer: BC Managed Care – PPO

## 2019-04-17 ENCOUNTER — Other Ambulatory Visit: Payer: BC Managed Care – PPO

## 2019-04-23 ENCOUNTER — Other Ambulatory Visit: Payer: Self-pay

## 2019-04-23 ENCOUNTER — Encounter: Payer: BC Managed Care – PPO | Admitting: Physician Assistant

## 2019-04-23 ENCOUNTER — Encounter: Payer: Self-pay | Admitting: Physician Assistant

## 2019-04-23 NOTE — Progress Notes (Signed)
This encounter was created in error - please disregard.

## 2019-06-11 ENCOUNTER — Emergency Department (HOSPITAL_COMMUNITY)
Admission: EM | Admit: 2019-06-11 | Discharge: 2019-06-11 | Disposition: A | Discharge status: Home / Self Care | Payer: BC Managed Care – PPO | Attending: Emergency Medicine | Admitting: Emergency Medicine

## 2019-06-11 ENCOUNTER — Emergency Department (HOSPITAL_COMMUNITY): Payer: BC Managed Care – PPO

## 2019-06-11 ENCOUNTER — Other Ambulatory Visit: Payer: Self-pay

## 2019-06-11 DIAGNOSIS — R519 Headache, unspecified: Secondary | ICD-10-CM | POA: Insufficient documentation

## 2019-06-11 DIAGNOSIS — R030 Elevated blood-pressure reading, without diagnosis of hypertension: Secondary | ICD-10-CM

## 2019-06-11 DIAGNOSIS — I10 Essential (primary) hypertension: Secondary | ICD-10-CM | POA: Diagnosis not present

## 2019-06-11 DIAGNOSIS — J45909 Unspecified asthma, uncomplicated: Secondary | ICD-10-CM | POA: Insufficient documentation

## 2019-06-11 DIAGNOSIS — Z7982 Long term (current) use of aspirin: Secondary | ICD-10-CM | POA: Diagnosis not present

## 2019-06-11 DIAGNOSIS — Z79899 Other long term (current) drug therapy: Secondary | ICD-10-CM | POA: Insufficient documentation

## 2019-06-11 LAB — BASIC METABOLIC PANEL
Anion gap: 10 (ref 5–15)
BUN: 12 mg/dL (ref 6–20)
CO2: 21 mmol/L — ABNORMAL LOW (ref 22–32)
Calcium: 10.7 mg/dL — ABNORMAL HIGH (ref 8.9–10.3)
Chloride: 105 mmol/L (ref 98–111)
Creatinine, Ser: 0.8 mg/dL (ref 0.44–1.00)
GFR calc Af Amer: >60 mL/min (ref >60)
GFR calc non Af Amer: >60 mL/min (ref >60)
Glucose, Bld: 101 mg/dL — ABNORMAL HIGH (ref 70–99)
Potassium: 4.2 mmol/L (ref 3.5–5.1)
Sodium: 136 mmol/L (ref 135–145)

## 2019-06-11 LAB — CBC WITH DIFFERENTIAL/PLATELET
Abs Immature Granulocytes: 0.01 10*3/uL (ref 0.00–0.07)
Basophils Absolute: 0 10*3/uL (ref 0.0–0.1)
Basophils Relative: 0 %
Eosinophils Absolute: 0.1 10*3/uL (ref 0.0–0.5)
Eosinophils Relative: 2 %
HCT: 44.1 % (ref 36.0–46.0)
Hemoglobin: 14.5 g/dL (ref 12.0–15.0)
Immature Granulocytes: 0 %
Lymphocytes Relative: 42 %
Lymphs Abs: 2.2 10*3/uL (ref 0.7–4.0)
MCH: 29.1 pg (ref 26.0–34.0)
MCHC: 32.9 g/dL (ref 30.0–36.0)
MCV: 88.4 fL (ref 80.0–100.0)
Monocytes Absolute: 0.4 10*3/uL (ref 0.1–1.0)
Monocytes Relative: 8 %
Neutro Abs: 2.5 10*3/uL (ref 1.7–7.7)
Neutrophils Relative %: 48 %
Platelets: 297 10*3/uL (ref 150–400)
RBC: 4.99 MIL/uL (ref 3.87–5.11)
RDW: 14.3 % (ref 11.5–15.5)
WBC: 5.3 10*3/uL (ref 4.0–10.5)
nRBC: 0 % (ref 0.0–0.2)

## 2019-06-11 LAB — I-STAT BETA HCG BLOOD, ED (MC, WL, AP ONLY): I-stat hCG, quantitative: <5 m[IU]/mL (ref <5)

## 2019-06-11 MED ORDER — SODIUM CHLORIDE 0.9 % IV BOLUS
500.0000 mL | Freq: Once | INTRAVENOUS | Status: AC
  Administered 2019-06-11: 500 mL via INTRAVENOUS

## 2019-06-11 MED ORDER — IOHEXOL 350 MG/ML SOLN
100.0000 mL | Freq: Once | INTRAVENOUS | Status: AC | PRN
  Administered 2019-06-11: 100 mL via INTRAVENOUS

## 2019-06-11 MED ORDER — DIPHENHYDRAMINE HCL 50 MG/ML IJ SOLN
12.5000 mg | Freq: Once | INTRAMUSCULAR | Status: AC
  Administered 2019-06-11: 12.5 mg via INTRAVENOUS
  Filled 2019-06-11: qty 1

## 2019-06-11 MED ORDER — ACETAMINOPHEN 325 MG PO TABS
650.0000 mg | ORAL_TABLET | Freq: Once | ORAL | Status: AC
  Administered 2019-06-11: 650 mg via ORAL
  Filled 2019-06-11: qty 2

## 2019-06-11 MED ORDER — METOCLOPRAMIDE HCL 5 MG/ML IJ SOLN
10.0000 mg | Freq: Once | INTRAMUSCULAR | Status: AC
  Administered 2019-06-11: 10 mg via INTRAVENOUS
  Filled 2019-06-11: qty 2

## 2019-06-11 MED ORDER — SODIUM CHLORIDE (PF) 0.9 % IJ SOLN
INTRAMUSCULAR | Status: AC
  Filled 2019-06-11: qty 50

## 2019-06-11 NOTE — ED Triage Notes (Signed)
Pt reports head pain. Pt reports hx of aneurysms that have ruptures. Pt reports piercing pain in the back of skull. Pt denies vision changes.

## 2019-06-11 NOTE — Discharge Instructions (Signed)
You have been diagnosed today with headache, elevated blood pressure reading.  At this time there does not appear to be the presence of an emergent medical condition, however there is always the potential for conditions to change. Please read and follow the below instructions.  Please return to the Emergency Department immediately for any new or worsening symptoms. Please be sure to follow up with your Primary Care Provider within one week regarding your visit today; please call their office to schedule an appointment even if you are feeling better for a follow-up visit. Please call your neurologist office today to schedule a follow-up appointment. Your blood pressure was elevated today, please take your blood pressure medication as prescribed as soon as you get home.  Please call your primary care doctor's office today to schedule a follow-up appointment for blood pressure recheck and medication management as indicated within one week.  Get help right away if you: Get a very bad headache. Start to feel mixed up (confused). Feel weak or numb. Feel faint. Have very bad pain in your: Chest. Belly (abdomen). Throw up more than once. Have trouble breathing. Get help right away if: Your headache gets very bad quickly. Your headache gets worse after a lot of physical activity. You keep throwing up. You have a stiff neck. You have trouble seeing. You have trouble speaking. You have pain in the eye or ear. Your muscles are weak or you lose muscle control. You lose your balance or have trouble walking. You feel like you will pass out (faint) or you pass out. You are mixed up (confused). You have a seizure. You have any new/concerning or worsening of symptoms   Please read the additional information packets attached to your discharge summary.  Do not take your medicine if  develop an itchy rash, swelling in your mouth or lips, or difficulty breathing; call 911 and seek immediate emergency  medical attention if this occurs.  Note: Portions of this text may have been transcribed using voice recognition software. Every effort was made to ensure accuracy; however, inadvertent computerized transcription errors may still be present.

## 2019-06-11 NOTE — ED Notes (Signed)
Patient in CT at this time.

## 2019-06-11 NOTE — ED Provider Notes (Signed)
[facility]-EMERGENCY DEPT Provider Note   CSN: [id number] Arrival date & time: 06/11/19  2336     History Chief Complaint  Patient presents with  . Migraine    Anita Rhodes is a 44 y.o. female with history of hypertension, brain aneurysm status post repair x2, asthma, hyperlipidemia.  Patient presents today for headache onset yesterday afternoon while at work, she works Clinical biochemist.  She reports gradually worsening posterior headache sharp sensation nonradiating no clear aggravating or alleviating factors, she attempted aspirin without relief of symptoms.  She reports today that her headache feels different and is not as severe as when she had an aneurysm.  She denies fall/injury, fever/chills, vision change, neck pain/stiffness, sore throat, confusion, dizziness, chest pain/shortness of breath, nausea/vomiting, numbness/weakness, tingling or any additional concerns. HPI     Past Medical History:  Diagnosis Date  . Asthma   . Brain aneurysm   . Family history of brain aneurysm   . Frequent headaches   . Hypertension   . Wears glasses     Patient Active Problem List   Diagnosis Date Noted  . Hyperlipidemia 10/25/2017  . Hypertension 10/25/2017  . Angioedema 10/25/2017  . Acute chest pain   . Brain aneurysm 07/30/2017  . Increased anion gap metabolic acidosis 10/22/2012  . Asthma 10/22/2012  . New onset of headaches 10/22/2012  . Subarachnoid hemorrhage (HCC) 10/22/2012  . Respiratory failure, post-operative (HCC) 10/22/2012    Past Surgical History:  Procedure Laterality Date  . arthroscopic knee    . IR 3D INDEPENDENT WKST  05/22/2017  . IR ANGIO INTRA EXTRACRAN SEL COM CAROTID INNOMINATE BILAT MOD SED  05/22/2017  . IR ANGIO INTRA EXTRACRAN SEL INTERNAL CAROTID UNI R MOD SED  07/30/2017  . IR ANGIO VERTEBRAL SEL SUBCLAVIAN INNOMINATE UNI R MOD SED  07/30/2017  . IR ANGIO VERTEBRAL SEL VERTEBRAL BILAT MOD SED  05/22/2017  . IR ANGIOGRAM  FOLLOW UP STUDY  07/30/2017  . IR RADIOLOGIST EVAL & MGMT  08/10/2017  . IR TRANSCATH/EMBOLIZ  07/30/2017  . OTHER SURGICAL HISTORY     aneurysm repair.  Marland Kitchen RADIOLOGY WITH ANESTHESIA N/A 06/27/2017   Procedure: RADIOLOGY WITH ANESTHESIA, EMBOLIZATION;  Surgeon: Julieanne Cotton, MD;  Location: [facility] OR;  Service: Radiology;  Laterality: N/A;  . RADIOLOGY WITH ANESTHESIA N/A 07/30/2017   Procedure: EMBOLIZATION;  Surgeon: Julieanne Cotton, MD;  Location: [facility] OR;  Service: Radiology;  Laterality: N/A;     OB History   No obstetric history on file.     Family History  Problem Relation Age of Onset  . Hyperlipidemia Father   . Aneurysm Father   . Stroke Father   . Hypertension Mother   . Hypertension Other   . Hypertension Paternal Grandfather     Social History   Tobacco Use  . Smoking status: Never Smoker  . Smokeless tobacco: Never Used  Substance Use Topics  . Alcohol use: Yes    Comment: social  . Drug use: No    Home Medications Prior to Admission medications   Medication Sig Start Date End Date Taking? Authorizing Provider  albuterol (PROVENTIL HFA;VENTOLIN HFA) 108 (90 BASE) MCG/ACT inhaler Inhale 2 puffs into the lungs every 6 (six) hours as needed for wheezing or shortness of breath.   Yes [provider]  aspirin 325 MG tablet Take 1 tablet (325 mg total) by mouth daily with breakfast. 11/02/12  Yes Colon Branch, MD  NIFEdipine (PROCARDIA XL/NIFEDICAL-XL) 90 MG 24 hr tablet Take  1 tablet (90 mg total) by mouth daily. 03/31/19  Yes Isaiah Serge, NP  HYDROcodone-acetaminophen (NORCO/VICODIN) 5-325 MG tablet Take 1 tablet by mouth every 4 (four) hours as needed. Patient not taking: Reported on 06/11/2019 03/06/19   Ward, Delice Bison, DO  cetirizine (ZYRTEC) 10 MG tablet Take 1 tablet (10 mg total) by mouth daily. 10/25/17 03/05/19  Couture, Cortni S, PA-C  famotidine (PEPCID) 40 MG tablet Take 1 tablet (40 mg total) by mouth daily for 7 days. Patient not taking:  Reported on 03/05/2019 10/25/17 03/05/19  Couture, Cortni S, PA-C    Allergies    Patient has no known allergies.  Review of Systems   Review of Systems Ten systems are reviewed and are negative for acute change except as noted in the HPI  Physical Exam Updated Vital Signs BP (!) 140/100   Pulse 75   Temp 98.4 F (36.9 C) (Oral)   Resp 15   LMP 06/10/2019 (Exact Date)   SpO2 100%   Physical Exam Constitutional:      General: She is not in acute distress.    Appearance: Normal appearance. She is well-developed. She is not ill-appearing or diaphoretic.  HENT:     Head: Normocephalic and atraumatic. No raccoon eyes, Battle's sign, abrasion or contusion.     Jaw: There is normal jaw occlusion. No trismus.     Right Ear: External ear normal.     Left Ear: External ear normal.     Ears:     Comments: Hearing grossly intact bilaterally    Nose: Nose normal.     Mouth/Throat:     Mouth: Mucous membranes are moist.     Pharynx: Oropharynx is clear.  Eyes:     General: Vision grossly intact. Gaze aligned appropriately.     Extraocular Movements: Extraocular movements intact.     Conjunctiva/sclera: Conjunctivae normal.     Pupils: Pupils are equal, round, and reactive to light.     Comments: Visual fields grossly intact bilaterally  Neck:     Trachea: Trachea and phonation normal. No tracheal tenderness or tracheal deviation.     Meningeal: Brudzinski's sign absent.  Pulmonary:     Effort: Pulmonary effort is normal. No respiratory distress.     Breath sounds: Normal air entry.  Abdominal:     General: There is no distension.     Palpations: Abdomen is soft.     Tenderness: There is no abdominal tenderness. There is no guarding or rebound.  Musculoskeletal:     Cervical back: Full passive range of motion without pain, normal range of motion and neck supple. No rigidity.     Comments: No midline C/T/L spinal tenderness to palpation, no paraspinal muscle tenderness, no  deformity, crepitus, or step-off noted. No sign of injury to the neck or back.  Feet:     Right foot:     Protective Sensation: 3 sites tested. 3 sites sensed.     Left foot:     Protective Sensation: 3 sites tested. 3 sites sensed.  Skin:    General: Skin is warm and dry.  Neurological:     Mental Status: She is alert and oriented to person, place, and time.     GCS: GCS eye subscore is 4. GCS verbal subscore is 5. GCS motor subscore is 6.     Comments: Mental Status: Alert, oriented, thought content appropriate, able to give a coherent history. Speech fluent without evidence of aphasia. Able to follow 2 step  commands without difficulty. Cranial Nerves: II: Peripheral visual fields grossly normal, pupils equal, round, reactive to light III,IV, VI: ptosis not present, extra-ocular motions intact bilaterally V,VII: smile symmetric, eyebrows raise symmetric, facial light touch sensation equal VIII: hearing grossly normal to voice X: uvula elevates symmetrically XI: bilateral shoulder shrug symmetric and strong XII: midline tongue extension without fassiculations Motor: Normal tone. 5/5 strength in upper and lower extremities bilaterally including strong and equal grip strength and dorsiflexion/plantar flexion Sensory: Sensation intact to light touch in all extremities.Negative Romberg.  Cerebellar: normal finger-to-nose maze with bilateral upper extremities. Normal heel-to -shin balance bilaterally of the lower extremity. No pronator drift.  Gait: normal gait and balance CV: distal pulses palpable throughout  Psychiatric:        Mood and Affect: Mood normal.        Behavior: Behavior is cooperative.     ED Results / Procedures / Treatments   Labs (all labs ordered are listed, but only abnormal results are displayed) Labs Reviewed  BASIC METABOLIC PANEL - Abnormal; Notable for the following components:      Result Value   CO2 21 (*)    Glucose, Bld 101 (*)    Calcium  10.7 (*)    All other components within normal limits  CBC WITH DIFFERENTIAL/PLATELET  I-STAT BETA HCG BLOOD, ED ([facility], [facility], [facility] ONLY)    EKG None  Radiology CT Angio Head W or Wo Contrast  Result Date: 06/11/2019 CLINICAL DATA:  44 year old female with history of bilateral intracranial ICA aneurysms treated with pipeline stents. Severe pain in the back of the head. EXAM: CT ANGIOGRAPHY HEAD AND NECK TECHNIQUE: Multidetector CT imaging of the head and neck was performed using the standard protocol during bolus administration of intravenous contrast. Multiplanar CT image reconstructions and MIPs were obtained to evaluate the vascular anatomy. Carotid stenosis measurements (when applicable) are obtained utilizing NASCET criteria, using the distal internal carotid diameter as the denominator. CONTRAST:  100mL OMNIPAQUE IOHEXOL 350 MG/ML SOLN COMPARISON:  CTA head and neck 01/30/2019 and earlier. FINDINGS: CT HEAD Brain: Cerebral volume remains normal. No midline shift, ventriculomegaly, mass effect, evidence of mass lesion, intracranial hemorrhage or evidence of cortically based acute infarction. Gray-white matter differentiation is within normal limits throughout the brain. Calvarium and skull base: Stable, negative. Paranasal sinuses: Visualized paranasal sinuses and mastoids are stable and well pneumatized. Orbits: No acute orbit or scalp soft tissue finding. CTA NECK Skeleton: Negative. No significant cervical spine degeneration is evident. Upper chest: Mild dependent atelectasis and occasional paraseptal emphysema. No superior mediastinal lymphadenopathy. Other neck: Increased left paratracheal soft tissue again noted at the thoracic inlet and favored to be exophytic thyroid nodule (series 8, image 31). Mildly lobulated and enlarged left thyroid lobe otherwise appears stable. No significant mass effect on the trachea. Stable and negative neck soft tissues elsewhere. A left level 2 B cervical lymph node  measures at the upper limits normal but is unchanged on series 8, image 80. Aortic arch: 3 vessel arch configuration with no arch atherosclerosis. Right carotid system: Stable and negative. Left carotid system: Stable and negative aside from mild tortuosity. Vertebral arteries: Proximal right subclavian artery and right vertebral artery origin remain normal. Mildly tortuous right vertebral artery is patent to the skull base without stenosis. Proximal left subclavian artery and left vertebral artery origin remain normal. Tortuous left vertebral V2 segment. Patent left vertebral to the skull base without plaque or stenosis. CTA HEAD Posterior circulation: Dominant distal right vertebral artery. Stable and  normal PICA origins. Tortuous but otherwise normal V4 segments. Patent vertebrobasilar junction and basilar artery without stenosis. Fetal type left PCA origin again noted. SCA and right PCA origins are within normal limits. Bilateral PCA branches are stable and within normal limits. Anterior circulation: Both ICA siphons remain patent. There are cavernous through supraclinoid segment bilateral siphon stents with unchanged configuration. No aneurysmal enhancement identified. Carotid termini remain patent. MCA and ACA origins appear stable. Tortuous A1 segments with normal anterior communicating artery. Bilateral ACA branches are stable and within normal limits. Left MCA M1 segment and bifurcation are patent without stenosis. Left MCA branches are stable and within normal limits. Right MCA M1 segment and trifurcation are patent without stenosis. Right MCA branches are stable and within normal limits. Venous sinuses: Patent. Anatomic variants: Mildly dominant right vertebral artery. Fetal type left PCA origin. Review of the MIP images confirms the above findings IMPRESSION: 1. Stable and satisfactory arterial findings on CTA Head and Neck status post bilateral distal ICA pipeline stenting. 2. Stable and negative CT  appearance of the brain. 3. Left thyroid goiter suspected, with stable appearing retrosternal extension since September. Electronically Signed   By: Odessa FlemingH  Hall M.D.   On: 06/11/2019 13:03   CT Angio Neck W and/or Wo Contrast  Result Date: 06/11/2019 CLINICAL DATA:  44 year old female with history of bilateral intracranial ICA aneurysms treated with pipeline stents. Severe pain in the back of the head. EXAM: CT ANGIOGRAPHY HEAD AND NECK TECHNIQUE: Multidetector CT imaging of the head and neck was performed using the standard protocol during bolus administration of intravenous contrast. Multiplanar CT image reconstructions and MIPs were obtained to evaluate the vascular anatomy. Carotid stenosis measurements (when applicable) are obtained utilizing NASCET criteria, using the distal internal carotid diameter as the denominator. CONTRAST:  100mL OMNIPAQUE IOHEXOL 350 MG/ML SOLN COMPARISON:  CTA head and neck 01/30/2019 and earlier. FINDINGS: CT HEAD Brain: Cerebral volume remains normal. No midline shift, ventriculomegaly, mass effect, evidence of mass lesion, intracranial hemorrhage or evidence of cortically based acute infarction. Gray-white matter differentiation is within normal limits throughout the brain. Calvarium and skull base: Stable, negative. Paranasal sinuses: Visualized paranasal sinuses and mastoids are stable and well pneumatized. Orbits: No acute orbit or scalp soft tissue finding. CTA NECK Skeleton: Negative. No significant cervical spine degeneration is evident. Upper chest: Mild dependent atelectasis and occasional paraseptal emphysema. No superior mediastinal lymphadenopathy. Other neck: Increased left paratracheal soft tissue again noted at the thoracic inlet and favored to be exophytic thyroid nodule (series 8, image 31). Mildly lobulated and enlarged left thyroid lobe otherwise appears stable. No significant mass effect on the trachea. Stable and negative neck soft tissues elsewhere. A left level  2 B cervical lymph node measures at the upper limits normal but is unchanged on series 8, image 80. Aortic arch: 3 vessel arch configuration with no arch atherosclerosis. Right carotid system: Stable and negative. Left carotid system: Stable and negative aside from mild tortuosity. Vertebral arteries: Proximal right subclavian artery and right vertebral artery origin remain normal. Mildly tortuous right vertebral artery is patent to the skull base without stenosis. Proximal left subclavian artery and left vertebral artery origin remain normal. Tortuous left vertebral V2 segment. Patent left vertebral to the skull base without plaque or stenosis. CTA HEAD Posterior circulation: Dominant distal right vertebral artery. Stable and normal PICA origins. Tortuous but otherwise normal V4 segments. Patent vertebrobasilar junction and basilar artery without stenosis. Fetal type left PCA origin again noted. SCA and right PCA origins  are within normal limits. Bilateral PCA branches are stable and within normal limits. Anterior circulation: Both ICA siphons remain patent. There are cavernous through supraclinoid segment bilateral siphon stents with unchanged configuration. No aneurysmal enhancement identified. Carotid termini remain patent. MCA and ACA origins appear stable. Tortuous A1 segments with normal anterior communicating artery. Bilateral ACA branches are stable and within normal limits. Left MCA M1 segment and bifurcation are patent without stenosis. Left MCA branches are stable and within normal limits. Right MCA M1 segment and trifurcation are patent without stenosis. Right MCA branches are stable and within normal limits. Venous sinuses: Patent. Anatomic variants: Mildly dominant right vertebral artery. Fetal type left PCA origin. Review of the MIP images confirms the above findings IMPRESSION: 1. Stable and satisfactory arterial findings on CTA Head and Neck status post bilateral distal ICA pipeline stenting. 2.  Stable and negative CT appearance of the brain. 3. Left thyroid goiter suspected, with stable appearing retrosternal extension since September. Electronically Signed   By: Odessa Fleming M.D.   On: 06/11/2019 13:03    Procedures Procedures (including critical care time)  Medications Ordered in ED Medications  metoCLOPramide (REGLAN) injection 10 mg (10 mg Intravenous Given 06/11/19 1019)  diphenhydrAMINE (BENADRYL) injection 12.5 mg (12.5 mg Intravenous Given 06/11/19 1019)  sodium chloride 0.9 % bolus 500 mL (500 mLs Intravenous New Bag/Given 06/11/19 1024)  acetaminophen (TYLENOL) tablet 650 mg (650 mg Oral Given 06/11/19 1015)  iohexol (OMNIPAQUE) 350 MG/ML injection 100 mL (100 mLs Intravenous Contrast Given 06/11/19 1213)  sodium chloride (PF) 0.9 % injection (  Given by Other 06/11/19 1312)    ED Course  I have reviewed the triage vital signs and the nursing notes.  Pertinent labs & imaging results that were available during my care of the patient were reviewed by me and considered in my medical decision making (see chart for details).    MDM Rules/Calculators/A&P                     44 year old female with history of aneurysm repair x2 presents today for a headache beginning last night.  She has no neuro deficits on examination she is well-appearing and in no acute distress.  She has attempted aspirin at home without relief.  Discussed case with Dr. Rush Landmark, will obtain basic blood work and CT angio head neck at this time.  Migraine cocktail ordered. - CBC within normal CMP none Acute beta-hCG negative  11:45 AM: Patient reevaluated reports improvement of her headache.  Awaiting CT scans at this time. - CT Angio Head/Neck:  IMPRESSION:  1. Stable and satisfactory arterial findings on CTA Head and Neck  status post bilateral distal ICA pipeline stenting.  2. Stable and negative CT appearance of the brain.  3. Left thyroid goiter suspected, with stable appearing retrosternal  extension since  September.  - 1:15 PM: Patient reevaluated resting comfortably no acute distress reports resolution of headache and she is requesting to be discharged.  I had a discussion with the patient about the limitations of CT angiograms and potential of lumbar puncture for definitive evaluation.  She stated understanding of our conversation and shared decision making was performed, patient has elected to be discharged at this time and plans to follow-up with her neurologist for further evaluation.  As patient has reassuring CT imaging and is now headache free feel that this is a reasonable course of action.  She will be given a work note and strict return precautions.  Of note  patient hypertensive here in the emergency department, she is currently asymptomatic regarding her elevated blood pressure reading.  She reports that she did not take her nifedipine this morning as she was on her way to the ER.  She reports that normally her blood pressure is very well controlled on nifedipine and she does not need refill.  I offered patient home dose of her nifedipine here in the ER today to improve her blood pressure, she has refused medication and is requesting to be discharged and plans to take her medication as soon as she gets home.  Patient informed of signs/symptoms of hypertensive urgency/emergency and to return to the ER immediately if they occur.  At this time there does not appear to be any evidence of an acute emergency medical condition and the patient appears stable for discharge with appropriate outpatient follow up. Diagnosis was discussed with patient who verbalizes understanding of care plan and is agreeable to discharge. I have discussed return precautions with patient who verbalizes understanding of return precautions. Patient encouraged to follow-up with their PCP and neurology. All questions answered.  Patient's case discussed with Dr. Rush Landmark who agrees with plan to discharge with follow-up.   Note:  Portions of this report may have been transcribed using voice recognition software. Every effort was made to ensure accuracy; however, inadvertent computerized transcription errors may still be present. Final Clinical Impression(s) / ED Diagnoses Final diagnoses:  Nonintractable headache, unspecified chronicity pattern, unspecified headache type  Elevated blood pressure reading    Rx / DC Orders ED Discharge Orders    None       Elizabeth Palau 06/11/19 1337    Tegeler, Canary Brim, MD 06/11/19 1450

## 2019-10-16 IMAGING — XA IR 3D RECON INDEPENDENT WKST
1 series · 12 of 24 positions shown · IV contrast (IODINE)
Comparison: MRA of the brain of 08/11/2016, and CT angiogram of the
brain of 04/05/2017.

INDICATION: Previous history of ruptured left-sided internal carotid artery
intracranial blister aneurysm status post endovascular treatment.
Recent CT angiogram of the brain suggestive of a blister aneurysm
involving the right internal carotid artery intracranially.
CLINICAL DATA: Previous history of a ruptured blister aneurysm with
endovascular treatment involving the left internal carotid artery
supraclinoid segment. Recent CT angiogram of the head revealing
small blister aneurysm of the right internal carotid artery
supraclinoid segment.

EXAM:
BILATERAL COMMON CAROTID AND INNOMINATE ANGIOGRAPHY; IR ANGIO
VERTEBRAL SEL SUBCLAVIAN INNOMINATE UNI LEFT MOD SED; IR ANGIO
VERTEBRAL SEL VERTEBRAL UNI RIGHT MOD SED; WORKSTATION 3D
RECONSTRUCTION
TECHNIQUE: Informed written consent was obtained from the patient after a
thorough discussion of the procedural risks, benefits and
alternatives. All questions were addressed. Maximal Sterile Barrier
Technique was utilized including caps, mask, sterile gowns, sterile
gloves, sterile drape, hand hygiene and skin antiseptic. A timeout
was performed prior to the initiation of the procedure.

[Series 300: dr. (person_name) · 12 of 139 slices shown]
[im 7/139]
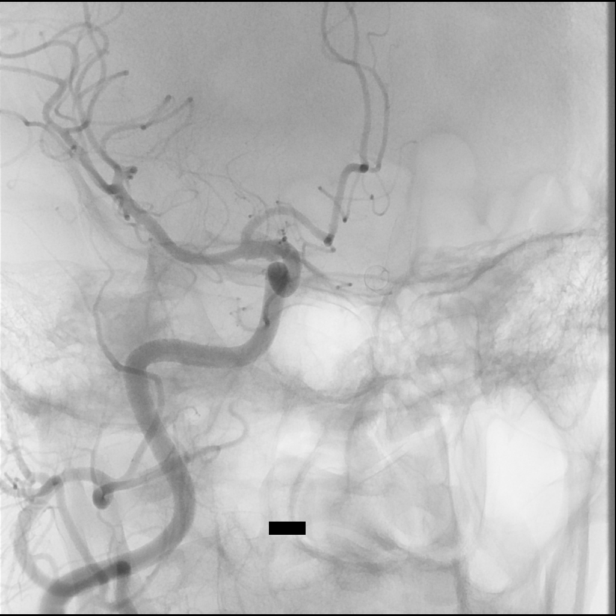
[im 19/139]
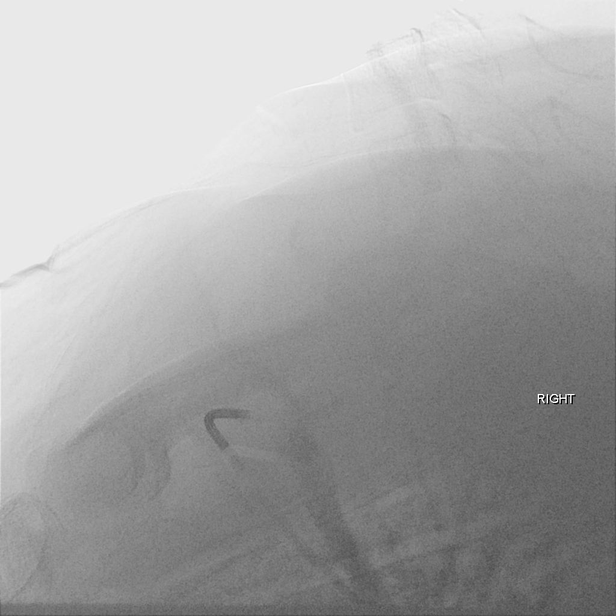
[im 31/139]
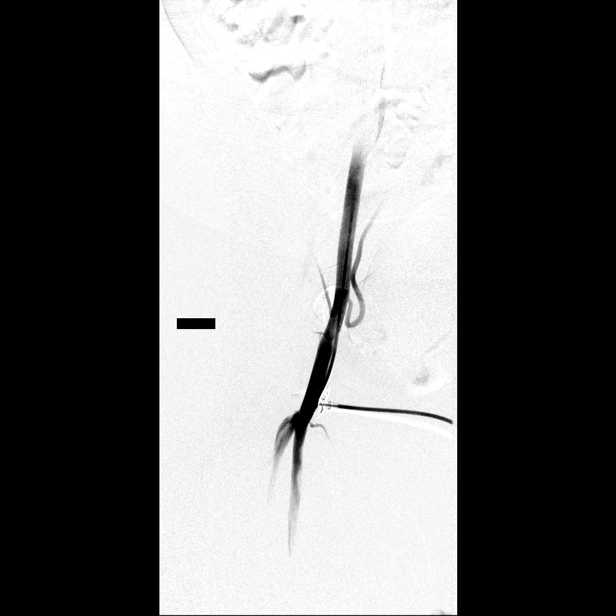
[im 43/139]
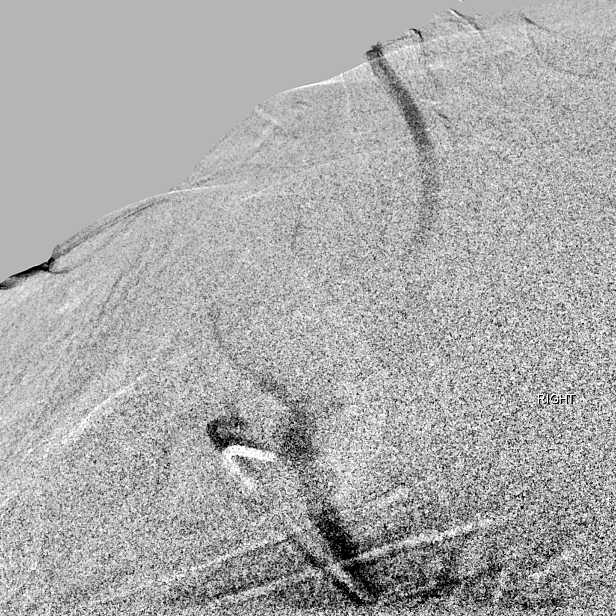
[im 55/139]
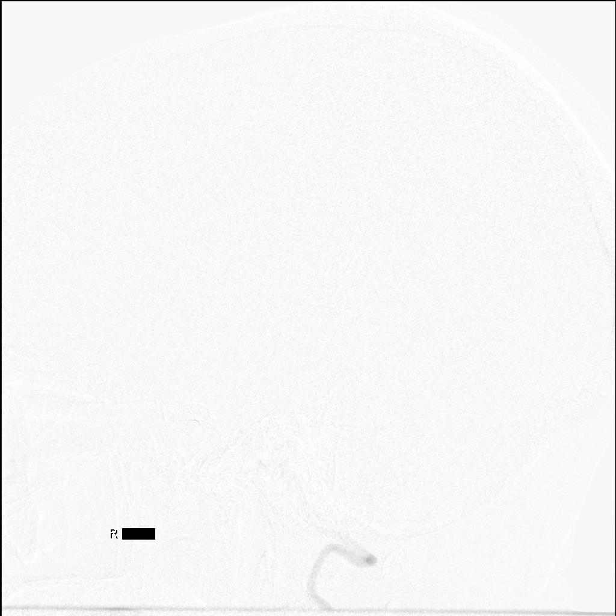
[im 67/139]
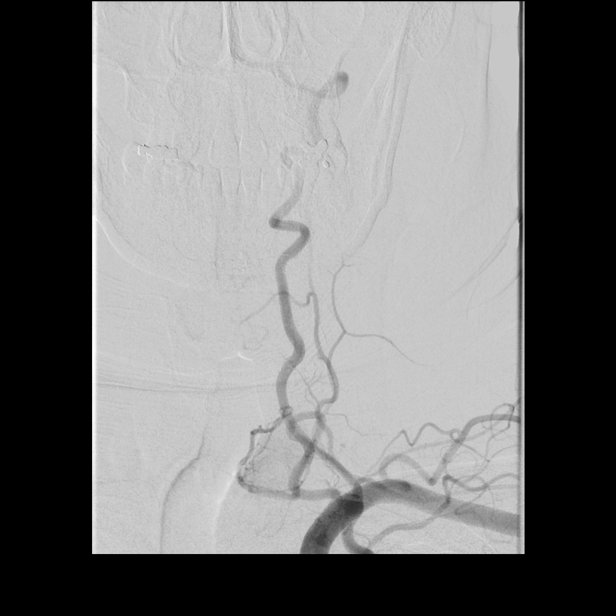
[im 79/139]
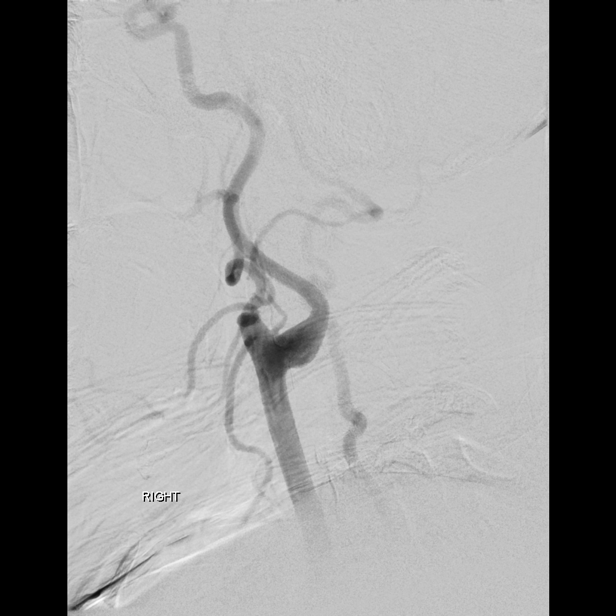
[im 91/139]
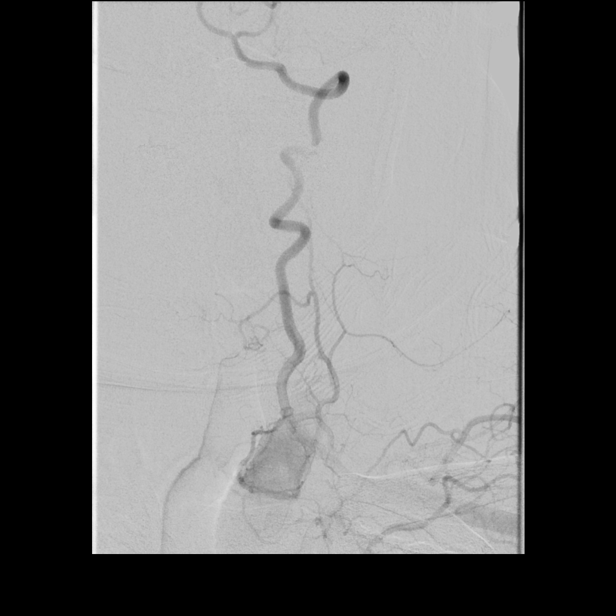
[im 103/139]
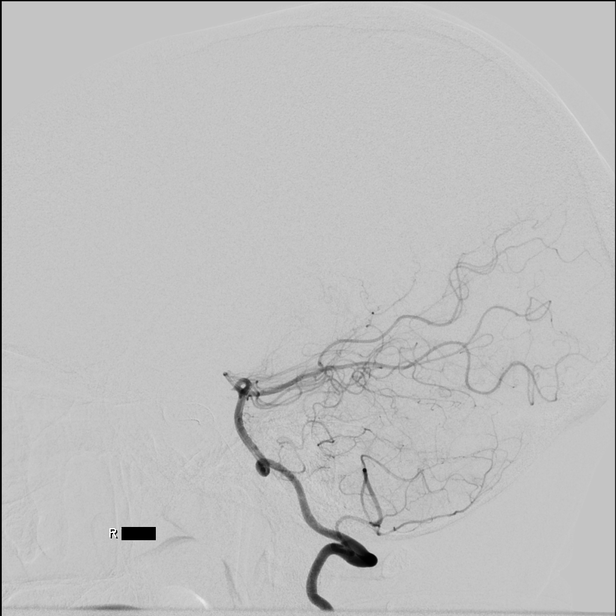
[im 115/139]
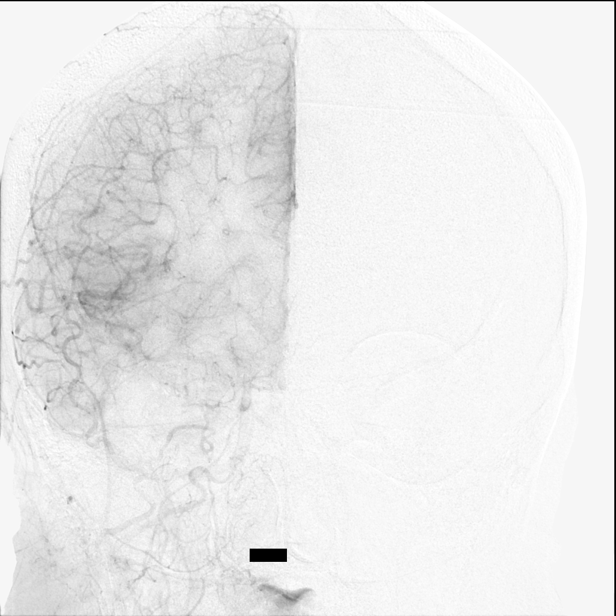
[im 127/139]
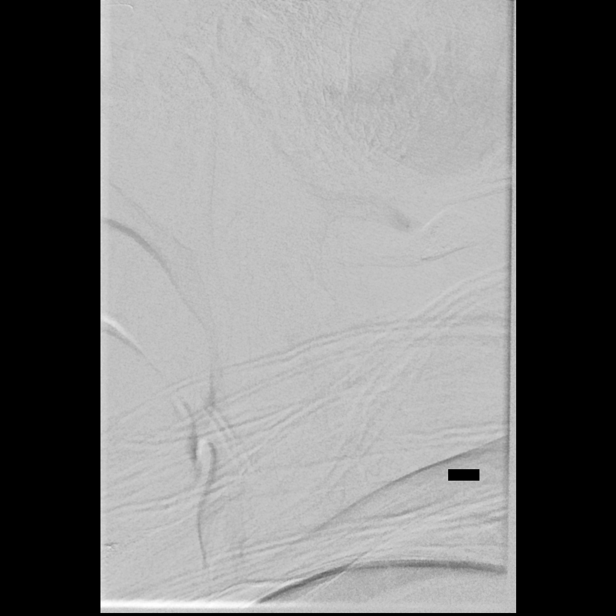
[im 139/139]
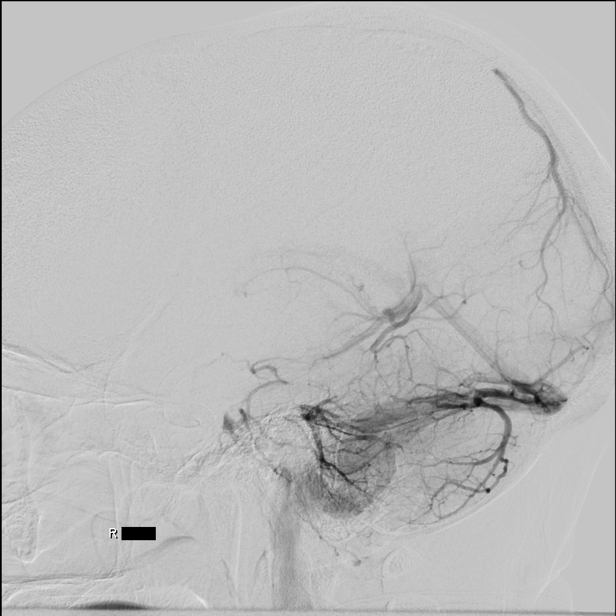

[12 of 24 positions shown; findings below may reference images not displayed]

MEDICATIONS:
Heparin 1,500 units IV; no antibiotic was administered within 1 hour
of the procedure.

ANESTHESIA/SEDATION:
Versed 1 mg IV; Fentanyl 75 mcg IV.

Moderate Sedation Time:  50 minutes.

The patient was continuously monitored during the procedure by the
interventional radiology nurse under my direct supervision.

CONTRAST:  Isovue 300 approximately 90 mL.

FLUOROSCOPY TIME:  Fluoroscopy Time: 12 minutes 18 seconds (832
mGy).

COMPLICATIONS:
None immediate.
The right groin was prepped and draped in the usual sterile fashion.
Thereafter using modified Seldinger technique, transfemoral access
into the right common femoral artery was obtained without
difficulty. Over a 0.035 inch guidewire, a 5 French Pinnacle sheath
was inserted. Through this, and also over 0.035 inch guidewire, a 5
French JB 1 catheter was advanced to the aortic arch region and
selectively positioned in the right common carotid artery, , the
right vertebral artery, the left common carotid artery and the left
vertebral artery. Also performed was a 3D rotational arteriogram of
the right anterior circulation injected through the right common
carotid artery. Reformation of the data was then performed on a
separate workstation.
FINDINGS: The right vertebral artery origin is widely patent.

The vessel is seen to opacify normally to the cranial skull base
with mild tortuosity in the cervical segment.

The right vertebrobasilar junction and the right posterior-inferior
cerebellar artery demonstrate normal opacification into the
capillary and venous phases.

There mild to moderate tortuosity of the distal right
vertebrobasilar junction, with wide patency of the basilar artery,
the posterior cerebral arteries, the superior cerebellar arteries
and the anterior-inferior cerebellar arteries into the capillary and
venous phases. Non-opacified blood is seen in the proximal basilar
artery from the contralateral vertebral artery.

The left common carotid arteriogram demonstrates the left external
carotid artery and its major branches to be widely patent.

The left internal carotid artery at the bulb to the cranial skull
base opacifies normally.

The petrous, cavernous and supraclinoid segments are widely patent.

The previously positioned pipeline flow diverter in the supraclinoid
left ICA demonstrates wide patency without evidence of the
previously treated ruptured blister aneurysm along the posterior
wall.

Patency of the right ophthalmic artery and of the left posterior
communicating artery is noted.

The left middle cerebral artery and the left anterior cerebral
artery opacify normally into the capillary and venous phases.

The left vertebral artery origin is widely patent.

The vessel is seen to opacify to the cranial skull base. There is
moderate tortuosity of the vessel in the mid cervical portion.

However, there is no evidence of kinking or occlusions noted.

The left vertebrobasilar junction and the left posterior-inferior
cerebellar artery demonstrate normal opacification.

Distal to this there is normal opacification of the left
vertebrobasilar junction with opacified portion of the basilar
artery to be widely patent.

The right common carotid arteriogram demonstrates the right external
carotid artery and its major branches to be widely patent.

The right internal carotid artery at the bulb to the cranial skull
base opacifies normally.

The petrous, cavernous and supraclinoid segments demonstrate wide
patency.

A small focal outpouching is seen proximal to the right posterior
communicating artery. The right middle cerebral artery and the right
anterior cerebral artery opacify normally into the capillary and
venous phases.

A 3D rotational arteriogram perfor[REDACTED]ed over the right
anterior circulation with subsequent reconstruction demonstrates the
presence of a 1.82 mm x 1.23 mm saccular outpouching from the
posteromedial wall of the right internal carotid artery between the
origins of the ophthalmic artery, and of the right posterior
communicating artery distally.
IMPRESSION: A 1.82 x 1.23 mm saccular blister aneurysm arising from the
posteromedial wall of the right internal carotid artery
intracranially between the origin of the ophthalmic artery, and the
right posterior communicating artery.

Wide patency of the previously positioned pipeline flow diverter
device extending into the left internal carotid artery supraclinoid
segment without angiographic evidence of the previously ruptured
blister aneurysm arising in the left internal carotid artery
supraclinoid segment.

PLAN:
Findings of the angiogram were reviewed with the patient.

## 2019-11-27 IMAGING — CT CT ANGIO NECK
2 of 12 series · 6 of 33 positions shown · IV contrast (iopamidol)
Comparison: Prior arteriogram from 05/22/2017.

CLINICAL DATA: Initial evaluation for acute neck pain with
dizziness. History of aneurysm.

EXAM:
CT ANGIOGRAPHY HEAD AND NECK
TECHNIQUE: Multidetector CT imaging of the head and neck was performed using
the standard protocol during bolus administration of intravenous
contrast. Multiplanar CT image reconstructions and MIPs were
obtained to evaluate the vascular anatomy. Carotid stenosis
measurements (when applicable) are obtained utilizing NASCET
criteria, using the distal internal carotid diameter as the
denominator.
CONTRAST:  100mL FS1XWD-KAN IOPAMIDOL (FS1XWD-KAN) INJECTION 76%

[Series 11: cta head neck thins · axial · 0.34mm/px · z∈[-196,-30]mm · 3 of 662 slices shown]
[im 166/662  soft-tissue]
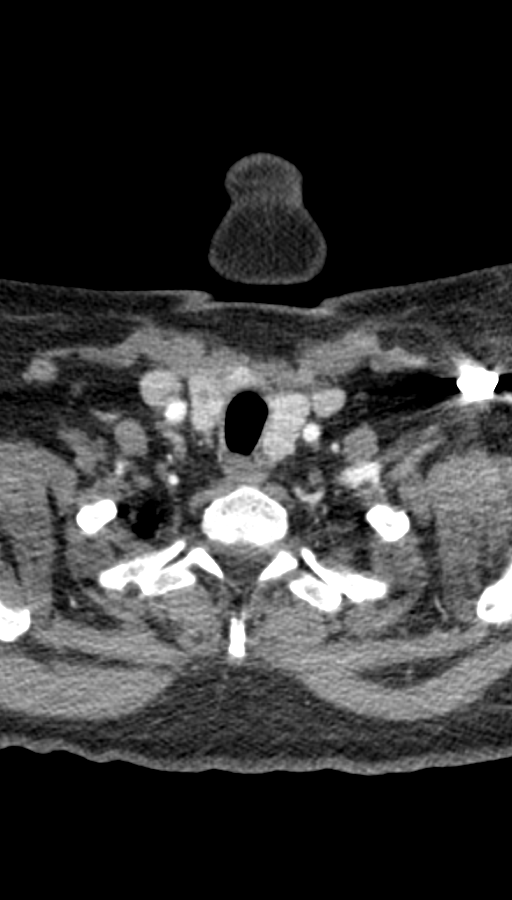
[im 331/662  soft-tissue]
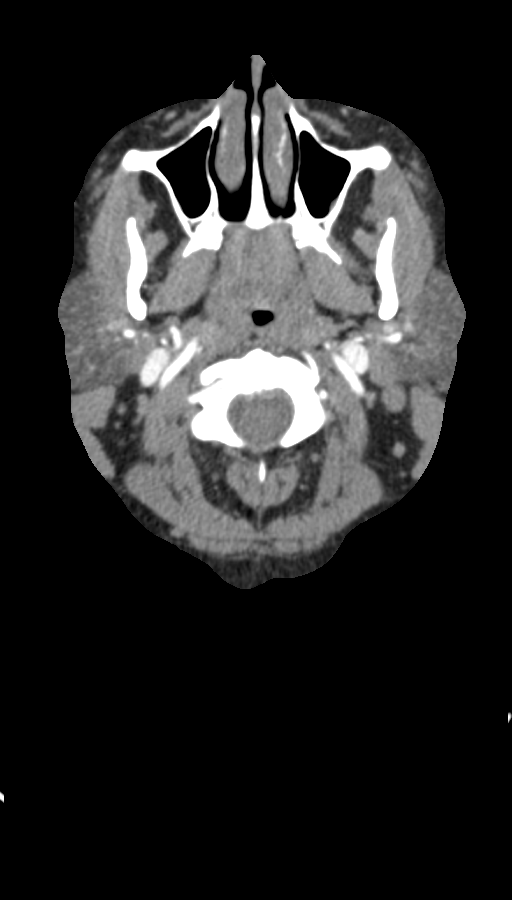
[im 496/662  soft-tissue]
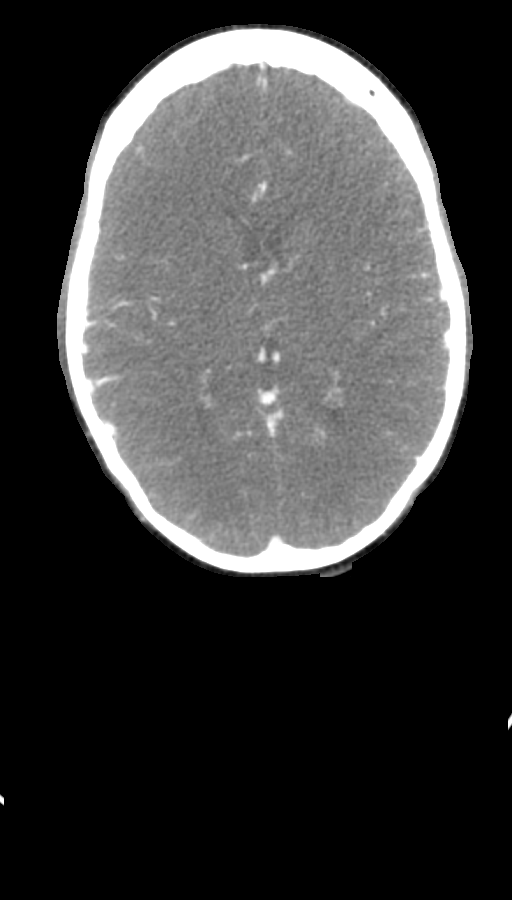

[Series 13: coronal thin · axial · 0.34mm/px · z∈[-327,+16]mm · 3 of 353 slices shown]
[im 1/353  soft-tissue]
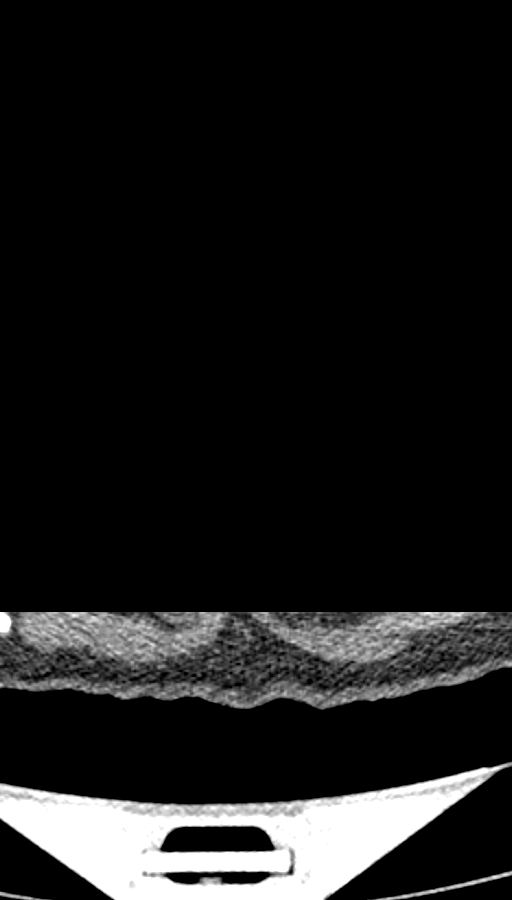
[im 177/353  bone]
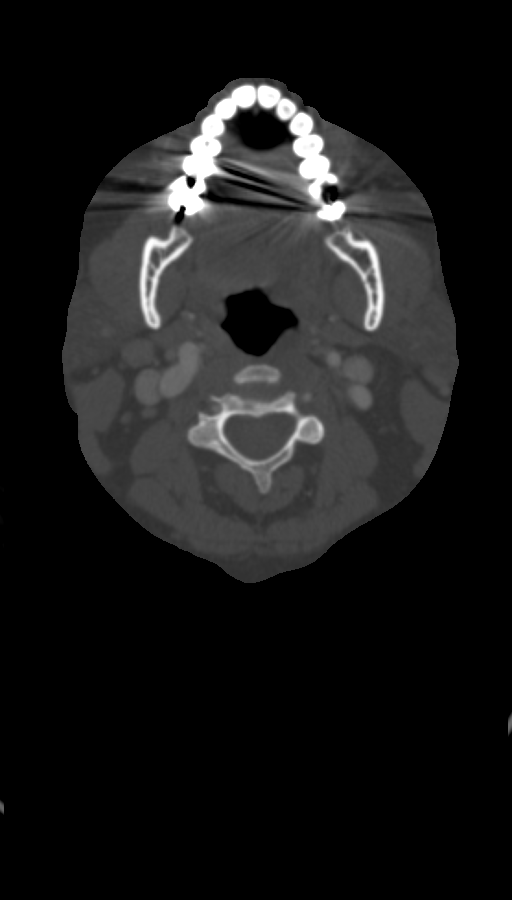
[im 353/353  soft-tissue]
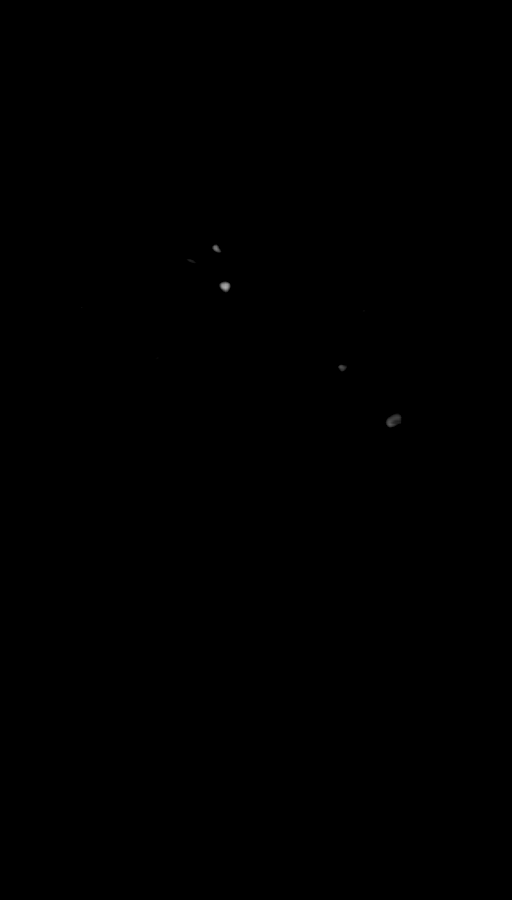

[6 of 33 positions shown; findings below may reference images not displayed]

FINDINGS: Brain: Cerebral volume within normal limits for patient age.

No evidence for acute intracranial hemorrhage. No findings to
suggest acute large vessel territory infarct. No mass lesion,
midline shift, or mass effect. Ventricles are normal in size without
evidence for hydrocephalus. No extra-axial fluid collection
identified.

Vascular: No hyperdense vessel. Metallic stent noted at the
supraclinoid left ICA.

Skull: Scalp soft tissues demonstrate no acute abnormality.
Calvarium intact.

Sinuses/Orbits: Globes and orbital soft tissues within normal
limits.

Visualized paranasal sinuses are clear. No mastoid effusion.

CTA NECK FINDINGS

Aortic arch: Visualized aortic arch of normal caliber with normal
branch pattern. No flow-limiting stenosis about the origin of the
great vessels. Visualized subclavian arteries widely patent.

Right carotid system: Right common and internal carotid arteries
widely patent without stenosis, dissection, or occlusion. No
atheromatous narrowing about the right carotid bifurcation.

Left carotid system: Left common and internal carotid arteries
widely patent without stenosis, dissection, or occlusion. No
atheromatous narrowing about the left carotid bifurcation.

Vertebral arteries: Both of the vertebral arteries arise from the
subclavian arteries and are widely patent to the skull base without
stenosis, dissection, or occlusion.

Skeleton: No acute osseous abnormality. No worrisome lytic or
blastic osseous lesions.

Other neck: No acute soft tissue abnormality within the neck.
Salivary glands are normal. 9 mm left thyroid nodule noted, of
doubtful clinical significance. No adenopathy.

Upper chest: Visualized upper mediastinum demonstrates no acute
abnormality. Substernal extension of the thyroid on the left noted.
Partially visualized lungs are clear. Mild paraseptal emphysema
noted.

Review of the MIP images confirms the above findings

CTA HEAD FINDINGS

Anterior circulation: Petrous segments patent bilaterally. Pipeline
stent in place within the cavernous/supraclinoid left ICA without
complication. Patent flow seen through the stent itself. Left ICA
terminus widely patent. Cavernous and supraclinoid right ICA widely
patent without stenosis. Tiny saccular aneurysm measuring
approximately 1.5 x 2 mm seen extending from the medial aspect of
the cavernous right ICA (series 11, image 235), grossly stable from
previous. Right ICA terminus widely patent. A1 segments widely
patent. Patent anterior communicating artery. Anterior cerebral
arteries widely patent to their distal aspects without stenosis. M1
segments widely patent bilaterally. Normal MCA bifurcations. No
proximal M2 occlusion. Distal MCA branches well opacified and
symmetric.

Posterior circulation: Vertebral arteries patent to the
vertebrobasilar junction without stenosis. Posterior inferior
cerebral arteries patent bilaterally. Basilar artery somewhat
tortuous but widely patent to its distal aspect. Superior cerebral
arteries patent bilaterally. Hypoplastic left P1 segment. PCAs
widely patent to their distal aspects without stenosis.

Venous sinuses: Patent.

Anatomic variants: None significant.

Delayed phase: No abnormal enhancement.

Review of the MIP images confirms the above findings
IMPRESSION: 1. Negative CTA of the head and neck. No acute vascular abnormality
identified. No large vessel occlusion. No high-grade or
flow-limiting stenosis.
2. Continued patency of pipeline stent position within the
cavernous/supraclinoid left ICA. No recurrent aneurysm identified.
3. Stable 1.5 x 2 mm cavernous right ICA aneurysm.

## 2019-12-22 DIAGNOSIS — U071 COVID-19: Secondary | ICD-10-CM | POA: Diagnosis not present

## 2019-12-22 DIAGNOSIS — Z20822 Contact with and (suspected) exposure to covid-19: Secondary | ICD-10-CM | POA: Diagnosis not present

## 2019-12-28 DIAGNOSIS — Z20828 Contact with and (suspected) exposure to other viral communicable diseases: Secondary | ICD-10-CM | POA: Diagnosis not present

## 2020-01-09 DIAGNOSIS — Z20822 Contact with and (suspected) exposure to covid-19: Secondary | ICD-10-CM | POA: Diagnosis not present

## 2020-01-09 DIAGNOSIS — Z03818 Encounter for observation for suspected exposure to other biological agents ruled out: Secondary | ICD-10-CM | POA: Diagnosis not present

## 2020-02-16 DIAGNOSIS — J45990 Exercise induced bronchospasm: Secondary | ICD-10-CM | POA: Diagnosis not present

## 2020-02-16 DIAGNOSIS — I10 Essential (primary) hypertension: Secondary | ICD-10-CM | POA: Diagnosis not present

## 2020-02-16 DIAGNOSIS — Z2821 Immunization not carried out because of patient refusal: Secondary | ICD-10-CM | POA: Diagnosis not present

## 2020-02-16 DIAGNOSIS — E782 Mixed hyperlipidemia: Secondary | ICD-10-CM | POA: Diagnosis not present

## 2020-02-16 DIAGNOSIS — J45909 Unspecified asthma, uncomplicated: Secondary | ICD-10-CM | POA: Diagnosis not present

## 2020-02-28 DIAGNOSIS — Z20822 Contact with and (suspected) exposure to covid-19: Secondary | ICD-10-CM | POA: Diagnosis not present

## 2020-02-28 DIAGNOSIS — Z03818 Encounter for observation for suspected exposure to other biological agents ruled out: Secondary | ICD-10-CM | POA: Diagnosis not present

## 2020-03-09 DIAGNOSIS — E782 Mixed hyperlipidemia: Secondary | ICD-10-CM | POA: Diagnosis not present

## 2020-03-09 DIAGNOSIS — I10 Essential (primary) hypertension: Secondary | ICD-10-CM | POA: Diagnosis not present

## 2020-03-09 DIAGNOSIS — E039 Hypothyroidism, unspecified: Secondary | ICD-10-CM | POA: Diagnosis not present

## 2020-03-11 DIAGNOSIS — E039 Hypothyroidism, unspecified: Secondary | ICD-10-CM | POA: Diagnosis not present

## 2020-03-11 DIAGNOSIS — E782 Mixed hyperlipidemia: Secondary | ICD-10-CM | POA: Diagnosis not present

## 2020-03-11 DIAGNOSIS — I10 Essential (primary) hypertension: Secondary | ICD-10-CM | POA: Diagnosis not present

## 2020-03-11 DIAGNOSIS — J45990 Exercise induced bronchospasm: Secondary | ICD-10-CM | POA: Diagnosis not present

## 2020-03-19 DIAGNOSIS — I10 Essential (primary) hypertension: Secondary | ICD-10-CM | POA: Diagnosis not present

## 2020-03-19 DIAGNOSIS — E782 Mixed hyperlipidemia: Secondary | ICD-10-CM | POA: Diagnosis not present

## 2020-03-19 DIAGNOSIS — E663 Overweight: Secondary | ICD-10-CM | POA: Diagnosis not present

## 2020-05-07 DIAGNOSIS — Z20822 Contact with and (suspected) exposure to covid-19: Secondary | ICD-10-CM | POA: Diagnosis not present

## 2020-06-24 ENCOUNTER — Telehealth (HOSPITAL_COMMUNITY): Payer: Self-pay

## 2020-06-24 NOTE — Telephone Encounter (Signed)
Called to schedule cta head/neck, no answer, left vm. AW 

## 2020-08-24 ENCOUNTER — Other Ambulatory Visit (HOSPITAL_COMMUNITY): Payer: Self-pay | Admitting: Interventional Radiology

## 2020-08-24 DIAGNOSIS — I771 Stricture of artery: Secondary | ICD-10-CM

## 2020-08-30 ENCOUNTER — Ambulatory Visit (HOSPITAL_COMMUNITY)
Admission: RE | Admit: 2020-08-30 | Discharge: 2020-08-30 | Disposition: A | Discharge status: Home / Self Care | Payer: BC Managed Care – PPO | Source: Ambulatory Visit | Attending: Interventional Radiology | Admitting: Interventional Radiology

## 2020-08-30 ENCOUNTER — Encounter (HOSPITAL_COMMUNITY): Payer: Self-pay

## 2020-08-30 ENCOUNTER — Other Ambulatory Visit: Payer: Self-pay

## 2020-08-30 DIAGNOSIS — J9859 Other diseases of mediastinum, not elsewhere classified: Secondary | ICD-10-CM | POA: Diagnosis not present

## 2020-08-30 DIAGNOSIS — G450 Vertebro-basilar artery syndrome: Secondary | ICD-10-CM | POA: Diagnosis not present

## 2020-08-30 DIAGNOSIS — I771 Stricture of artery: Secondary | ICD-10-CM | POA: Insufficient documentation

## 2020-08-30 DIAGNOSIS — I671 Cerebral aneurysm, nonruptured: Secondary | ICD-10-CM | POA: Diagnosis not present

## 2020-08-30 DIAGNOSIS — E041 Nontoxic single thyroid nodule: Secondary | ICD-10-CM | POA: Diagnosis not present

## 2020-08-30 LAB — POCT I-STAT CREATININE: Creatinine, Ser: 0.8 mg/dL (ref 0.44–1.00)

## 2020-08-30 MED ORDER — IOHEXOL 350 MG/ML SOLN
100.0000 mL | Freq: Once | INTRAVENOUS | Status: AC | PRN
  Administered 2020-08-30: 100 mL via INTRAVENOUS

## 2020-08-31 ENCOUNTER — Telehealth (HOSPITAL_COMMUNITY): Payer: Self-pay

## 2020-08-31 NOTE — Telephone Encounter (Signed)
Called pt regarding recent imaging, no answer, left vm. AW  

## 2020-09-14 DIAGNOSIS — E782 Mixed hyperlipidemia: Secondary | ICD-10-CM | POA: Diagnosis not present

## 2020-09-14 DIAGNOSIS — Z0000 Encounter for general adult medical examination without abnormal findings: Secondary | ICD-10-CM | POA: Diagnosis not present

## 2020-09-20 DIAGNOSIS — J45909 Unspecified asthma, uncomplicated: Secondary | ICD-10-CM | POA: Diagnosis not present

## 2020-09-20 DIAGNOSIS — E782 Mixed hyperlipidemia: Secondary | ICD-10-CM | POA: Diagnosis not present

## 2020-09-20 DIAGNOSIS — J309 Allergic rhinitis, unspecified: Secondary | ICD-10-CM | POA: Diagnosis not present

## 2020-09-20 DIAGNOSIS — I10 Essential (primary) hypertension: Secondary | ICD-10-CM | POA: Diagnosis not present

## 2020-11-29 DIAGNOSIS — Z0000 Encounter for general adult medical examination without abnormal findings: Secondary | ICD-10-CM | POA: Diagnosis not present

## 2020-11-29 DIAGNOSIS — E782 Mixed hyperlipidemia: Secondary | ICD-10-CM | POA: Diagnosis not present

## 2020-12-01 DIAGNOSIS — Z0000 Encounter for general adult medical examination without abnormal findings: Secondary | ICD-10-CM | POA: Diagnosis not present

## 2020-12-01 DIAGNOSIS — I10 Essential (primary) hypertension: Secondary | ICD-10-CM | POA: Diagnosis not present

## 2020-12-01 DIAGNOSIS — Z113 Encounter for screening for infections with a predominantly sexual mode of transmission: Secondary | ICD-10-CM | POA: Diagnosis not present

## 2020-12-01 DIAGNOSIS — Z118 Encounter for screening for other infectious and parasitic diseases: Secondary | ICD-10-CM | POA: Diagnosis not present

## 2020-12-01 DIAGNOSIS — R739 Hyperglycemia, unspecified: Secondary | ICD-10-CM | POA: Diagnosis not present

## 2020-12-01 DIAGNOSIS — Z23 Encounter for immunization: Secondary | ICD-10-CM | POA: Diagnosis not present

## 2020-12-01 DIAGNOSIS — Z1151 Encounter for screening for human papillomavirus (HPV): Secondary | ICD-10-CM | POA: Diagnosis not present

## 2020-12-01 DIAGNOSIS — Z7721 Contact with and (suspected) exposure to potentially hazardous body fluids: Secondary | ICD-10-CM | POA: Diagnosis not present

## 2020-12-01 DIAGNOSIS — Z01419 Encounter for gynecological examination (general) (routine) without abnormal findings: Secondary | ICD-10-CM | POA: Diagnosis not present

## 2020-12-03 DIAGNOSIS — R894 Abnormal immunological findings in specimens from other organs, systems and tissues: Secondary | ICD-10-CM | POA: Diagnosis not present

## 2020-12-08 DIAGNOSIS — A599 Trichomoniasis, unspecified: Secondary | ICD-10-CM | POA: Diagnosis not present

## 2020-12-30 DIAGNOSIS — R894 Abnormal immunological findings in specimens from other organs, systems and tissues: Secondary | ICD-10-CM | POA: Diagnosis not present

## 2020-12-30 DIAGNOSIS — A599 Trichomoniasis, unspecified: Secondary | ICD-10-CM | POA: Diagnosis not present

## 2021-01-06 DIAGNOSIS — I10 Essential (primary) hypertension: Secondary | ICD-10-CM | POA: Diagnosis not present

## 2021-01-18 DIAGNOSIS — Z23 Encounter for immunization: Secondary | ICD-10-CM | POA: Diagnosis not present

## 2021-01-18 DIAGNOSIS — A599 Trichomoniasis, unspecified: Secondary | ICD-10-CM | POA: Diagnosis not present

## 2021-01-18 DIAGNOSIS — Z7185 Encounter for immunization safety counseling: Secondary | ICD-10-CM | POA: Diagnosis not present

## 2021-01-18 DIAGNOSIS — R894 Abnormal immunological findings in specimens from other organs, systems and tissues: Secondary | ICD-10-CM | POA: Diagnosis not present

## 2021-01-18 DIAGNOSIS — R928 Other abnormal and inconclusive findings on diagnostic imaging of breast: Secondary | ICD-10-CM | POA: Diagnosis not present

## 2021-01-18 DIAGNOSIS — L918 Other hypertrophic disorders of the skin: Secondary | ICD-10-CM | POA: Diagnosis not present

## 2021-02-05 DIAGNOSIS — Z7189 Other specified counseling: Secondary | ICD-10-CM | POA: Diagnosis not present

## 2021-02-05 DIAGNOSIS — I10 Essential (primary) hypertension: Secondary | ICD-10-CM | POA: Diagnosis not present

## 2021-03-08 DIAGNOSIS — I10 Essential (primary) hypertension: Secondary | ICD-10-CM | POA: Diagnosis not present

## 2021-03-08 DIAGNOSIS — Z7189 Other specified counseling: Secondary | ICD-10-CM | POA: Diagnosis not present

## 2021-04-07 DIAGNOSIS — Z7189 Other specified counseling: Secondary | ICD-10-CM | POA: Diagnosis not present

## 2021-04-07 DIAGNOSIS — I10 Essential (primary) hypertension: Secondary | ICD-10-CM | POA: Diagnosis not present

## 2021-05-08 DIAGNOSIS — I10 Essential (primary) hypertension: Secondary | ICD-10-CM | POA: Diagnosis not present

## 2021-06-06 DIAGNOSIS — Z23 Encounter for immunization: Secondary | ICD-10-CM | POA: Diagnosis not present

## 2021-06-08 DIAGNOSIS — I10 Essential (primary) hypertension: Secondary | ICD-10-CM | POA: Diagnosis not present

## 2021-06-17 DIAGNOSIS — E119 Type 2 diabetes mellitus without complications: Secondary | ICD-10-CM | POA: Diagnosis not present

## 2021-06-17 DIAGNOSIS — E782 Mixed hyperlipidemia: Secondary | ICD-10-CM | POA: Diagnosis not present

## 2021-06-17 DIAGNOSIS — I10 Essential (primary) hypertension: Secondary | ICD-10-CM | POA: Diagnosis not present

## 2021-06-20 DIAGNOSIS — E1121 Type 2 diabetes mellitus with diabetic nephropathy: Secondary | ICD-10-CM | POA: Diagnosis not present

## 2021-06-20 DIAGNOSIS — E782 Mixed hyperlipidemia: Secondary | ICD-10-CM | POA: Diagnosis not present

## 2021-06-20 DIAGNOSIS — E1165 Type 2 diabetes mellitus with hyperglycemia: Secondary | ICD-10-CM | POA: Diagnosis not present

## 2021-06-20 DIAGNOSIS — I10 Essential (primary) hypertension: Secondary | ICD-10-CM | POA: Diagnosis not present

## 2021-07-08 DIAGNOSIS — I10 Essential (primary) hypertension: Secondary | ICD-10-CM | POA: Diagnosis not present

## 2021-09-26 ENCOUNTER — Other Ambulatory Visit (HOSPITAL_COMMUNITY): Payer: Self-pay | Admitting: Interventional Radiology

## 2021-09-26 DIAGNOSIS — I671 Cerebral aneurysm, nonruptured: Secondary | ICD-10-CM

## 2021-10-20 ENCOUNTER — Ambulatory Visit (HOSPITAL_COMMUNITY): Payer: BC Managed Care – PPO

## 2021-11-07 ENCOUNTER — Ambulatory Visit (HOSPITAL_COMMUNITY)
Admission: RE | Admit: 2021-11-07 | Discharge: 2021-11-07 | Disposition: A | Discharge status: Home / Self Care | Payer: BC Managed Care – PPO | Source: Ambulatory Visit | Attending: Interventional Radiology | Admitting: Interventional Radiology

## 2021-11-07 DIAGNOSIS — I6523 Occlusion and stenosis of bilateral carotid arteries: Secondary | ICD-10-CM | POA: Diagnosis not present

## 2021-11-07 DIAGNOSIS — E042 Nontoxic multinodular goiter: Secondary | ICD-10-CM | POA: Diagnosis not present

## 2021-11-07 DIAGNOSIS — I671 Cerebral aneurysm, nonruptured: Secondary | ICD-10-CM | POA: Diagnosis not present

## 2021-11-07 DIAGNOSIS — I639 Cerebral infarction, unspecified: Secondary | ICD-10-CM | POA: Diagnosis not present

## 2021-11-07 DIAGNOSIS — I771 Stricture of artery: Secondary | ICD-10-CM | POA: Diagnosis not present

## 2021-11-07 MED ORDER — SODIUM CHLORIDE (PF) 0.9 % IJ SOLN
INTRAMUSCULAR | Status: AC
  Filled 2021-11-07: qty 50

## 2021-11-07 MED ORDER — IOHEXOL 350 MG/ML SOLN
75.0000 mL | Freq: Once | INTRAVENOUS | Status: AC | PRN
  Administered 2021-11-07: 75 mL via INTRAVENOUS

## 2021-11-14 ENCOUNTER — Telehealth (HOSPITAL_COMMUNITY): Payer: Self-pay

## 2021-11-14 NOTE — Telephone Encounter (Signed)
-----   Message from [name], [name] sent at 11/11/2021  9:38 AM EDT ----- Dev would like Ms. Hackman to be scheduled for a 2 year CTA f/u.  She can decrease ASA from 325mg  to 81mg  in the meantime, if she hasn't already.   Anita Rhodes

## 2021-11-14 NOTE — Telephone Encounter (Signed)
Pt agreed to f/u in 2 years with a cta head and neck. She will continue on ASA 81mg  per Dr. . AW

## 2021-12-22 DIAGNOSIS — I10 Essential (primary) hypertension: Secondary | ICD-10-CM | POA: Diagnosis not present

## 2022-01-06 DIAGNOSIS — Z114 Encounter for screening for human immunodeficiency virus [HIV]: Secondary | ICD-10-CM | POA: Diagnosis not present

## 2022-01-06 DIAGNOSIS — Z0000 Encounter for general adult medical examination without abnormal findings: Secondary | ICD-10-CM | POA: Diagnosis not present

## 2022-01-10 DIAGNOSIS — Z0000 Encounter for general adult medical examination without abnormal findings: Secondary | ICD-10-CM | POA: Diagnosis not present

## 2022-02-13 DIAGNOSIS — Z1231 Encounter for screening mammogram for malignant neoplasm of breast: Secondary | ICD-10-CM | POA: Diagnosis not present

## 2022-08-10 DIAGNOSIS — R7303 Prediabetes: Secondary | ICD-10-CM | POA: Diagnosis not present

## 2022-08-10 DIAGNOSIS — E210 Primary hyperparathyroidism: Secondary | ICD-10-CM | POA: Diagnosis not present

## 2022-08-14 DIAGNOSIS — E559 Vitamin D deficiency, unspecified: Secondary | ICD-10-CM | POA: Diagnosis not present

## 2022-08-14 DIAGNOSIS — I10 Essential (primary) hypertension: Secondary | ICD-10-CM | POA: Diagnosis not present

## 2022-08-14 DIAGNOSIS — R7303 Prediabetes: Secondary | ICD-10-CM | POA: Diagnosis not present

## 2023-02-19 DIAGNOSIS — Z1231 Encounter for screening mammogram for malignant neoplasm of breast: Secondary | ICD-10-CM | POA: Diagnosis not present

## 2023-02-25 ENCOUNTER — Encounter (HOSPITAL_COMMUNITY): Payer: Self-pay

## 2023-02-25 ENCOUNTER — Emergency Department (HOSPITAL_COMMUNITY): Payer: BC Managed Care – PPO

## 2023-02-25 ENCOUNTER — Other Ambulatory Visit: Payer: Self-pay

## 2023-02-25 ENCOUNTER — Emergency Department (HOSPITAL_COMMUNITY)
Admission: EM | Admit: 2023-02-25 | Discharge: 2023-02-25 | Disposition: A | Discharge status: Home / Self Care | Payer: BC Managed Care – PPO | Attending: Emergency Medicine | Admitting: Emergency Medicine

## 2023-02-25 DIAGNOSIS — E119 Type 2 diabetes mellitus without complications: Secondary | ICD-10-CM | POA: Insufficient documentation

## 2023-02-25 DIAGNOSIS — Z7984 Long term (current) use of oral hypoglycemic drugs: Secondary | ICD-10-CM | POA: Diagnosis not present

## 2023-02-25 DIAGNOSIS — R519 Headache, unspecified: Secondary | ICD-10-CM | POA: Insufficient documentation

## 2023-02-25 DIAGNOSIS — Z79899 Other long term (current) drug therapy: Secondary | ICD-10-CM | POA: Insufficient documentation

## 2023-02-25 DIAGNOSIS — Z7982 Long term (current) use of aspirin: Secondary | ICD-10-CM | POA: Diagnosis not present

## 2023-02-25 DIAGNOSIS — J45909 Unspecified asthma, uncomplicated: Secondary | ICD-10-CM | POA: Diagnosis not present

## 2023-02-25 DIAGNOSIS — J432 Centrilobular emphysema: Secondary | ICD-10-CM | POA: Diagnosis not present

## 2023-02-25 DIAGNOSIS — S199XXA Unspecified injury of neck, initial encounter: Secondary | ICD-10-CM | POA: Diagnosis not present

## 2023-02-25 DIAGNOSIS — E049 Nontoxic goiter, unspecified: Secondary | ICD-10-CM | POA: Diagnosis not present

## 2023-02-25 DIAGNOSIS — R55 Syncope and collapse: Secondary | ICD-10-CM | POA: Insufficient documentation

## 2023-02-25 LAB — URINALYSIS, ROUTINE W REFLEX MICROSCOPIC
Bilirubin Urine: NEGATIVE
Glucose, UA: NEGATIVE mg/dL
Hgb urine dipstick: NEGATIVE
Ketones, ur: NEGATIVE mg/dL
Nitrite: POSITIVE — AB
Protein, ur: NEGATIVE mg/dL
Specific Gravity, Urine: 1.02 (ref 1.005–1.030)
pH: 5 (ref 5.0–8.0)

## 2023-02-25 LAB — COMPREHENSIVE METABOLIC PANEL
ALT: 42 U/L (ref 0–44)
AST: 25 U/L (ref 15–41)
Albumin: 4.3 g/dL (ref 3.5–5.0)
Alkaline Phosphatase: 74 U/L (ref 38–126)
Anion gap: 9 (ref 5–15)
BUN: 15 mg/dL (ref 6–20)
CO2: 26 mmol/L (ref 22–32)
Calcium: 10.4 mg/dL — ABNORMAL HIGH (ref 8.9–10.3)
Chloride: 103 mmol/L (ref 98–111)
Creatinine, Ser: 0.77 mg/dL (ref 0.44–1.00)
GFR, Estimated: >60 mL/min (ref >60)
Glucose, Bld: 97 mg/dL (ref 70–99)
Potassium: 3.7 mmol/L (ref 3.5–5.1)
Sodium: 138 mmol/L (ref 135–145)
Total Bilirubin: 0.7 mg/dL (ref 0.3–1.2)
Total Protein: 7.8 g/dL (ref 6.5–8.1)

## 2023-02-25 LAB — CBC WITH DIFFERENTIAL/PLATELET
Abs Immature Granulocytes: 0.02 10*3/uL (ref 0.00–0.07)
Basophils Absolute: 0 10*3/uL (ref 0.0–0.1)
Basophils Relative: 1 %
Eosinophils Absolute: 0.3 10*3/uL (ref 0.0–0.5)
Eosinophils Relative: 5 %
HCT: 40.8 % (ref 36.0–46.0)
Hemoglobin: 13.2 g/dL (ref 12.0–15.0)
Immature Granulocytes: 0 %
Lymphocytes Relative: 42 %
Lymphs Abs: 2.4 10*3/uL (ref 0.7–4.0)
MCH: 30.4 pg (ref 26.0–34.0)
MCHC: 32.4 g/dL (ref 30.0–36.0)
MCV: 94 fL (ref 80.0–100.0)
Monocytes Absolute: 0.4 10*3/uL (ref 0.1–1.0)
Monocytes Relative: 7 %
Neutro Abs: 2.6 10*3/uL (ref 1.7–7.7)
Neutrophils Relative %: 45 %
Platelets: 253 10*3/uL (ref 150–400)
RBC: 4.34 MIL/uL (ref 3.87–5.11)
RDW: 12.8 % (ref 11.5–15.5)
WBC: 5.7 10*3/uL (ref 4.0–10.5)
nRBC: 0 % (ref 0.0–0.2)

## 2023-02-25 LAB — CBG MONITORING, ED: Glucose-Capillary: 89 mg/dL (ref 70–99)

## 2023-02-25 LAB — PREGNANCY, URINE: Preg Test, Ur: NEGATIVE

## 2023-02-25 MED ORDER — IOHEXOL 350 MG/ML SOLN
75.0000 mL | Freq: Once | INTRAVENOUS | Status: AC | PRN
  Administered 2023-02-25: 75 mL via INTRAVENOUS

## 2023-02-25 NOTE — ED Triage Notes (Addendum)
Patient is here for evaluation after having a syncopal episode on Friday evening. Reports that she woke up on the ground and had hit her forehead on a table. Positive LOC. Pt has history of brain aneurysm and wants to check her head. Pt reports steady headache since the fall, which is where her pain was when she had her aneurysm.

## 2023-02-25 NOTE — ED Notes (Signed)
IV access attempted x2 without success.

## 2023-02-25 NOTE — ED Notes (Addendum)
Pt not given anything to drink. Note placed on wrong pt.

## 2023-02-25 NOTE — ED Provider Notes (Signed)
[place] [facility] AT [facility] Provider Note   CSN: [id number] Arrival date & time: 02/25/23  1205     History {Add pertinent medical, surgical, social history, OB history to HPI:1} Chief Complaint  Patient presents with   Near Syncope    Anita Rhodes is a 47 y.o. female.  Patient states she had a syncopal episode on Friday.  Patient has a history of an aneurysm in her head that was repaired.  Patient also has diabetes and asthma.   Near Syncope       Home Medications Prior to Admission medications   Medication Sig Start Date End Date Taking? Authorizing Provider  albuterol (PROVENTIL HFA;VENTOLIN HFA) 108 (90 BASE) MCG/ACT inhaler Inhale 2 puffs into the lungs every 6 (six) hours as needed for wheezing or shortness of breath.   Yes [provider]  aspirin EC 81 MG tablet Take 81 mg by mouth daily. Swallow whole.   Yes [provider]  carvedilol (COREG) 25 MG tablet Take 25 mg by mouth 2 (two) times daily. 01/25/23  Yes [provider]  ezetimibe (ZETIA) 10 MG tablet Take 10 mg by mouth at bedtime. 01/25/23  Yes [provider]  metFORMIN (GLUCOPHAGE-XR) 500 MG 24 hr tablet Take 500 mg by mouth daily with breakfast. 02/10/23  Yes [provider]  Multiple Vitamin (MULTIVITAMIN WITH MINERALS) TABS tablet Take 1 tablet by mouth daily.   Yes [provider]  valsartan-hydrochlorothiazide (DIOVAN-HCT) 320-12.5 MG tablet Take 1 tablet by mouth every morning. 02/11/23  Yes [provider]  aspirin 325 MG tablet Take 1 tablet (325 mg total) by mouth daily with breakfast. Patient not taking: Reported on 02/25/2023 11/02/12   Colon Branch, MD  HYDROcodone-acetaminophen (NORCO/VICODIN) 5-325 MG tablet Take 1 tablet by mouth every 4 (four) hours as needed. Patient not taking: Reported on 06/11/2019 03/06/19   Ward, Layla Maw, DO  NIFEdipine (PROCARDIA XL/NIFEDICAL-XL) 90 MG 24 hr tablet Take 1  tablet (90 mg total) by mouth daily. Patient not taking: Reported on 02/25/2023 03/31/19   Leone Brand, NP  cetirizine (ZYRTEC) 10 MG tablet Take 1 tablet (10 mg total) by mouth daily. 10/25/17 03/05/19  Couture, Cortni S, PA-C  famotidine (PEPCID) 40 MG tablet Take 1 tablet (40 mg total) by mouth daily for 7 days. Patient not taking: Reported on 03/05/2019 10/25/17 03/05/19  Couture, Cortni S, PA-C      Allergies    Nifedipine and Rosuvastatin    Review of Systems   Review of Systems  Cardiovascular:  Positive for near-syncope.    Physical Exam Updated Vital Signs BP (!) 135/92   Pulse 88   Temp 98.6 F (37 C) (Oral)   Resp (!) 21   Ht 5\' 4"  (1.626 m)   Wt 76.2 kg   SpO2 100%   BMI 28.84 kg/m  Physical Exam  ED Results / Procedures / Treatments   Labs (all labs ordered are listed, but only abnormal results are displayed) Labs Reviewed  COMPREHENSIVE METABOLIC PANEL - Abnormal; Notable for the following components:      Result Value   Calcium 10.4 (*)    All other components within normal limits  URINALYSIS, ROUTINE W REFLEX MICROSCOPIC - Abnormal; Notable for the following components:   APPearance HAZY (*)    Nitrite POSITIVE (*)    Leukocytes,Ua TRACE (*)    Bacteria, UA RARE (*)    All other components within normal limits  CBC WITH DIFFERENTIAL/PLATELET  PREGNANCY, URINE  CBG MONITORING, ED    EKG None  Radiology CT ANGIO HEAD NECK W WO CM  Result Date: 02/25/2023 CLINICAL DATA:  Syncopal episode, stroke suspected EXAM: CT ANGIOGRAPHY HEAD AND NECK WITH AND WITHOUT CONTRAST TECHNIQUE: Multidetector CT imaging of the head and neck was performed using the standard protocol during bolus administration of intravenous contrast. Multiplanar CT image reconstructions and MIPs were obtained to evaluate the vascular anatomy. Carotid stenosis measurements (when applicable) are obtained utilizing NASCET criteria, using the distal internal carotid diameter as the  denominator. RADIATION DOSE REDUCTION: This exam was performed according to the departmental dose-optimization program which includes automated exposure control, adjustment of the mA and/or kV according to patient size and/or use of iterative reconstruction technique. CONTRAST:  75mL OMNIPAQUE IOHEXOL 350 MG/ML SOLN COMPARISON:  11/07/2021 CTA head and neck, correlation is made with 02/25/2023 CT head FINDINGS: CT HEAD FINDINGS For noncontrast findings, please see same day CT head. CTA NECK FINDINGS Aortic arch: Standard branching. Imaged portion shows no evidence of aneurysm or dissection. No significant stenosis of the major arch vessel origins. Right carotid system: No evidence of stenosis, dissection, or occlusion. Left carotid system: No evidence of stenosis, dissection, or occlusion. Vertebral arteries: No evidence of stenosis, dissection, or occlusion. Skeleton: No acute osseous abnormality. Degenerative changes in the cervical spine. Other neck: Redemonstrated enlargement of the left thyroid lobe with extension into the mediastinum, unchanged from the prior exam. No new soft tissue finding in the neck. Upper chest: No focal pulmonary opacity or pleural effusion. Centrilobular and paraseptal emphysema. Review of the MIP images confirms the above findings CTA HEAD FINDINGS Anterior circulation: Redemonstrated bilateral ICA pipeline stents, which appear unchanged from the prior exam. The stents remain patent without evidence of in stent stenosis. No significant stenosis. A1 segments patent. Normal anterior communicating artery. Anterior cerebral arteries are patent to their distal aspects without significant stenosis. No M1 stenosis or occlusion. MCA branches perfused to their distal aspects without significant stenosis. Posterior circulation: Vertebral arteries patent to the vertebrobasilar junction without significant stenosis. Posterior inferior cerebellar arteries patent proximally. Basilar patent to its  distal aspect without significant stenosis. Superior cerebellar arteries patent proximally. Patent P1 segments. Common origin of the left SCA and P1 segment. Near fetal origin of the left PCA left posterior communicating artery. PCAs perfused to their distal aspects without significant stenosis. Venous sinuses: As permitted by contrast timing, patent. Anatomic variants: No fetal origin of the left PCA. No evidence of aneurysm or vascular malformation. Review of the MIP images confirms the above findings IMPRESSION: 1. Redemonstrated bilateral ICA pipeline stents, which remain patent without evidence of in stent stenosis. 2. No intracranial large vessel occlusion or significant stenosis. 3. No hemodynamically significant stenosis in the neck. Electronically Signed   By: Wiliam Ke M.D.   On: 02/25/2023 19:39   CT HEAD WO CONTRAST  Result Date: 02/25/2023 CLINICAL DATA:  Syncope/presyncope, cerebrovascular cause suspected EXAM: CT HEAD WITHOUT CONTRAST TECHNIQUE: Contiguous axial images were obtained from the base of the skull through the vertex without intravenous contrast. RADIATION DOSE REDUCTION: This exam was performed according to the departmental dose-optimization program which includes automated exposure control, adjustment of the mA and/or kV according to patient size and/or use of iterative reconstruction technique. COMPARISON:  11/07/2021 FINDINGS: Brain: No evidence of acute infarction, hemorrhage, hydrocephalus, extra-axial collection or mass lesion/mass effect. Vascular: Atherosclerotic calcifications involving the large vessels of the skull base. No unexpected hyperdense vessel. Skull: Normal. Negative for fracture or  focal lesion. Sinuses/Orbits: No acute finding. Other: None. IMPRESSION: No acute intracranial findings. Electronically Signed   By: Duanne Guess D.O.   On: 02/25/2023 14:11   CT CERVICAL SPINE WO CONTRAST  Result Date: 02/25/2023 CLINICAL DATA:  Neck trauma, midline  tenderness (Age 51-64y) EXAM: CT CERVICAL SPINE WITHOUT CONTRAST TECHNIQUE: Multidetector CT imaging of the cervical spine was performed without intravenous contrast. Multiplanar CT image reconstructions were also generated. RADIATION DOSE REDUCTION: This exam was performed according to the departmental dose-optimization program which includes automated exposure control, adjustment of the mA and/or kV according to patient size and/or use of iterative reconstruction technique. COMPARISON:  01/21/2010 FINDINGS: Alignment: Facet joints are aligned without dislocation or traumatic listhesis. Dens and lateral masses are aligned. Smooth reversal of the cervical lordosis. Skull base and vertebrae: No acute fracture. No primary bone lesion or focal pathologic process. Soft tissues and spinal canal: No prevertebral fluid or swelling. No visible canal hematoma. Disc levels:  Within normal limits. Upper chest: Included lung apices are clear. Other: None. IMPRESSION: 1. No acute fracture or traumatic listhesis of the cervical spine. 2. Smooth reversal of the cervical lordosis, which may be positional or related to muscle spasm. Electronically Signed   By: Duanne Guess D.O.   On: 02/25/2023 14:09    Procedures Procedures  {Document cardiac monitor, telemetry assessment procedure when appropriate:1}  Medications Ordered in ED Medications  iohexol (OMNIPAQUE) 350 MG/ML injection 75 mL (75 mLs Intravenous Contrast Given 02/25/23 1736)    ED Course/ Medical Decision Making/ A&P   {   Click here for ABCD2, HEART and other calculatorsREFRESH Note before signing :1}                              Medical Decision Making Amount and/or Complexity of Data Reviewed Radiology: ordered.  Risk Prescription drug management.  Patient with syncopal episode.  Patient will follow-up with PCP  {Document critical care time when appropriate:1} {Document review of labs and clinical decision tools ie heart score, Chads2Vasc2  etc:1}  {Document your independent review of radiology images, and any outside records:1} {Document your discussion with family members, caretakers, and with consultants:1} {Document social determinants of health affecting pt's care:1} {Document your decision making why or why not admission, treatments were needed:1} Final Clinical Impression(s) / ED Diagnoses Final diagnoses:  Syncope and collapse    Rx / DC Orders ED Discharge Orders     None

## 2023-02-25 NOTE — Discharge Instructions (Signed)
Follow up with your md if any problems °

## 2023-02-25 NOTE — ED Provider Triage Note (Signed)
Emergency Medicine Provider Triage Evaluation Note  Anita Rhodes , a 47 y.o. female  was evaluated in triage.  Pt complains of syncopal episode.  States she was sitting down on her couch earlier today taking a break from packing up her house.  She stood up and then syncopized.  She fell forward.  Unsure how long she was unconscious for.  She has anterior head pain and seizure neck pain.  Denies prodromal chest pain or shortness of breath  Review of Systems  Positive: As above Negative: As above  Physical Exam  BP (!) 129/96 (BP Location: Left Arm)   Pulse 78   Temp 98.6 F (37 C) (Oral)   Resp 16   Ht 5\' 4"  (1.626 m)   Wt 76.2 kg   SpO2 100%   BMI 28.84 kg/m  Gen:   Awake, no distress   Resp:  Normal effort  MSK:   Moves extremities without difficulty  Other:  Midline C-spine TTP  Medical Decision Making  Medically screening exam initiated at 12:48 PM.  Appropriate orders placed.  Anita Rhodes was informed that the remainder of the evaluation will be completed by another provider, this initial triage assessment does not replace that evaluation, and the importance of remaining in the ED until their evaluation is complete.  Workup initiated   Michelle Piper, Cordelia Poche 02/25/23 1249

## 2023-03-16 DIAGNOSIS — E049 Nontoxic goiter, unspecified: Secondary | ICD-10-CM | POA: Diagnosis not present

## 2023-03-16 DIAGNOSIS — N390 Urinary tract infection, site not specified: Secondary | ICD-10-CM | POA: Diagnosis not present

## 2023-03-16 DIAGNOSIS — R7303 Prediabetes: Secondary | ICD-10-CM | POA: Diagnosis not present

## 2023-03-16 DIAGNOSIS — E559 Vitamin D deficiency, unspecified: Secondary | ICD-10-CM | POA: Diagnosis not present

## 2023-03-23 DIAGNOSIS — Z0000 Encounter for general adult medical examination without abnormal findings: Secondary | ICD-10-CM | POA: Diagnosis not present

## 2023-03-26 DIAGNOSIS — I10 Essential (primary) hypertension: Secondary | ICD-10-CM | POA: Diagnosis not present

## 2023-03-26 DIAGNOSIS — Z0000 Encounter for general adult medical examination without abnormal findings: Secondary | ICD-10-CM | POA: Diagnosis not present

## 2023-11-02 ENCOUNTER — Encounter (HOSPITAL_COMMUNITY): Payer: Self-pay | Admitting: Interventional Radiology

## 2023-11-26 DIAGNOSIS — I10 Essential (primary) hypertension: Secondary | ICD-10-CM | POA: Diagnosis not present

## 2023-11-26 DIAGNOSIS — A6000 Herpesviral infection of urogenital system, unspecified: Secondary | ICD-10-CM | POA: Diagnosis not present

## 2023-11-26 DIAGNOSIS — R7303 Prediabetes: Secondary | ICD-10-CM | POA: Diagnosis not present

## 2023-12-04 DIAGNOSIS — R894 Abnormal immunological findings in specimens from other organs, systems and tissues: Secondary | ICD-10-CM | POA: Diagnosis not present

## 2023-12-04 DIAGNOSIS — R739 Hyperglycemia, unspecified: Secondary | ICD-10-CM | POA: Diagnosis not present

## 2023-12-04 DIAGNOSIS — R5383 Other fatigue: Secondary | ICD-10-CM | POA: Diagnosis not present

## 2023-12-04 DIAGNOSIS — I10 Essential (primary) hypertension: Secondary | ICD-10-CM | POA: Diagnosis not present

## 2023-12-10 DIAGNOSIS — N951 Menopausal and female climacteric states: Secondary | ICD-10-CM | POA: Diagnosis not present

## 2023-12-10 DIAGNOSIS — R7303 Prediabetes: Secondary | ICD-10-CM | POA: Diagnosis not present

## 2023-12-10 DIAGNOSIS — N3000 Acute cystitis without hematuria: Secondary | ICD-10-CM | POA: Diagnosis not present

## 2023-12-10 DIAGNOSIS — Z1630 Resistance to unspecified antimicrobial drugs: Secondary | ICD-10-CM | POA: Diagnosis not present

## 2024-02-07 ENCOUNTER — Encounter: Payer: Self-pay | Admitting: *Deleted

## 2024-02-07 NOTE — Progress Notes (Signed)
 SHELBEY SPINDLER                                          MRN: [id number]   02/07/2024   The [facility] Quality Team Specialist reviewed this patient medical record for the purposes of chart review for care gap closure. The following were reviewed: chart review for care gap closure-kidney health evaluation for diabetes:eGFR  and uACR.    [facility] Quality Team

## 2024-04-02 DIAGNOSIS — Z79899 Other long term (current) drug therapy: Secondary | ICD-10-CM | POA: Diagnosis not present

## 2024-04-02 DIAGNOSIS — E559 Vitamin D deficiency, unspecified: Secondary | ICD-10-CM | POA: Diagnosis not present

## 2024-04-02 DIAGNOSIS — I10 Essential (primary) hypertension: Secondary | ICD-10-CM | POA: Diagnosis not present

## 2024-04-02 DIAGNOSIS — R7303 Prediabetes: Secondary | ICD-10-CM | POA: Diagnosis not present

## 2024-04-02 DIAGNOSIS — Z1231 Encounter for screening mammogram for malignant neoplasm of breast: Secondary | ICD-10-CM | POA: Diagnosis not present

## 2024-04-02 DIAGNOSIS — Z8639 Personal history of other endocrine, nutritional and metabolic disease: Secondary | ICD-10-CM | POA: Diagnosis not present

## 2024-04-02 DIAGNOSIS — E782 Mixed hyperlipidemia: Secondary | ICD-10-CM | POA: Diagnosis not present

## 2024-04-07 DIAGNOSIS — Z0000 Encounter for general adult medical examination without abnormal findings: Secondary | ICD-10-CM | POA: Diagnosis not present

## 2024-04-09 DIAGNOSIS — E782 Mixed hyperlipidemia: Secondary | ICD-10-CM | POA: Diagnosis not present

## 2024-04-09 DIAGNOSIS — R7303 Prediabetes: Secondary | ICD-10-CM | POA: Diagnosis not present
